# Patient Record
Sex: Male | Born: 1935 | Race: White | Hispanic: No | Marital: Married | Smoking: Current every day smoker
Health system: Southern US, Community
[De-identification: ages and names within clinical notes are randomized; demographics above are authoritative.]

## PROBLEM LIST (undated history)

## (undated) DIAGNOSIS — Z9289 Personal history of other medical treatment: Secondary | ICD-10-CM

## (undated) DIAGNOSIS — K649 Unspecified hemorrhoids: Secondary | ICD-10-CM

## (undated) DIAGNOSIS — L409 Psoriasis, unspecified: Secondary | ICD-10-CM

## (undated) DIAGNOSIS — D638 Anemia in other chronic diseases classified elsewhere: Secondary | ICD-10-CM

## (undated) DIAGNOSIS — Z923 Personal history of irradiation: Secondary | ICD-10-CM

## (undated) DIAGNOSIS — M419 Scoliosis, unspecified: Secondary | ICD-10-CM

## (undated) DIAGNOSIS — C20 Malignant neoplasm of rectum: Secondary | ICD-10-CM

## (undated) HISTORY — DX: Scoliosis, unspecified: M41.9

## (undated) HISTORY — DX: Malignant neoplasm of rectum: C20

---

## 2004-08-01 ENCOUNTER — Ambulatory Visit (HOSPITAL_COMMUNITY): Admission: RE | Admit: 2004-08-01 | Discharge: 2004-08-01 | Payer: Self-pay | Admitting: Ophthalmology

## 2006-09-13 ENCOUNTER — Ambulatory Visit (HOSPITAL_COMMUNITY): Admission: RE | Admit: 2006-09-13 | Discharge: 2006-09-14 | Payer: Self-pay | Admitting: Ophthalmology

## 2007-03-05 ENCOUNTER — Ambulatory Visit (HOSPITAL_COMMUNITY): Admission: RE | Admit: 2007-03-05 | Discharge: 2007-03-06 | Payer: Self-pay | Admitting: Ophthalmology

## 2010-08-30 NOTE — Op Note (Signed)
Richard [name], Richard [name]              ACCOUNT NO.:  [id number]   MEDICAL RECORD NO.:  [id number]          PATIENT TYPE:  AMB   LOCATION:  SDS                          FACILITY:  [facility]   PHYSICIAN:  John D. Ashley Royalty, M.D. DATE OF BIRTH:  [birth year]   DATE OF PROCEDURE:  09/13/2006  DATE OF DISCHARGE:                               OPERATIVE REPORT   ADMISSION DIAGNOSIS:  Preretinal fibrosis, right eye.   PROCEDURES:  1. Pars plana vitrectomy with membrane peel.  2. Intravitreal Kenalog injection.  3. Retinal photocoagulation.   SURGEON:  Beulah Gandy. Ashley Royalty, M.D.   ASSISTANT:  Rosalie Doctor, MA.   ANESTHESIA:  General.   DETAILS OF THE PROCEDURE:  The usual prep and drape.  The indirect  ophthalmoscope laser was moved into place, 864 burns placed around the  retinal periphery in two rows, the power was 1000 milliwatts, 1000  microns each and 0.1 seconds each.  Attention was carried to the pars  plana area where conjunctival peritomies were performed at 8, 10 and 2  o'clock.  The 6-mm infusion port anchored into place at 8 o'clock.  The  lighted pick and the cutter were placed at 10 and 2 o'clock  respectively.  Contact lens ring was anchored into place at 6 and 12  o'clock.  Provisc placed on the corneal surface.  The flat contact lens  was placed.  The pars plana vitrectomy was begun just behind the  pseudophakos, white membranes were encountered and carefully removed  under low suction and rapid cutting.  The vitrectomy is carried down to  the macular surface and the magnifying contact lens was used.  The  diamond dusted membrane scraper was used to engage the membrane and the  internal limiting membrane.  Small forceps were used to grasp the  membrane from its attachment to the macular region and peel it across  the foveal region.  The diamond dusted scraper was used to engage the  membrane as well and drag it into an area where it could be grasped with  forceps.  Once the entire  membrane was removed, the biome viewing system  was moved into place and the cutter was repositioned in the eye.  The  vitrectomy was carried out over the macular region and then out to the  periphery with the biome viewing system.  The vitrectomy is carried down  to the vitreous base for 360 degrees.  Intravitreal Kenalog 0.1 mL was  then injected over the macular region.  The instruments were removed  from the eye and 9-0 nylon was used to close the sclerotomy sites.  The  conjunctiva was closed with wet-field cautery.  Polymyxin and gentamicin  were irrigated into tenon space, Atropine solution was applied.  Marcaine was injected  around the globe for postop pain.  TobraDex ophthalmic ointment, a patch  and shield were placed, Decadron 10 mg was injected to the lower  subconjunctival space.  The closing pressure was 15 with a Baer  keratometer.  Complications none.  Duration 1 hour.  The patient is  awakened and taken to recovery in satisfactory  condition.      Beulah Gandy. Ashley Royalty, M.D.  Electronically Signed     JDM/MEDQ  D:  09/13/2006  T:  09/13/2006  Job:  [id number]

## 2010-08-30 NOTE — Op Note (Signed)
NAMEBRAYLYNN, Richard [name]              ACCOUNT NO.:  [id number]   MEDICAL RECORD NO.:  [id number]          PATIENT TYPE:  OIB   LOCATION:  5727                         FACILITY:  [facility]   PHYSICIAN:  John D. Ashley Royalty, M.D. DATE OF BIRTH:  31-Mar-1935   DATE OF PROCEDURE:  03/05/2007  DATE OF DISCHARGE:  03/06/2007                               OPERATIVE REPORT   ADMISSION DIAGNOSIS:  Preretinal fibrosis, right eye.   PROCEDURE:  Pars plana vitrectomy, with membrane peel, right eye and  intravitreal Kenalog injection right eye. Preretinal fibrosis, right  eye.   SURGEON:  Beulah Gandy. Ashley Royalty, M.D.   ASSISTANT:  Rosalie Doctor,  M.A.   ANESTHESIA:  General.   DETAILS:  Usual prep and drape.  Peritomies at 8, 10, and 2 o'clock.  The 5 mm infusion port anchored into place at 8 o'clock.  The lighted  pick and cutter placed at 10 and 2 o'clock respectively.  The contact  lens ring and Provisc was placed on the corneal surface.  The flat  contact lens was placed.  Pars plana vitrectomy was begun just behind  the pseudophakos.  Some vitreous debris was encountered and carefully  removed under low suction and rapid cutting.  Inspection of the  periphery showed previous laser marks and no breaks.  Attention was  carried to the macular region.  The magnifying contact lens was placed,  and the Eagle pick was used to engage the preretinal membrane.  ILM  forceps were used to grasp the membrane peel it from its attachments to  the macular a edge and the retina.  Kenalog was injected to mark the  ILM, and the ILM was removed with diamond-dusted membrane scraper, ILM  forceps, and the Eagle pick.  Once this was accomplished, all of the  Kenalog was removed, and macular surface was cleaned.  Additional 0.1 mL  of Kenalog was injected over the macular region.  The instruments were  removed from the eye, and 9-0 nylon was used to close the sclerotomy  sites.  The conjunctiva was closed with wet-field  cautery.  Polymyxin  and gentamicin were irrigated into Tenon's space.  Atropine solution was  applied.  Marcaine was injected around the globe for postop pain.  Closing pressure was 10 with a Risk manager.  0.1 mL of sterile air  was used to reinflate the globe.  The patient was awakened and taken to  recovery in satisfactory condition.   COMPLICATIONS:  None.   DURATION:  1 hour.      Beulah Gandy. Ashley Royalty, M.D.  Electronically Signed     JDM/MEDQ  D:  03/05/2007  T:  03/06/2007  Job:  [id number]

## 2011-01-24 LAB — BASIC METABOLIC PANEL
BUN: 8
Calcium: 9.4
Chloride: 106
Creatinine, Ser: 0.87
GFR calc non Af Amer: >60
Potassium: 4.9

## 2011-01-24 LAB — PROTIME-INR
INR: 0.8
Prothrombin Time: 11.6

## 2011-01-24 LAB — CBC
Hemoglobin: 14.4
WBC: 6

## 2011-01-24 LAB — APTT: aPTT: 29

## 2011-06-07 ENCOUNTER — Encounter (HOSPITAL_COMMUNITY): Payer: Self-pay | Admitting: Anesthesiology

## 2011-06-07 ENCOUNTER — Observation Stay (HOSPITAL_COMMUNITY)
Admission: EM | Admit: 2011-06-07 | Discharge: 2011-06-08 | Disposition: A | Discharge status: Home / Self Care | Payer: Medicare Other | Attending: Orthopedic Surgery | Admitting: Orthopedic Surgery

## 2011-06-07 ENCOUNTER — Other Ambulatory Visit: Payer: Self-pay

## 2011-06-07 ENCOUNTER — Emergency Department (HOSPITAL_COMMUNITY): Payer: Medicare Other

## 2011-06-07 ENCOUNTER — Emergency Department (HOSPITAL_COMMUNITY): Payer: Medicare Other | Admitting: Anesthesiology

## 2011-06-07 ENCOUNTER — Encounter (HOSPITAL_COMMUNITY)
Admission: EM | Disposition: A | Discharge status: Home / Self Care | Payer: Self-pay | Source: Home / Self Care | Attending: Emergency Medicine

## 2011-06-07 DIAGNOSIS — F17200 Nicotine dependence, unspecified, uncomplicated: Secondary | ICD-10-CM | POA: Insufficient documentation

## 2011-06-07 DIAGNOSIS — Y92009 Unspecified place in unspecified non-institutional (private) residence as the place of occurrence of the external cause: Secondary | ICD-10-CM | POA: Insufficient documentation

## 2011-06-07 DIAGNOSIS — IMO0002 Reserved for concepts with insufficient information to code with codable children: Principal | ICD-10-CM | POA: Insufficient documentation

## 2011-06-07 DIAGNOSIS — S68119A Complete traumatic metacarpophalangeal amputation of unspecified finger, initial encounter: Secondary | ICD-10-CM

## 2011-06-07 DIAGNOSIS — J4489 Other specified chronic obstructive pulmonary disease: Secondary | ICD-10-CM | POA: Insufficient documentation

## 2011-06-07 DIAGNOSIS — Y998 Other external cause status: Secondary | ICD-10-CM | POA: Insufficient documentation

## 2011-06-07 DIAGNOSIS — J449 Chronic obstructive pulmonary disease, unspecified: Secondary | ICD-10-CM | POA: Insufficient documentation

## 2011-06-07 DIAGNOSIS — W3189XA Contact with other specified machinery, initial encounter: Secondary | ICD-10-CM | POA: Insufficient documentation

## 2011-06-07 DIAGNOSIS — K08109 Complete loss of teeth, unspecified cause, unspecified class: Secondary | ICD-10-CM | POA: Insufficient documentation

## 2011-06-07 HISTORY — PX: AMPUTATION: SHX166

## 2011-06-07 HISTORY — PX: I & D EXTREMITY: SHX5045

## 2011-06-07 LAB — POCT I-STAT, CHEM 8
BUN: 9 mg/dL (ref 6–23)
HCT: 41 % (ref 39.0–52.0)
Hemoglobin: 13.9 g/dL (ref 13.0–17.0)
Potassium: 3.9 mEq/L (ref 3.5–5.1)

## 2011-06-07 LAB — DIFFERENTIAL
Basophils Absolute: 0 10*3/uL (ref 0.0–0.1)
Eosinophils Absolute: 0 10*3/uL (ref 0.0–0.7)
Eosinophils Relative: 0 % (ref 0–5)
Monocytes Absolute: 0.5 10*3/uL (ref 0.1–1.0)

## 2011-06-07 LAB — CBC
Hemoglobin: 13.6 g/dL (ref 13.0–17.0)
MCH: 33 pg (ref 26.0–34.0)
MCV: 95.4 fL (ref 78.0–100.0)
RBC: 4.12 MIL/uL — ABNORMAL LOW (ref 4.22–5.81)
RDW: 13.6 % (ref 11.5–15.5)

## 2011-06-07 SURGERY — IRRIGATION AND DEBRIDEMENT EXTREMITY
Anesthesia: General | Site: Finger | Laterality: Left | Wound class: Contaminated

## 2011-06-07 MED ORDER — PROPOFOL 10 MG/ML IV EMUL
INTRAVENOUS | Status: DC | PRN
  Administered 2011-06-07: 150 mg via INTRAVENOUS

## 2011-06-07 MED ORDER — ONDANSETRON HCL 4 MG/2ML IJ SOLN: 4.0000 mg | Freq: Four times a day (QID) | INTRAMUSCULAR | Status: DC | PRN

## 2011-06-07 MED ORDER — EPHEDRINE SULFATE 50 MG/ML IJ SOLN
INTRAMUSCULAR | Status: DC | PRN
  Administered 2011-06-07 (×3): 10 mg via INTRAVENOUS

## 2011-06-07 MED ORDER — ONDANSETRON HCL 4 MG/2ML IJ SOLN
4.0000 mg | Freq: Once | INTRAMUSCULAR | Status: AC
  Administered 2011-06-07: 4 mg via INTRAVENOUS
  Filled 2011-06-07: qty 2

## 2011-06-07 MED ORDER — DOCUSATE SODIUM 100 MG PO CAPS: 100.0000 mg | ORAL_CAPSULE | Freq: Two times a day (BID) | ORAL | Status: AC

## 2011-06-07 MED ORDER — CEFAZOLIN SODIUM 1-5 GM-% IV SOLN
1.0000 g | Freq: Once | INTRAVENOUS | Status: AC
  Administered 2011-06-07 (×2): 1 g via INTRAVENOUS
  Filled 2011-06-07: qty 50

## 2011-06-07 MED ORDER — OXYCODONE-ACETAMINOPHEN 5-325 MG PO TABS: 1.0000 | ORAL_TABLET | ORAL | Status: AC | PRN

## 2011-06-07 MED ORDER — MIDAZOLAM HCL 5 MG/5ML IJ SOLN
INTRAMUSCULAR | Status: DC | PRN
  Administered 2011-06-07: 2 mg via INTRAVENOUS

## 2011-06-07 MED ORDER — CEPHALEXIN 500 MG PO CAPS: 500.0000 mg | ORAL_CAPSULE | Freq: Four times a day (QID) | ORAL | Status: AC

## 2011-06-07 MED ORDER — LACTATED RINGERS IV SOLN
INTRAVENOUS | Status: DC | PRN
  Administered 2011-06-07 (×2): via INTRAVENOUS

## 2011-06-07 MED ORDER — SUCCINYLCHOLINE CHLORIDE 20 MG/ML IJ SOLN
INTRAMUSCULAR | Status: DC | PRN
  Administered 2011-06-07: 100 mg via INTRAVENOUS

## 2011-06-07 MED ORDER — FENTANYL CITRATE 0.05 MG/ML IJ SOLN
INTRAMUSCULAR | Status: DC | PRN
  Administered 2011-06-07 (×2): 50 ug via INTRAVENOUS
  Administered 2011-06-07: 100 ug via INTRAVENOUS
  Administered 2011-06-07: 50 ug via INTRAVENOUS

## 2011-06-07 MED ORDER — ONDANSETRON HCL 4 MG/2ML IJ SOLN
INTRAMUSCULAR | Status: DC | PRN
  Administered 2011-06-07: 4 mg via INTRAVENOUS

## 2011-06-07 MED ORDER — HYDROMORPHONE HCL PF 1 MG/ML IJ SOLN
1.0000 mg | Freq: Once | INTRAMUSCULAR | Status: AC
  Administered 2011-06-07: 1 mg via INTRAVENOUS
  Filled 2011-06-07: qty 1

## 2011-06-07 MED ORDER — BUPIVACAINE HCL (PF) 0.25 % IJ SOLN
INTRAMUSCULAR | Status: DC | PRN
  Administered 2011-06-07: 10 mL

## 2011-06-07 MED ORDER — HYDROMORPHONE HCL PF 1 MG/ML IJ SOLN: 0.2500 mg | INTRAMUSCULAR | Status: DC | PRN

## 2011-06-07 MED ORDER — SODIUM CHLORIDE 0.9 % IR SOLN
Status: DC | PRN
  Administered 2011-06-07: 1000 mL

## 2011-06-07 SURGICAL SUPPLY — 58 items
BANDAGE CONFORM 2  STR LF (GAUZE/BANDAGES/DRESSINGS) IMPLANT
BANDAGE ELASTIC 3 VELCRO ST LF (GAUZE/BANDAGES/DRESSINGS) ×2 IMPLANT
BANDAGE ELASTIC 4 VELCRO ST LF (GAUZE/BANDAGES/DRESSINGS) ×2 IMPLANT
BANDAGE GAUZE 4  KLING STR (GAUZE/BANDAGES/DRESSINGS) ×1 IMPLANT
BANDAGE GAUZE ELAST BULKY 4 IN (GAUZE/BANDAGES/DRESSINGS) ×2 IMPLANT
BLADE SURG 15 STRL LF DISP TIS (BLADE) IMPLANT
BLADE SURG 15 STRL SS (BLADE) ×4
BNDG CMPR 9X4 STRL LF SNTH (GAUZE/BANDAGES/DRESSINGS) ×1
BNDG COHESIVE 1X5 TAN STRL LF (GAUZE/BANDAGES/DRESSINGS) ×1 IMPLANT
BNDG ESMARK 4X9 LF (GAUZE/BANDAGES/DRESSINGS) ×2 IMPLANT
CLOTH BEACON ORANGE TIMEOUT ST (SAFETY) ×2 IMPLANT
CORDS BIPOLAR (ELECTRODE) ×2 IMPLANT
COVER SURGICAL LIGHT HANDLE (MISCELLANEOUS) ×2 IMPLANT
CUFF TOURNIQUET SINGLE 18IN (TOURNIQUET CUFF) ×2 IMPLANT
CUFF TOURNIQUET SINGLE 24IN (TOURNIQUET CUFF) IMPLANT
DRAIN PENROSE 1/4X12 LTX STRL (WOUND CARE) IMPLANT
DRAPE SURG 17X23 STRL (DRAPES) ×2 IMPLANT
DRSG ADAPTIC 3X8 NADH LF (GAUZE/BANDAGES/DRESSINGS) ×2 IMPLANT
ELECT REM PT RETURN 9FT ADLT (ELECTROSURGICAL)
ELECTRODE REM PT RTRN 9FT ADLT (ELECTROSURGICAL) IMPLANT
GAUZE SPONGE 4X4 12PLY STRL LF (GAUZE/BANDAGES/DRESSINGS) ×1 IMPLANT
GAUZE XEROFORM 1X8 LF (GAUZE/BANDAGES/DRESSINGS) ×2 IMPLANT
GAUZE XEROFORM 5X9 LF (GAUZE/BANDAGES/DRESSINGS) IMPLANT
GLOVE BIO SURGEON STRL SZ 6.5 (GLOVE) ×1 IMPLANT
GLOVE BIOGEL PI IND STRL 8.5 (GLOVE) ×1 IMPLANT
GLOVE BIOGEL PI INDICATOR 8.5 (GLOVE) ×1
GLOVE ECLIPSE 6.5 STRL STRAW (GLOVE) ×1 IMPLANT
GLOVE SURG ORTHO 8.0 STRL STRW (GLOVE) ×2 IMPLANT
GOWN PREVENTION PLUS XLARGE (GOWN DISPOSABLE) ×2 IMPLANT
GOWN STRL NON-REIN LRG LVL3 (GOWN DISPOSABLE) ×6 IMPLANT
HANDPIECE INTERPULSE COAX TIP (DISPOSABLE)
KIT BASIN OR (CUSTOM PROCEDURE TRAY) ×2 IMPLANT
KIT ROOM TURNOVER OR (KITS) ×2 IMPLANT
MANIFOLD NEPTUNE II (INSTRUMENTS) ×2 IMPLANT
NDL HYPO 25GX1X1/2 BEV (NEEDLE) IMPLANT
NEEDLE HYPO 25GX1X1/2 BEV (NEEDLE) ×2 IMPLANT
NS IRRIG 1000ML POUR BTL (IV SOLUTION) ×2 IMPLANT
PACK ORTHO EXTREMITY (CUSTOM PROCEDURE TRAY) ×2 IMPLANT
PAD ARMBOARD 7.5X6 YLW CONV (MISCELLANEOUS) ×4 IMPLANT
PAD CAST 4YDX4 CTTN HI CHSV (CAST SUPPLIES) ×1 IMPLANT
PADDING CAST COTTON 4X4 STRL (CAST SUPPLIES) ×2
SET HNDPC FAN SPRY TIP SCT (DISPOSABLE) IMPLANT
SOAP 2 % CHG 4 OZ (WOUND CARE) ×2 IMPLANT
SPONGE GAUZE 4X4 12PLY (GAUZE/BANDAGES/DRESSINGS) ×2 IMPLANT
SPONGE LAP 18X18 X RAY DECT (DISPOSABLE) ×2 IMPLANT
SPONGE LAP 4X18 X RAY DECT (DISPOSABLE) ×2 IMPLANT
SUCTION FRAZIER TIP 10 FR DISP (SUCTIONS) ×2 IMPLANT
SUT ETHILON 4 0 PS 2 18 (SUTURE) ×3 IMPLANT
SUT ETHILON 5 0 P 3 18 (SUTURE)
SUT NYLON ETHILON 5-0 P-3 1X18 (SUTURE) ×1 IMPLANT
SYR CONTROL 10ML LL (SYRINGE) ×1 IMPLANT
TOWEL OR 17X24 6PK STRL BLUE (TOWEL DISPOSABLE) ×2 IMPLANT
TOWEL OR 17X26 10 PK STRL BLUE (TOWEL DISPOSABLE) ×2 IMPLANT
TUBE ANAEROBIC SPECIMEN COL (MISCELLANEOUS) IMPLANT
TUBE CONNECTING 12X1/4 (SUCTIONS) ×2 IMPLANT
UNDERPAD 30X30 INCONTINENT (UNDERPADS AND DIAPERS) ×2 IMPLANT
WATER STERILE IRR 1000ML POUR (IV SOLUTION) ×2 IMPLANT
YANKAUER SUCT BULB TIP NO VENT (SUCTIONS) ×2 IMPLANT

## 2011-06-07 NOTE — Brief Op Note (Signed)
06/07/2011  10:23 PM  PATIENT:  Richard [name]  77 y.o. 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  POST-OPERATIVE DIAGNOSIS:  Revision of multiple amputation of index and long finger  PROCEDURE:  Procedure(s) (LRB): IRRIGATION AND DEBRIDEMENT EXTREMITY (Left) AMPUTATION DIGIT (Left)  SURGEON:  Surgeon(s) and Role:    * Sharma Covert, MD - Primary  PHYSICIAN ASSISTANT:   ASSISTANTS: none   ANESTHESIA:   general  EBL:  Total I/O In: 1000 [I.V.:1000] Out: 20 [Blood:20]  BLOOD ADMINISTERED:none  DRAINS: none   LOCAL MEDICATIONS USED:  MARCAINE     SPECIMEN:  No Specimen  DISPOSITION OF SPECIMEN:  N/A  COUNTS:  YES  TOURNIQUET:   Total Tourniquet Time Documented: Upper Arm (Left) - 46 minutes  DICTATION: .Other Dictation: Dictation Number [id number]  PLAN OF CARE: Admit for overnight observation  PATIENT DISPOSITION:  PACU - hemodynamically stable.   Delay start of Pharmacological VTE agent (>24hrs) due to surgical blood loss or risk of bleeding: not applicable

## 2011-06-07 NOTE — Anesthesia Postprocedure Evaluation (Signed)
Anesthesia Post Note  Patient: Richard [name]  Procedure(s) Performed: Procedure(s) (LRB): IRRIGATION AND DEBRIDEMENT EXTREMITY (Left) AMPUTATION DIGIT (Left)  Anesthesia type: General  Patient location: PACU  Post pain: Pain level controlled and Adequate analgesia  Post assessment: Post-op Vital signs reviewed, Patient's Cardiovascular Status Stable, Respiratory Function Stable, Patent Airway and Pain level controlled  Last Vitals:  Filed Vitals:   06/07/11 2230  BP: 115/62  Pulse: 77  Temp:   Resp: 22    Post vital signs: Reviewed and stable  Level of consciousness: awake, alert  and oriented  Complications: No apparent anesthesia complications

## 2011-06-07 NOTE — ED Provider Notes (Signed)
Patient accidentally injured his fingers of the right hand when a wood splitter, impacted them. This is an isolated injury. He did not hurt his wrist, shoulder or elbow. He has no further complaints at this time. Medical screening examination/treatment/procedure(s) were conducted as a shared visit with non-physician practitioner(s) and myself.  I personally evaluated the patient during the encounter  Flint Melter, MD 06/07/11 2040

## 2011-06-07 NOTE — Anesthesia Preprocedure Evaluation (Addendum)
Anesthesia Evaluation  Patient identified by MRN, date of birth, ID band Patient awake    Reviewed: Allergy & Precautions, H&P , NPO status   Airway Mallampati: I TM Distance: >3 FB Neck ROM: full    Dental  (+) Edentulous Upper and Edentulous Lower   Pulmonary COPDCurrent Smoker,          Cardiovascular     Neuro/Psych    GI/Hepatic   Endo/Other    Renal/GU      Musculoskeletal   Abdominal   Peds  Hematology   Anesthesia Other Findings   Reproductive/Obstetrics                        Anesthesia Physical Anesthesia Plan  ASA: II and Emergent  Anesthesia Plan: General   Post-op Pain Management:    Induction:   Airway Management Planned:   Additional Equipment:   Intra-op Plan:   Post-operative Plan:   Informed Consent: I have reviewed the patients History and Physical, chart, labs and discussed the procedure including the risks, benefits and alternatives for the proposed anesthesia with the patient or authorized representative who has indicated his/her understanding and acceptance.     Plan Discussed with: Anesthesiologist and CRNA  Anesthesia Plan Comments:         Anesthesia Quick Evaluation

## 2011-06-07 NOTE — Discharge Instructions (Signed)
KEEP BANDAGE CLEAN AND DRY °CALL OFFICE FOR F/U APPT [phone] in 7 days °KEEP HAND ELEVATED ABOVE HEART °OK TO APPLY ICE TO OPERATIVE AREA °CONTACT OFFICE IF ANY WORSENING PAIN OR CONCERNS. °

## 2011-06-07 NOTE — ED Notes (Signed)
Pt transported to the OR via stretcher.

## 2011-06-07 NOTE — H&P (Signed)
Richard [name] is an 77 y.o. male.   Chief Complaint: LEFT INDEX AND LONG FINGER INJURIES FROM WOOD SPLITTER HPI: PT PRESENTED TO ED WITH AMPUTATED PARTS ACCOMPANIED BY DAUGHTER TETANUS 2 YEARS AGO +SMOKER  No past medical history on file.  No past surgical history on file.  No family history on file. Social History:  does not have a smoking history on file. He does not have any smokeless tobacco history on file. His alcohol and drug histories not on file.  Allergies: No Known Allergies  Medications Prior to Admission  Medication Dose Route Frequency Provider Last Rate Last Dose  . ceFAZolin (ANCEF) IVPB 1 g/50 mL premix  1 g Intravenous Once Arman Filter, NP      . HYDROmorphone (DILAUDID) injection 1 mg  1 mg Intravenous Once Arman Filter, NP   1 mg at 06/07/11 2002  . ondansetron (ZOFRAN) injection 4 mg  4 mg Intravenous Once Arman Filter, NP   4 mg at 06/07/11 2002   No current outpatient prescriptions on file as of 06/07/2011.    No results found for this or any previous visit (from the past 48 hour(s)). No results found.  NO RECENT ILLNESSES OR HOSPITALIZATIONS  Blood pressure 113/67, pulse 69, temperature 98.4 F (36.9 C), temperature source Oral, resp. rate 18, SpO2 98.00%. General Appearance:  Alert, cooperative, no distress, appears stated age  Head:  Normocephalic, without obvious abnormality, atraumatic  Eyes:  Pupils equal, conjunctiva/corneas clear,         Throat: Lips, mucosa, and tongue normal; teeth and gums normal  Neck: No visible masses     Lungs:   respirations unlabored  Chest Wall:  No tenderness or deformity  Heart:  Regular rate and rhythm,  Abdomen:   Soft, non-tender,         Extremities: LEFT INDEX FINGER AMPUTATED PROXIMAL TO FDS INSERTION. OBLIQUE LACERATION WITH EXPOSED P1. LEFT LONG FINGER DISTAL AMPUTATION THROUGH DISTAL PHALANX ABLE TO FLEX DIP AND PIP JOINT NO INJURY TO RING  OR SMALL, THUMB  Pulses: 2+ and symmetric  Skin: Skin  color, texture, turgor normal, no rashes or lesions     Neurologic: Normal    Assessment/Plan LEFT INDEX AND LONG FINGER TRAUMATIC AMPUTATIONS  TO OR FOR REVISION AMPUTATIONS, LOCAL NEURECTOMIES AND ADVANCEMENT FLAP CLOSURE  R/B/A DISCUSSED WITH PT IN ED.  PT VOICED UNDERSTANDING OF PLAN CONSENT SIGNED DAY OF SURGERY PT SEEN AND EXAMINED PRIOR TO OPERATIVE PROCEDURE/DAY OF SURGERY SITE MARKED. QUESTIONS ANSWERED WILL BE ADMITTED  FOLLOWING SURGERY  Sharma Covert 06/07/2011, 8:15 PM

## 2011-06-07 NOTE — ED Notes (Signed)
Per EMS; pt was at home splitting wood, and cut off end of L index and L middle finger; bleeding controlled; fingers brought on ice; 100/60 BP; HR 75, SR; pt reports drinking four beers tonight; 18g RAC;

## 2011-06-07 NOTE — Transfer of Care (Signed)
Immediate Anesthesia Transfer of Care Note  Patient: Richard [name]  Procedure(s) Performed: Procedure(s) (LRB): IRRIGATION AND DEBRIDEMENT EXTREMITY (Left) AMPUTATION DIGIT (Left)  Patient Location: PACU  Anesthesia Type: General  Level of Consciousness: alert  and oriented  Airway & Oxygen Therapy: Patient Spontanous Breathing and Patient connected to nasal cannula oxygen  Post-op Assessment: Report given to PACU RN and Post -op Vital signs reviewed and stable  Post vital signs: stable  Complications: No apparent anesthesia complications

## 2011-06-07 NOTE — ED Provider Notes (Signed)
History     CSN: [id number]  Arrival date & time 06/07/11  4132   First MD Initiated Contact with Patient 06/07/11 1947      No chief complaint on file.   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  He now has an amputation of his left index finger at the PIP joint on an angle, as well as the tip of the middle finger at the PIP joint on an angle.  He does have a long piece from the index finger in paper towels on ice.  His last tetanus shot was approximately 2 years ago  The history is provided by the patient.    No past medical history on file.  No past surgical history on file.  No family history on file.  History  Substance Use Topics  . Smoking status: Current Everyday Smoker -- 0.5 packs/day  . Smokeless tobacco: Not on file  . Alcohol Use: 3.0 oz/week    5 Cans of beer per week      Review of Systems  Constitutional: Negative for fever.  Skin: Positive for wound.  Neurological: Negative for dizziness.    Allergies  Review of patient's allergies indicates no known allergies.  Home Medications   Current Outpatient Rx  Name Route Sig Dispense Refill  . CEPHALEXIN 500 MG PO CAPS Oral Take 1 capsule (500 mg total) by mouth 4 (four) times daily. 40 capsule 0  . DOCUSATE SODIUM 100 MG PO CAPS Oral Take 1 capsule (100 mg total) by mouth 2 (two) times daily. 25 capsule 0  . OXYCODONE-ACETAMINOPHEN 5-325 MG PO TABS Oral Take 1 tablet by mouth every 4 (four) hours as needed for pain. 45 tablet 0    BP 109/66  Pulse 76  Temp(Src) 97.2 F (36.2 C) (Oral)  Resp 16  SpO2 98%  Physical Exam  Constitutional: He is oriented to person, place, and time. He appears well-developed and well-nourished.  HENT:  Head: Normocephalic.  Eyes: Pupils are equal, round, and reactive to light.    Neck: Normal range of motion.  Cardiovascular: Normal rate.   Pulmonary/Chest: Effort normal.  Musculoskeletal:       Hands:      amputations  Neurological: He is alert and oriented to person, place, and time.  Skin: Skin is warm and dry.    ED Course  Procedures (including critical care time)  Labs Reviewed  CBC - Abnormal; Notable for the following:    RBC 4.12 (*)    All other components within normal limits  POCT I-STAT, CHEM 8 - Abnormal; Notable for the following:    Calcium, Ion 1.09 (*)    All other components within normal limits  DIFFERENTIAL   Dg Chest 1 View  06/07/2011  *RADIOLOGY REPORT*  Clinical Data: Trauma patient.  Two fingers were severed. Preoperative.  CHEST - 1 VIEW  Comparison: 03/05/2007  Findings: Chronic ossifications noted projecting over the expected location of the right coracoclavicular ligament.  Thoracic spondylosis is present.  There is mild atelectasis along the left hemidiaphragm.  Gas density below the left hemidiaphragm is probably within bowel and stomach - if the patient had significant abdominal trauma recently then I would recommend a left side down lateral decubitus view the abdomen to exclude free air.  The lungs appear otherwise clear.  Heart size is within normal limits.  IMPRESSION:  1.  Mild atelectasis along the left hemidiaphragm. 2.  Double contour of gas below the left hemidiaphragm is probably from bowel and adjacent stomach.  If the patient had recent significant abdominal trauma, consider a left side down lateral decubitus view of the abdomen to exclude the unlikely possibility of free intraperitoneal gas. 3.  Thoracic spondylosis.  Original Report Authenticated By: Dellia Cloud, M.D.   Dg Hand Complete Left  06/07/2011  *RADIOLOGY REPORT*  Clinical Data: Trauma patient with a second and third digit amputation.  LEFT HAND - COMPLETE 3+ VIEW  Comparison: None.  Findings: Post-traumatic amputation of the second finger at the  level of the proximal metaphysis of the middle phalanx is observed.  A traumatic amputation of the third finger at the level of the proximal metaphysis of the distal phalanx is present.  Overlying bandaging noted.  Deformity of the ulnar styloid may be from prior fracture, correlate with patient history.  Ossific structure adjacent to the distal metaphysis of the first metacarpal may be from spurring or osteochondroma.  IMPRESSION:  1.  Amputation of the second and third fingers. 2.  Spur or exostosis in the vicinity of the distal metaphysis of the first metacarpal. 3.  Remote post-traumatic deformity of the ulnar styloid.  Original Report Authenticated By: Dellia Cloud, M.D.     1. Finger amputation, traumatic    ED ECG REPORT   Date: 06/08/2011  EKG Time: 12:21 AM  Rate:65  Rhythm: normal sinus rhythm,  normal EKG, normal sinus rhythm, unchanged from previous tracings  Axisnormal  Intervals:none  ST&T Change: RSR prime   Narrative Interpretation: normal           Spoke with Dr. Orlan Leavens, who will consult  MDM  amputations        Arman Filter, NP 06/07/11 2006  Arman Filter, NP 06/07/11 2009  Arman Filter, NP 06/08/11 4540

## 2011-06-08 ENCOUNTER — Encounter (HOSPITAL_COMMUNITY): Payer: Self-pay | Admitting: *Deleted

## 2011-06-08 MED ORDER — VITAMIN C 500 MG PO TABS
1000.0000 mg | ORAL_TABLET | Freq: Every day | ORAL | Status: DC
  Filled 2011-06-08: qty 2

## 2011-06-08 MED ORDER — MORPHINE SULFATE 2 MG/ML IJ SOLN: 1.0000 mg | INTRAMUSCULAR | Status: DC | PRN

## 2011-06-08 MED ORDER — HYDROCODONE-ACETAMINOPHEN 5-325 MG PO TABS
1.0000 | ORAL_TABLET | ORAL | Status: DC | PRN
  Administered 2011-06-08 (×2): 2 via ORAL
  Filled 2011-06-08 (×2): qty 2

## 2011-06-08 MED ORDER — DIPHENHYDRAMINE HCL 25 MG PO CAPS: 25.0000 mg | ORAL_CAPSULE | Freq: Four times a day (QID) | ORAL | Status: DC | PRN

## 2011-06-08 MED ORDER — CEFAZOLIN SODIUM 1-5 GM-% IV SOLN: 1.0000 g | INTRAVENOUS | Status: DC

## 2011-06-08 MED ORDER — CEFAZOLIN SODIUM 1-5 GM-% IV SOLN: 1.0000 g | Freq: Three times a day (TID) | INTRAVENOUS | Status: DC

## 2011-06-08 MED ORDER — METHOCARBAMOL 100 MG/ML IJ SOLN
500.0000 mg | Freq: Four times a day (QID) | INTRAVENOUS | Status: DC | PRN
  Filled 2011-06-08: qty 5

## 2011-06-08 MED ORDER — METHOCARBAMOL 500 MG PO TABS
500.0000 mg | ORAL_TABLET | Freq: Four times a day (QID) | ORAL | Status: DC | PRN
  Administered 2011-06-08: 500 mg via ORAL
  Filled 2011-06-08: qty 1

## 2011-06-08 MED ORDER — ONDANSETRON HCL 4 MG/2ML IJ SOLN: 4.0000 mg | Freq: Four times a day (QID) | INTRAMUSCULAR | Status: DC | PRN

## 2011-06-08 MED ORDER — DOCUSATE SODIUM 100 MG PO CAPS
100.0000 mg | ORAL_CAPSULE | Freq: Two times a day (BID) | ORAL | Status: DC
  Administered 2011-06-08: 100 mg via ORAL
  Filled 2011-06-08 (×2): qty 1

## 2011-06-08 MED ORDER — OXYCODONE-ACETAMINOPHEN 5-325 MG PO TABS: 1.0000 | ORAL_TABLET | ORAL | Status: DC | PRN

## 2011-06-08 MED ORDER — ONDANSETRON HCL 4 MG PO TABS: 4.0000 mg | ORAL_TABLET | Freq: Four times a day (QID) | ORAL | Status: DC | PRN

## 2011-06-08 MED ORDER — ADULT MULTIVITAMIN W/MINERALS CH
1.0000 | ORAL_TABLET | Freq: Every day | ORAL | Status: DC
  Filled 2011-06-08: qty 1

## 2011-06-08 NOTE — ED Provider Notes (Signed)
Medical screening examination/treatment/procedure(s) were conducted as a shared visit with non-physician practitioner(s) and myself.  I personally evaluated the patient during the encounter  Flint Melter, MD 06/08/11 [phone]

## 2011-06-08 NOTE — Op Note (Signed)
NAMEWALDEN, STATZ NO.:  [id number]  MEDICAL RECORD NO.:  [id number]  LOCATION:  5037                         FACILITY:  [facility]  PHYSICIAN:  Madelynn Done, MD  DATE OF BIRTH:  07/22/1934  DATE OF PROCEDURE:  06/07/2011 DATE OF DISCHARGE:                              OPERATIVE REPORT   PREOPERATIVE DIAGNOSES: 1. Left index finger amputation through the level of the proximal     interphalangeal joint. 2. Left long finger amputation through the level of the distal     interphalangeal joint.  POSTOPERATIVE DIAGNOSES: 1. Left index finger amputation through the level of the proximal     interphalangeal joint. 2. Left long finger amputation through the level of the distal     interphalangeal joint.  ANESTHESIA:  General via LMA.  TOURNIQUET TIME:  46 minutes at 250 mmHg.  SURGICAL PROCEDURE: 1. Left index finger revision amputation with local neurectomies and     advancement flap closure. 2. Left long finger revision amputation with local neurectomies and     advancement flap closure.  SURGICAL INDICATION:  Mr. Knoblock is a 77 year old gentleman who cut his 2 fingers, amputated in a wood splinter.  These were not re- plantable digits.  The patient presented to the ER, was recommended that he undergo the above procedure.  Risks, benefits, and alternatives were discussed in detail with the patient and signed informed consent was obtained.  Risks include, but not limited to bleeding, infection, damage to nearby nerves, arteries, or tendons, loss of motion of the wrist and digits, and need for further surgical intervention.  DESCRIPTION OF PROCEDURE:  The patient was properly identified in the preop holding area and mark with a permanent marker made on left index and long finger to indicate the correct operative site.  The patient was then brought back to the operating room, placed supine on the anesthesia room table where general anesthesia was  administered.  The patient received preop antibiotics.  Well-padded tourniquet was then placed on the left brachium and sealed with 1000 drape.  Left upper extremity was prepped and draped in normal sterile fashion.  Time-out was called, correct side was identified, and procedure then begun.  Attention was then turned left long finger, index finger where the amputation was then revised, amputation was completed through the level of the middle phalanx just proximal to the FDS insertion.  Completion amputation was then carried out through the level of the proximal phalanx in order to obtain wound closure.  Local neurectomy was then carried out both radially and ulnarly.  The tendons were allowed to retract proximally. Following this, a rotational flap was then created from a volar to dorsal direction in V-Y fashion in order to obtain closure.  The wound had been thoroughly irrigated.  The skin was then closed using simple nylon sutures.  Attention was then turned to the long finger using the similar technique.  Amputation was then completed and local neurectomies were then carried out.  Tendons were allowed to retract and rotational flap V-Y flap was then carried out in order to obtain closure with simple sutures.  Adaptic dressing was then applied.  Sterile compressive bandage  was then applied.  The patient tolerated the procedure well and taken to the recovery room in good condition.  POSTPROCEDURE PLAN:  The patient will be admitted for IV antibiotics and pain control.  Discharged in the morning, seen back in the office for wound check, and then a small tip protector splints.  Gradual use and activity.     Madelynn Done, MD     FWO/MEDQ  D:  06/07/2011  T:  06/08/2011  Job:  [id number]

## 2011-06-09 NOTE — Discharge Summary (Signed)
Physician Discharge Summary  Patient ID: Richard [name] MRN: [id number] DOB/AGE: [birth year] 77 y.o.  Admit date: 06/07/2011 Discharge date: 06/09/2011  Admission Diagnoses:Left index and long finger amputations   Discharge Diagnoses: Left index and long finger amputations Active Problems:  * No active hospital problems. *    Discharged Condition: good  Hospital Course: none  Consults: None  Significant Diagnostic Studies: none  Treatments: OR on day of admission  Discharge Exam: Blood pressure 121/69, pulse 74, temperature 99 F (37.2 C), temperature source Tympanic, resp. rate 18, SpO2 98.00%.  Disposition: 01-Home or Self Care  Discharge Orders    Future Orders Please Complete By Expires   Discharge patient      Comments:   In am if tolerating po     Medication List  As of 06/09/2011  7:34 PM   TAKE these medications         cephALEXin 500 MG capsule   Commonly known as: KEFLEX   Take 1 capsule (500 mg total) by mouth 4 (four) times daily.      docusate sodium 100 MG capsule   Commonly known as: COLACE   Take 1 capsule (100 mg total) by mouth 2 (two) times daily.      oxyCODONE-acetaminophen 5-325 MG per tablet   Commonly known as: PERCOCET   Take 1 tablet by mouth every 4 (four) hours as needed for pain.           Follow-up Information    Follow up with Sharma Covert, MD in 7 days.   Contact information:   [facility] [address] [place] [address] [phone]          Signed: Sharma Covert 06/09/2011, 7:34 PM

## 2011-06-11 ENCOUNTER — Encounter (HOSPITAL_COMMUNITY): Payer: Self-pay | Admitting: Orthopedic Surgery

## 2013-05-29 HISTORY — PX: COLONOSCOPY W/ BIOPSIES: SHX1374

## 2013-06-12 ENCOUNTER — Ambulatory Visit (INDEPENDENT_AMBULATORY_CARE_PROVIDER_SITE_OTHER): Payer: Medicare Other | Admitting: Surgery

## 2013-06-16 ENCOUNTER — Encounter (INDEPENDENT_AMBULATORY_CARE_PROVIDER_SITE_OTHER): Payer: Self-pay | Admitting: Surgery

## 2013-06-16 ENCOUNTER — Ambulatory Visit (INDEPENDENT_AMBULATORY_CARE_PROVIDER_SITE_OTHER): Payer: Medicare Other | Admitting: Surgery

## 2013-06-16 ENCOUNTER — Encounter (INDEPENDENT_AMBULATORY_CARE_PROVIDER_SITE_OTHER): Payer: Self-pay

## 2013-06-16 ENCOUNTER — Telehealth (INDEPENDENT_AMBULATORY_CARE_PROVIDER_SITE_OTHER): Payer: Self-pay

## 2013-06-16 VITALS — BP 126/82 | HR 70 | Resp 16 | Ht 71.5 in | Wt 156.0 lb

## 2013-06-16 DIAGNOSIS — M6200 Separation of muscle (nontraumatic), unspecified site: Secondary | ICD-10-CM

## 2013-06-16 DIAGNOSIS — M6208 Separation of muscle (nontraumatic), other site: Secondary | ICD-10-CM

## 2013-06-16 DIAGNOSIS — M4120 Other idiopathic scoliosis, site unspecified: Secondary | ICD-10-CM

## 2013-06-16 DIAGNOSIS — C20 Malignant neoplasm of rectum: Secondary | ICD-10-CM

## 2013-06-16 DIAGNOSIS — M419 Scoliosis, unspecified: Secondary | ICD-10-CM

## 2013-06-16 DIAGNOSIS — Z720 Tobacco use: Secondary | ICD-10-CM

## 2013-06-16 DIAGNOSIS — F17200 Nicotine dependence, unspecified, uncomplicated: Secondary | ICD-10-CM

## 2013-06-16 HISTORY — DX: Malignant neoplasm of rectum: C20

## 2013-06-16 NOTE — Telephone Encounter (Signed)
I have tried calling this pm but n/a. I have pt scheduled for several appt's I need to speak to them when they return my call. I will try again tomorrow.

## 2013-06-16 NOTE — Progress Notes (Signed)
Subjective:     Patient ID: Richard [name], male   DOB: [birth year], 79 y.o.   MRN: [id number]  HPI  Note: This dictation was prepared with Dragon/digital dictation along with [facility] technology. Any transcriptional errors that result from this process are unintentional.       Richard [name]  [birth year] [id number]  Patient Care Team: Leonard Downing, MD as PCP - General (Family Medicine) Adin Hector, MD as Consulting Physician (General Surgery) Wonda Horner, MD as Consulting Physician (Gastroenterology) Linna Hoff, MD as Consulting Physician (Orthopedic Surgery) Hayden Pedro, MD as Consulting Physician (Ophthalmology)  This patient is a 79 y.o.male who presents today for surgical evaluation at the request of Dr. Penelope Coop.   Reason for visit: Rectal cancer  Pleasant gentleman.  Comes today with his wife.  Both heavy smokers.  Normally has a bowel movement every day for his life.  However, it began to have some fecal urgency and bleeding. Started around Thanksgiving.  History of chronic hemorrhoids.  Thought it was that.  Worsened.  Urgency with many loose bowel movements.  Based on concerns, underwent colonoscopy.  Have small polyps proximally.  Large mid rectal mass 10 cm long.  Biopsy consistent with adenocarcinoma.  Surgical consultation requested.  No personal nor family history of GI/colon cancer, inflammatory bowel disease, irritable bowel syndrome, allergy such as Celiac Sprue, dietary/dairy problems, colitis, ulcers nor gastritis.  No recent sick contacts/gastroenteritis.  No travel outside the country.  No changes in diet.  No dysphagia to solids or liquids.  No significant heartburn or reflux.  No hematochezia, hematemesis, coffee ground emesis.  No evidence of prior gastric/peptic ulceration.   No exertional chest/neck/shoulder/arm pain.  Patient can walk 60 minutes for about 2 miles without difficulty.   No history of heart attack or stroke.  He is hesitant to  consider smoking cessation     Patient Active Problem List   Diagnosis Date Noted  . Rectal adenocarcinoma (7cm posterior) 06/16/2013  . Tobacco abuse 06/16/2013  . Kyphoscoliosis deformity of spine     Past Medical History  Diagnosis Date  . Rectal adenocarcinoma (7cm posterior) 06/16/2013  . Kyphoscoliosis deformity of spine     Past Surgical History  Procedure Laterality Date  . I&d extremity  06/07/2011    Procedure: IRRIGATION AND DEBRIDEMENT EXTREMITY;  Surgeon: Linna Hoff, MD;  Location: [place];  Service: Orthopedics;  Laterality: Left;  . Amputation  06/07/2011    Procedure: AMPUTATION DIGIT;  Surgeon: Linna Hoff, MD;  Location: [place];  Service: Orthopedics;  Laterality: Left;  Revision multiple fingers index and middle     History   Social History  . Marital Status: Married    Spouse Name: N/A    Number of Children: N/A  . Years of Education: N/A   Occupational History  . Not on file.   Social History Main Topics  . Smoking status: Current Every Day Smoker -- 0.50 packs/day  . Smokeless tobacco: Not on file  . Alcohol Use: 3.0 oz/week    5 Cans of beer per week  . Drug Use: No  . Sexual Activity:    Other Topics Concern  . Not on file   Social History Narrative  . No narrative on file    No family history on file.  No current outpatient prescriptions on file.   No current facility-administered medications for this visit.     No Known Allergies  BP 126/82  Pulse 70  Resp  16  Ht 5' 11.5" (1.816 m)  Wt 156 lb (70.761 kg)  BMI 21.46 kg/m2  No results found.   Review of Systems  Constitutional: Positive for unexpected weight change. Negative for fever, chills and diaphoresis.  HENT: Negative for ear discharge, facial swelling, mouth sores, nosebleeds, sore throat and trouble swallowing.   Eyes: Negative for photophobia, discharge and visual disturbance.  Respiratory: Negative for choking, chest tightness, shortness of breath and  stridor.   Cardiovascular: Negative for chest pain and palpitations.  Gastrointestinal: Positive for diarrhea, blood in stool and anal bleeding. Negative for nausea, vomiting, abdominal pain, constipation and abdominal distention.  Endocrine: Negative for cold intolerance and heat intolerance.  Genitourinary: Negative for dysuria, urgency, difficulty urinating and testicular pain.  Musculoskeletal: Negative for arthralgias, back pain, gait problem and myalgias.  Skin: Negative for color change, pallor, rash and wound.  Allergic/Immunologic: Negative for environmental allergies and food allergies.  Neurological: Negative for dizziness, speech difficulty, weakness, numbness and headaches.  Hematological: Negative for adenopathy. Does not bruise/bleed easily.  Psychiatric/Behavioral: Negative for hallucinations, confusion and agitation.       Objective:   Physical Exam  Constitutional: He is oriented to person, place, and time. He appears well-developed. He appears cachectic. He is cooperative.  Non-toxic appearance. He does not have a sickly appearance. No distress.  HENT:  Head: Normocephalic.  Mouth/Throat: Oropharynx is clear and moist. No oropharyngeal exudate.  Eyes: Conjunctivae and EOM are normal. Pupils are equal, round, and reactive to light. No scleral icterus.  Neck: Trachea normal. Rigidity present. No tracheal deviation present.    Cardiovascular: Normal rate, regular rhythm and intact distal pulses.   Pulmonary/Chest: Effort normal and breath sounds normal. No stridor. No respiratory distress.  Abdominal: Soft. He exhibits no distension. There is no tenderness. Hernia confirmed negative in the right inguinal area and confirmed negative in the left inguinal area.    Genitourinary: Rectal exam shows mass and tenderness. Guaiac positive stool.  Exam done with assistance of male Medical Assistant in the room.  Perianal skin clean with good hygiene.  No pruritis.  No pilonidal  disease.  No fissure.  No abscess/fistula.  External hemorrhoids moderate.  Normal sphincter tone.  Tolerates digital rectal exam. Hemorrhoidal piles mild/mod enlarged  Obvious fixed fungating rectal mass, most distal margin ~7cm proximal to anal sphincter, posterior midline 1/3 circumference.  Sensitive.   Musculoskeletal: Normal range of motion. He exhibits no tenderness.       Hands: Lymphadenopathy:       Head (right side): No submental, no submandibular, no posterior auricular and no occipital adenopathy present.       Head (left side): No submental, no submandibular, no posterior auricular and no occipital adenopathy present.    He has no cervical adenopathy.    He has no axillary adenopathy.       Right: No inguinal and no supraclavicular adenopathy present.       Left: No inguinal and no supraclavicular adenopathy present.  Neurological: He is alert and oriented to person, place, and time. No cranial nerve deficit. He exhibits normal muscle tone. Coordination normal.  Skin: Skin is warm and dry. No rash noted. No erythema. No pallor.  Psychiatric: He has a normal mood and affect. His behavior is normal. Judgment and thought content normal.       Assessment:     Bulky mid to proximal rectal cancer and elderly smoking male     Plan:     I spent over  an hour reviewing his chart, studies, history, and physical.  Staging information.  Obtain CEA, CT chest/abdomen/pelvis.  Endoscopic ultrasound to stage cancer.  Strongly suspicious for at least T3.  Probable neoadjuvant chemoradiation therapy.  Setup medical/radiation oncology appointments.  Discuss at GI tumor board as well  If no evidence of frank metastatic disease, he would benefit from surgical segmental resection.  Regional perform minimally invasive (robotic/laparoscopic).  Suspect he will need diverting loop ileostomy to protect the anastomosis as it will be within 5 cm of the anal verge to get good margins:  The anatomy  & physiology of the digestive tract was discussed.  The pathophysiology was discussed.  Natural history risks without surgery was discussed.   I worked to give an overview of the disease and the frequent need to have multispecialty involvement.  I feel the risks of no intervention will lead to serious problems that outweigh the operative risks; therefore, I recommended a partial proctocolectomy to remove the pathology.  Minimally Invasive (Robotic/Laparoscopic) & open techniques were discussed.  We will work to preserve anal & pelvic floor function without sacrificing cure.  Risks such as bleeding, infection, abscess, leak, reoperation, possible ostomy, hernia, heart attack, death, and other risks were discussed.  I noted a good likelihood this will help address the problem.   Goals of post-operative recovery were discussed as well.  We will work to minimize complications.  An educational handout on the pathology was given as well.  Questions were answered.    The patient & his wife express understanding & wishes to proceed with workup & surgery.   I strongly encouraged him to quit smoking.  He told me he admits "all my life".  He is hesitant to think he will be successful in doing that.  We talked to the patient about the dangers of smoking.  We stressed that tobacco use dramatically increases the risk of peri-operative complications such as infection, tissue necrosis leaving to problems with incision/wound and organ healing, hernia, chronic pain, heart attack, stroke, DVT, pulmonary embolism, and death.  We noted there are programs in our community to help stop smoking.  Information was available.

## 2013-06-16 NOTE — Addendum Note (Signed)
Addended by: Illene Regulus on: 06/16/2013 04:24 PM   Modules accepted: Orders

## 2013-06-16 NOTE — Patient Instructions (Signed)
Please consider the recommendations that we have given you today:  Obtain further tests a 2 stage or cancer (CEA blood level, a CT scan of chest/abdomen/pelvis, endoscopic ultrasound)  Most likely will benefit from medical, radiation, and other oncological consultations.  At some point, you most likely will benefit from surgical resection of a rectal cancer.  We worked at times depending on whether or not he would benefit from chemotherapy and radiation therapy before surgery.    See the Handout(s) we have given you.  Please call our office at [phone] if you wish to schedule surgery or if you have further questions / concerns.   Colorectal Cancer Colorectal cancer is an abnormal growth of tissue (tumor) in the colon or rectum that is cancerous (malignant). Unlike noncancerous (benign) tumors, malignant tumors can spread to other parts of your body. The colon is the large bowel or large intestine. The rectum is the last several inches of the colon.  RISK FACTORS The exact cause of colorectal cancer is unknown. However, the following factors may increase your chances of getting colorectal cancer:   Age older than 66 years.   Abnormal growths (polyps) on the inner wall of the colon or rectum.   Diabetes.   African American race.   Family history of hereditary nonpolyposis colorectal cancer. This condition is caused by changes in the genes that are responsible for repairing mismatched DNA.   Personal history of cancer. A person who has already had colorectal cancer may develop it a second time. Also, women with a history of ovarian, uterine, or breast cancer are at a somewhat higher risk of developing colorectal cancer.  Certain hereditary conditions.  Eating a diet that is high in fat (especially animal fat) and low in fiber, fruits, and vegetables.  Sedentary lifestyle.  Inflammatory bowel disease, including ulcerative colitis and Crohn disease.   Smoking.    Excessive alcohol use.  SYMPTOMS Early colorectal cancer often does not cause symptoms. As the cancer grows, symptoms may include:   Changes in bowel habits.  Diarrhea.   Constipation.   Feeling like the bowel does not empty completely after a bowel movement.   Blood in the stool.   Stools that are narrower than usual.   Abdominal discomfort, pain, bloating, fullness, or cramps.  Frequent gas pain.   Unexplained weight loss.   Constant tiredness.   Nausea and vomiting.  DIAGNOSIS  Your health care provider will ask about your medical history. He or she may also perform a number of procedures, such as:   A physical exam.  A digital rectal exam.  A fecal occult blood test.  A barium enema.  Blood tests.   X-rays.   Imaging tests, such as CT scans or MRIs.   Taking a tissue sample (biopsy) from your colon or rectum to look for cancer cells.   A sigmoidoscopy to view the inside of the last part of your colon.   A colonoscopy to view the inside of your entire colon.   An endorectal ultrasound to see how deep a rectal tumor has grown and whether the cancer has spread to lymph nodes or other nearby tissues.  Your cancer will be staged to determine its severity and extent. Staging is a careful attempt to find out the size of the tumor, whether the cancer has spread, and if so, to what parts of the body. You may need to have more tests to determine the stage of your cancer. The test results will help  determine what treatment plan is best for you.   Stage 0 The cancer is found only in the innermost lining of the colon or rectum.   Stage I The cancer has grown into the inner wall of the colon or rectum. The cancer has not yet reached the outer wall of the colon.   Stage II The cancer extends more deeply into or through the wall of the colon or rectum. It may have invaded nearby tissue, but cancer cells have not spread to the lymph nodes.    Stage III The cancer has spread to nearby lymph nodes but not to other parts of the body.   Stage IV The cancer has spread to other parts of the body, such as the liver or lungs.  Your health care provider may tell you the detailed stage of your cancer, which includes both a number and a letter.  TREATMENT  Depending on the type and stage, colorectal cancer may be treated with surgery, radiation therapy, chemotherapy, targeted therapy, or radiofrequency ablation. Some people have a combination of these therapies. Surgery may be done to remove the polyps from your colon. In early stages, your health care provider may be able to do this during a colonoscopy. In later stages, surgery may be done to remove part of your colon.  HOME CARE INSTRUCTIONS   Only take over-the-counter or prescription medicines for pain, discomfort, or fever as directed by your health care provider.   Maintain a healthy diet.   Consider joining a support group. This may help you learn to cope with the stress of having colorectal cancer.   Seek advice to help you manage treatment of side effects.   Keep all follow-up appointments as directed by your health care provider.   Inform your cancer specialist if you are admitted to the hospital.  SEEK MEDICAL CARE IF:  Your diarrhea or constipation does not go away.   Your bowel habits change.  You have increased abdominal pain.   You notice new fatigue or weakness.  You lose weight. Document Released: 04/03/2005 Document Revised: 12/04/2012 Document Reviewed: 09/26/2012 [facility] Patient Information 2014 [place], [place]!  We strongly recommend that you stop smoking.  Smoking increases the risk of surgery including infection in the form of an open wound, pus formation, abscess, hernia at an incision on the abdomen, etc.  You have an increased risk of other MAJOR complications such as stroke, heart attack, forming clots in the leg and/or  lungs, and death.    Smoking Cessation Quitting smoking is important to your health and has many advantages. However, it is not always easy to quit since nicotine is a very addictive drug. Often times, people try 3 times or more before being able to quit. This document explains the best ways for you to prepare to quit smoking. Quitting takes hard work and a lot of effort, but you can do it. ADVANTAGES OF QUITTING SMOKING  You will live longer, feel better, and live better.  Your body will feel the impact of quitting smoking almost immediately.  Within 20 minutes, blood pressure decreases. Your pulse returns to its normal level.  After 8 hours, carbon monoxide levels in the blood return to normal. Your oxygen level increases.  After 24 hours, the chance of having a heart attack starts to decrease. Your breath, hair, and body stop smelling like smoke.  After 48 hours, damaged nerve endings begin to recover. Your sense of taste and smell improve.  After  72 hours, the body is virtually free of nicotine. Your bronchial tubes relax and breathing becomes easier.  After 2 to 12 weeks, lungs can hold more air. Exercise becomes easier and circulation improves.  The risk of having a heart attack, stroke, cancer, or lung disease is greatly reduced.  After 1 year, the risk of coronary heart disease is cut in half.  After 5 years, the risk of stroke falls to the same as a nonsmoker.  After 10 years, the risk of lung cancer is cut in half and the risk of other cancers decreases significantly.  After 15 years, the risk of coronary heart disease drops, usually to the level of a nonsmoker.  If you are pregnant, quitting smoking will improve your chances of having a healthy baby.  The people you live with, especially any children, will be healthier.  You will have extra money to spend on things other than cigarettes. QUESTIONS TO THINK ABOUT BEFORE ATTEMPTING TO QUIT You may want to talk about  your answers with your caregiver.  Why do you want to quit?  If you tried to quit in the past, what helped and what did not?  What will be the most difficult situations for you after you quit? How will you plan to handle them?  Who can help you through the tough times? Your family? Friends? A caregiver?  What pleasures do you get from smoking? What ways can you still get pleasure if you quit? Here are some questions to ask your caregiver:  How can you help me to be successful at quitting?  What medicine do you think would be best for me and how should I take it?  What should I do if I need more help?  What is smoking withdrawal like? How can I get information on withdrawal? GET READY  Set a quit date.  Change your environment by getting rid of all cigarettes, ashtrays, matches, and lighters in your home, car, or work. Do not let people smoke in your home.  Review your past attempts to quit. Think about what worked and what did not. GET SUPPORT AND ENCOURAGEMENT You have a better chance of being successful if you have help. You can get support in many ways.  Tell your family, friends, and co-workers that you are going to quit and need their support. Ask them not to smoke around you.  Get individual, group, or telephone counseling and support. Programs are available at [employer] and health centers. Call your local health department for information about programs in your area.  Spiritual beliefs and practices may help some smokers quit.  Download a "quit meter" on your computer to keep track of quit statistics, such as how long you have gone without smoking, cigarettes not smoked, and money saved.  Get a self-help book about quitting smoking and staying off of tobacco. Breese yourself from urges to smoke. Talk to someone, go for a walk, or occupy your time with a task.  Change your normal routine. Take a different route to work. Drink  tea instead of coffee. Eat breakfast in a different place.  Reduce your stress. Take a hot bath, exercise, or read a book.  Plan something enjoyable to do every day. Reward yourself for not smoking.  Explore interactive web-based programs that specialize in helping you quit. GET MEDICINE AND USE IT CORRECTLY Medicines can help you stop smoking and decrease the urge to smoke. Combining medicine with the above  behavioral methods and support can greatly increase your chances of successfully quitting smoking.  Nicotine replacement therapy helps deliver nicotine to your body without the negative effects and risks of smoking. Nicotine replacement therapy includes nicotine gum, lozenges, inhalers, nasal sprays, and skin patches. Some may be available over-the-counter and others require a prescription.  Antidepressant medicine helps people abstain from smoking, but how this works is unknown. This medicine is available by prescription.  Nicotinic receptor partial agonist medicine simulates the effect of nicotine in your brain. This medicine is available by prescription. Ask your caregiver for advice about which medicines to use and how to use them based on your health history. Your caregiver will tell you what side effects to look out for if you choose to be on a medicine or therapy. Carefully read the information on the package. Do not use any other product containing nicotine while using a nicotine replacement product.  RELAPSE OR DIFFICULT SITUATIONS Most relapses occur within the first 3 months after quitting. Do not be discouraged if you start smoking again. Remember, most people try several times before finally quitting. You may have symptoms of withdrawal because your body is used to nicotine. You may crave cigarettes, be irritable, feel very hungry, cough often, get headaches, or have difficulty concentrating. The withdrawal symptoms are only temporary. They are strongest when you first quit, but  they will go away within 10 14 days. To reduce the chances of relapse, try to:  Avoid drinking alcohol. Drinking lowers your chances of successfully quitting.  Reduce the amount of caffeine you consume. Once you quit smoking, the amount of caffeine in your body increases and can give you symptoms, such as a rapid heartbeat, sweating, and anxiety.  Avoid smokers because they can make you want to smoke.  Do not let weight gain distract you. Many smokers will gain weight when they quit, usually less than 10 pounds. Eat a healthy diet and stay active. You can always lose the weight gained after you quit.  Find ways to improve your mood other than smoking. FOR MORE INFORMATION  www.smokefree.gov    While it can be one of the most difficult things to do, the [place] community has programs to help you stop.  Consider talking with your primary care physician about options.  Also, Smoking Cessation classes are available through the [facility] Health:  The smoking cessation program is a proven-effective program from the American Lung Association. The program is available for anyone 66 and older who currently smokes. The program lasts for 7 weeks and is 8 sessions. Each class will be approximately 1 1/2 hours. The program is every Tuesday.  All classes are 12-1:30pm and same location.  Event Location Information:  Location: [place] 2nd Floor Conference Room 2-037; located next to [facility] cross streets: [place] Entrance into the [facility] is adjacent to the [employer] main entrance. The conference room is located on the 2nd floor.  Parking Instructions: Visitor parking is adjacent to [employer] main entrance and the [place]    A smoking cessation program is also offered through the [facility]. Register online at ClickDebate.gl or call [phone] for more information.    Tobacco cessation counseling is available at [facility]. Call [phone] for a free appointment.   Tobacco cessation classes also are available through the [place] in [place]. For information, call [phone].  The Patient Education Network features videos on tobacco cessation. Please consult your listings in the center of this book to find instructions on how to access this resource.   If you want more information, ask your nurse.

## 2013-06-16 NOTE — Telephone Encounter (Signed)
Added pt to GI cancer conference full discussion for 06/25/13 b/c the pt has to get scheduled for the EUS and CT chest,abd,pelvis is scheduled for 06/20/13.

## 2013-06-17 NOTE — Telephone Encounter (Signed)
Called to give pt's wife all the appt's that I have the pt scheduled for in the next couple of weeks. I made an appt with Dr Paulita Fujita for 06/18/13 @ 1:00 to discuss getting scheduled for the EUS. I have the pt scheduled for CT chest,abd,&pelvis w/contrast @ [facility] for 06/20/13 @8 :00. I advised the pt to drink his 1st bottle of contrast on 3/6 at 6:00am and the 2nd bottle at 7:00am. I advised the pt he can not have anything to eat or drink 4hrs before the xray. I have the pt scheduled for an appt with DR Lisbeth Renshaw on 07/02/13 arrive at 10:00 along with the appt with Dr Benay Spice on 07/03/13 arrive at 1:00. I did ask for the pt to go get his labs drawn from [facility] on 3/4 after he leaves his appt with Dr Paulita Fujita since he will be on the side of town. The pt understands.

## 2013-06-18 ENCOUNTER — Telehealth: Payer: Self-pay | Admitting: *Deleted

## 2013-06-18 NOTE — Telephone Encounter (Signed)
Attempted to reach patient by phone without success.  Will try again tomorrow.

## 2013-06-19 ENCOUNTER — Other Ambulatory Visit: Payer: Self-pay | Admitting: Gastroenterology

## 2013-06-19 ENCOUNTER — Encounter (INDEPENDENT_AMBULATORY_CARE_PROVIDER_SITE_OTHER): Payer: Self-pay

## 2013-06-19 LAB — CEA: CEA: 1.2 ng/mL (ref 0.0–5.0)

## 2013-06-19 NOTE — Telephone Encounter (Signed)
Called pt's wife to get them to go get labs drawn today for the CT that is scheduled in the a.m.. I have ordered the labs STAT so the results will be her in time for the test. The wife will take the pt today.

## 2013-06-19 NOTE — Addendum Note (Signed)
Addended by: Bertie Mcconathy on: 06/19/2013 05:07 PM   Modules accepted: Orders  

## 2013-06-20 ENCOUNTER — Ambulatory Visit
Admission: RE | Admit: 2013-06-20 | Discharge: 2013-06-20 | Disposition: A | Discharge status: Home / Self Care | Payer: Medicare Other | Source: Ambulatory Visit | Attending: Surgery | Admitting: Surgery

## 2013-06-20 DIAGNOSIS — C20 Malignant neoplasm of rectum: Secondary | ICD-10-CM

## 2013-06-20 LAB — COMPREHENSIVE METABOLIC PANEL
ALBUMIN: 3.6 g/dL (ref 3.5–5.2)
ALK PHOS: 73 U/L (ref 39–117)
ALT: 8 U/L (ref 0–53)
AST: 13 U/L (ref 0–37)
BUN: 10 mg/dL (ref 6–23)
CALCIUM: 8.6 mg/dL (ref 8.4–10.5)
CHLORIDE: 104 meq/L (ref 96–112)
CO2: 30 mEq/L (ref 19–32)
Creat: 0.7 mg/dL (ref 0.50–1.35)
Glucose, Bld: 102 mg/dL — ABNORMAL HIGH (ref 70–99)
POTASSIUM: 4.3 meq/L (ref 3.5–5.3)
SODIUM: 141 meq/L (ref 135–145)
Total Bilirubin: 0.4 mg/dL (ref 0.2–1.2)
Total Protein: 6 g/dL (ref 6.0–8.3)

## 2013-06-20 MED ORDER — IOHEXOL 300 MG/ML  SOLN
100.0000 mL | Freq: Once | INTRAMUSCULAR | Status: AC | PRN
  Administered 2013-06-20: 100 mL via INTRAVENOUS

## 2013-06-23 ENCOUNTER — Telehealth: Payer: Self-pay | Admitting: *Deleted

## 2013-06-23 NOTE — Telephone Encounter (Signed)
Called to move the discussion with GI cancer conference back to 07/02/13 due to Dr Johney Maine not being in town on 06/25/13.

## 2013-06-23 NOTE — Telephone Encounter (Signed)
Spoke with patient's wife and confirmed appointments with Dr. Lisbeth Renshaw and Dr. Benay Spice.  Contact names, numbers, and directions were provided.  Questions were answered.

## 2013-06-23 NOTE — Telephone Encounter (Signed)
Called pt's wife to notify them that the CT scans are showing no evidence of metastatic disease in the lungs or liver just the known rectal cancer. The pt's wife was asking if the lymph nodes were involved and I explained to her that we would not know about the lymph nodes until surgery. The pt is scheduled for the EUS on 3/11 along with appt's to Dr Lisbeth Renshaw and Dr Benay Spice next week. The pt's wife understands.

## 2013-06-25 ENCOUNTER — Encounter (HOSPITAL_COMMUNITY): Payer: Self-pay

## 2013-06-25 ENCOUNTER — Encounter (HOSPITAL_COMMUNITY)
Admission: RE | Disposition: A | Discharge status: Home / Self Care | Payer: Self-pay | Source: Ambulatory Visit | Attending: Gastroenterology

## 2013-06-25 ENCOUNTER — Ambulatory Visit (HOSPITAL_COMMUNITY)
Admission: RE | Admit: 2013-06-25 | Discharge: 2013-06-25 | Disposition: A | Discharge status: Home / Self Care | Payer: Medicare Other | Source: Ambulatory Visit | Attending: Gastroenterology | Admitting: Gastroenterology

## 2013-06-25 DIAGNOSIS — Z800 Family history of malignant neoplasm of digestive organs: Secondary | ICD-10-CM | POA: Insufficient documentation

## 2013-06-25 DIAGNOSIS — S68118A Complete traumatic metacarpophalangeal amputation of other finger, initial encounter: Secondary | ICD-10-CM | POA: Insufficient documentation

## 2013-06-25 DIAGNOSIS — Z801 Family history of malignant neoplasm of trachea, bronchus and lung: Secondary | ICD-10-CM | POA: Insufficient documentation

## 2013-06-25 DIAGNOSIS — C20 Malignant neoplasm of rectum: Secondary | ICD-10-CM | POA: Insufficient documentation

## 2013-06-25 DIAGNOSIS — K644 Residual hemorrhoidal skin tags: Secondary | ICD-10-CM | POA: Insufficient documentation

## 2013-06-25 DIAGNOSIS — M4120 Other idiopathic scoliosis, site unspecified: Secondary | ICD-10-CM | POA: Insufficient documentation

## 2013-06-25 DIAGNOSIS — K623 Rectal prolapse: Secondary | ICD-10-CM | POA: Insufficient documentation

## 2013-06-25 DIAGNOSIS — F17200 Nicotine dependence, unspecified, uncomplicated: Secondary | ICD-10-CM | POA: Insufficient documentation

## 2013-06-25 HISTORY — PX: EUS: SHX5427

## 2013-06-25 SURGERY — ULTRASOUND, LOWER GI TRACT, ENDOSCOPIC
Anesthesia: Moderate Sedation

## 2013-06-25 MED ORDER — MIDAZOLAM HCL 10 MG/2ML IJ SOLN
INTRAMUSCULAR | Status: DC | PRN
  Administered 2013-06-25 (×2): 2 mg via INTRAVENOUS

## 2013-06-25 MED ORDER — SODIUM CHLORIDE 0.9 % IV SOLN: INTRAVENOUS | Status: DC

## 2013-06-25 MED ORDER — FENTANYL CITRATE 0.05 MG/ML IJ SOLN
INTRAMUSCULAR | Status: DC | PRN
  Administered 2013-06-25 (×2): 25 ug via INTRAVENOUS

## 2013-06-25 MED ORDER — FENTANYL CITRATE 0.05 MG/ML IJ SOLN
INTRAMUSCULAR | Status: AC
  Filled 2013-06-25: qty 2

## 2013-06-25 MED ORDER — MIDAZOLAM HCL 10 MG/2ML IJ SOLN
INTRAMUSCULAR | Status: AC
  Filled 2013-06-25: qty 2

## 2013-06-25 NOTE — Discharge Instructions (Signed)
Endorectal ultrasound (rectal ultrasound, RUS)  Post procedure instructions:  Read the instructions outlined below and refer to this sheet in the next few weeks. These discharge instructions provide you with general information on caring for yourself after you leave the hospital. Your doctor may also give you specific instructions. While your treatment has been planned according to the most current medical practices available, unavoidable complications occasionally occur. If you have any problems or questions after discharge, call Dr. Paulita Fujita at [facility] [phone]).  HOME CARE INSTRUCTIONS  ACTIVITY:  You may resume your regular activity, but move at a slower pace for the next 24 hours.   Take frequent rest periods for the next 24 hours.   Walking will help get rid of the air and reduce the bloated feeling in your belly (abdomen).   No driving for 24 hours (because of the medicine (anesthesia) used during the test).   You may shower.   Do not sign any important legal documents or operate any machinery for 24 hours (because of the anesthesia used during the test).  NUTRITION:  Drink plenty of fluids.   You may resume your normal diet as instructed by your doctor.   Begin with a light meal and progress to your normal diet. Heavy or fried foods are harder to digest and may make you feel sick to your stomach (nauseated).   Avoid alcoholic beverages for 24 hours or as instructed.  MEDICATIONS:  You may resume your normal medications unless your doctor tells you otherwise.  WHAT TO EXPECT TODAY:  Some feelings of bloating in the abdomen.   Passage of more gas than usual.   Spotting of blood in your stool or on the toilet paper.  IF YOU HAD POLYPS REMOVED DURING THE COLONOSCOPY:  No aspirin products for 7 days or as instructed.   No alcohol for 7 days or as instructed.   Eat a soft diet for the next 24 hours.   FINDING OUT THE RESULTS OF YOUR TEST  Not all test  results are available during your visit. If your test results are not back during the visit, make an appointment with your caregiver to find out the results. Do not assume everything is normal if you have not heard from your caregiver or the medical facility. It is important for you to follow up on all of your test results.     SEEK IMMEDIATE MEDICAL CARE IF:   You have more than a spotting of blood in your stool.   Your belly is swollen (abdominal distention).   You are nauseated or vomiting.   You have a fever.   You have abdominal pain or discomfort that is severe or gets worse throughout the day.    Document Released: 11/16/2003 Document Revised: 12/14/2010 Document Reviewed: 11/14/2007 [facility] Patient Information 2012 [place].

## 2013-06-25 NOTE — Interval H&P Note (Signed)
History and Physical Interval Note:  06/25/2013 9:25 AM  Richard [name]  has presented today for surgery, with the diagnosis of rectal cancer  The various methods of treatment have been discussed with the patient and family. After consideration of risks, benefits and other options for treatment, the patient has consented to  Procedure(s): LOWER ENDOSCOPIC ULTRASOUND (EUS) (N/A) as a surgical intervention .  The patient's history has been reviewed, patient examined, no change in status, stable for surgery.  I have reviewed the patient's chart and labs.  Questions were answered to the patient's satisfaction.     Davinder Haff M  Assessment:  1.  Rectal cancer.  Plan:  1.  Endorectal ultrasound for locoregional staging. 2.  Risks (bleeding, infection, bowel perforation that could require surgery, sedation-related changes in cardiopulmonary systems), benefits (identification and possible treatment of source of symptoms, exclusion of certain causes of symptoms), and alternatives (watchful waiting, radiographic imaging studies, empiric medical treatment) of endorectal ultrasound (rectal ultrasound, RUS) were explained to patient/family in detail and patient wishes to proceed.

## 2013-06-25 NOTE — Op Note (Signed)
[facility] [place], [address]   ENDOSCOPIC ULTRASOUND PROCEDURE REPORT  PATIENT: Richard [name], Richard [name]  MR#: [id number] BIRTHDATE: [birth year]  GENDER: Male ENDOSCOPIST: Arta Silence, MD REFERRED BY:  Michael Boston, M.D. , Anson Fret, MD PROCEDURE DATE:  06/25/2013 PROCEDURE:   Lower EUS ASA CLASS:      Class II INDICATIONS:   1.  rectal adenocarcinoma. MEDICATIONS: Fentanyl 50 mcg IV and Versed 4 mg IV  DESCRIPTION OF PROCEDURE:   After the risks benefits and alternatives of the procedure were  explained, informed consent was obtained. The patient was then placed in the left, lateral, decubitus postion and IV sedation was administered. Throughout the procedure, the patients blood pressure, pulse and oxygen saturations were monitored continuously.  Under direct visualization, the radial forward-viewing echoendoscope was introduced through the mouth  and advanced to the sigmoid colon . Water was used as necessary to provide an acoustic interface.  Upon completion of the imaging, water was removed and the patient was sent to the recovery room in satisfactory condition.    FINDINGS:      Large external hemorrhoids and some rectal prolapse seen on external anal exam.  Upon digital rectal exam, a firm fixed mass readily palpable at end of examination digit.   Subsequently, the radial echoendoscope was inserted.  A multilobular, friable, completely circumferential, ulcerated mass was seen from 8-15 cm from the anal verge.  Ultrasonographically, the tumor penetrates through the muscularis propria and into neighboring connective tissues in most views.  Several small round, hypoechoic, well-defined, malignant-appearing peritumoral lymph nodes were identified.  IMPRESSION:     As above.  Locoregional staging of patient's proximal rectal cancer is T4 N2 Mx by endorectal ultrasound.  RECOMMENDATIONS:     1.  Watch for potential complications of  the procedure. 2.  Patient will need upfront neoadjuvant chemoradiative therapy prior to consideration of surgical resection. 3.  Follow-up with [facility] on as-needed. 4.  Patient already has appointments with radiation and medical oncology specialists.   _______________________________ Lorrin MaisArta Silence, MD 06/25/2013 9:51 AM   CC:

## 2013-06-25 NOTE — H&P (View-Only) (Signed)
Subjective:     Patient ID: Richard [name], male   DOB: [birth year], 79 y.o.   MRN: [id number]  HPI  Note: This dictation was prepared with Dragon/digital dictation along with [facility] technology. Any transcriptional errors that result from this process are unintentional.       Richard [name]  [birth year] [id number]  Patient Care Team: Leonard Downing, MD as PCP - General (Family Medicine) Adin Hector, MD as Consulting Physician (General Surgery) Wonda Horner, MD as Consulting Physician (Gastroenterology) Linna Hoff, MD as Consulting Physician (Orthopedic Surgery) Hayden Pedro, MD as Consulting Physician (Ophthalmology)  This patient is a 79 y.o.male who presents today for surgical evaluation at the request of Dr. Penelope Coop.   Reason for visit: Rectal cancer  Pleasant gentleman.  Comes today with his wife.  Both heavy smokers.  Normally has a bowel movement every day for his life.  However, it began to have some fecal urgency and bleeding. Started around Thanksgiving.  History of chronic hemorrhoids.  Thought it was that.  Worsened.  Urgency with many loose bowel movements.  Based on concerns, underwent colonoscopy.  Have small polyps proximally.  Large mid rectal mass 10 cm long.  Biopsy consistent with adenocarcinoma.  Surgical consultation requested.  No personal nor family history of GI/colon cancer, inflammatory bowel disease, irritable bowel syndrome, allergy such as Celiac Sprue, dietary/dairy problems, colitis, ulcers nor gastritis.  No recent sick contacts/gastroenteritis.  No travel outside the country.  No changes in diet.  No dysphagia to solids or liquids.  No significant heartburn or reflux.  No hematochezia, hematemesis, coffee ground emesis.  No evidence of prior gastric/peptic ulceration.   No exertional chest/neck/shoulder/arm pain.  Patient can walk 60 minutes for about 2 miles without difficulty.   No history of heart attack or stroke.  He is hesitant to  consider smoking cessation     Patient Active Problem List   Diagnosis Date Noted  . Rectal adenocarcinoma (7cm posterior) 06/16/2013  . Tobacco abuse 06/16/2013  . Kyphoscoliosis deformity of spine     Past Medical History  Diagnosis Date  . Rectal adenocarcinoma (7cm posterior) 06/16/2013  . Kyphoscoliosis deformity of spine     Past Surgical History  Procedure Laterality Date  . I&d extremity  06/07/2011    Procedure: IRRIGATION AND DEBRIDEMENT EXTREMITY;  Surgeon: Linna Hoff, MD;  Location: [facility];  Service: Orthopedics;  Laterality: Left;  . Amputation  06/07/2011    Procedure: AMPUTATION DIGIT;  Surgeon: Linna Hoff, MD;  Location: [place];  Service: Orthopedics;  Laterality: Left;  Revision multiple fingers index and middle     History   Social History  . Marital Status: Married    Spouse Name: N/A    Number of Children: N/A  . Years of Education: N/A   Occupational History  . Not on file.   Social History Main Topics  . Smoking status: Current Every Day Smoker -- 0.50 packs/day  . Smokeless tobacco: Not on file  . Alcohol Use: 3.0 oz/week    5 Cans of beer per week  . Drug Use: No  . Sexual Activity:    Other Topics Concern  . Not on file   Social History Narrative  . No narrative on file    No family history on file.  No current outpatient prescriptions on file.   No current facility-administered medications for this visit.     No Known Allergies  BP 126/82  Pulse 70  Resp  16  Ht 5' 11.5" (1.816 m)  Wt 156 lb (70.761 kg)  BMI 21.46 kg/m2  No results found.   Review of Systems  Constitutional: Positive for unexpected weight change. Negative for fever, chills and diaphoresis.  HENT: Negative for ear discharge, facial swelling, mouth sores, nosebleeds, sore throat and trouble swallowing.   Eyes: Negative for photophobia, discharge and visual disturbance.  Respiratory: Negative for choking, chest tightness, shortness of breath and  stridor.   Cardiovascular: Negative for chest pain and palpitations.  Gastrointestinal: Positive for diarrhea, blood in stool and anal bleeding. Negative for nausea, vomiting, abdominal pain, constipation and abdominal distention.  Endocrine: Negative for cold intolerance and heat intolerance.  Genitourinary: Negative for dysuria, urgency, difficulty urinating and testicular pain.  Musculoskeletal: Negative for arthralgias, back pain, gait problem and myalgias.  Skin: Negative for color change, pallor, rash and wound.  Allergic/Immunologic: Negative for environmental allergies and food allergies.  Neurological: Negative for dizziness, speech difficulty, weakness, numbness and headaches.  Hematological: Negative for adenopathy. Does not bruise/bleed easily.  Psychiatric/Behavioral: Negative for hallucinations, confusion and agitation.       Objective:   Physical Exam  Constitutional: He is oriented to person, place, and time. He appears well-developed. He appears cachectic. He is cooperative.  Non-toxic appearance. He does not have a sickly appearance. No distress.  HENT:  Head: Normocephalic.  Mouth/Throat: Oropharynx is clear and moist. No oropharyngeal exudate.  Eyes: Conjunctivae and EOM are normal. Pupils are equal, round, and reactive to light. No scleral icterus.  Neck: Trachea normal. Rigidity present. No tracheal deviation present.    Cardiovascular: Normal rate, regular rhythm and intact distal pulses.   Pulmonary/Chest: Effort normal and breath sounds normal. No stridor. No respiratory distress.  Abdominal: Soft. He exhibits no distension. There is no tenderness. Hernia confirmed negative in the right inguinal area and confirmed negative in the left inguinal area.    Genitourinary: Rectal exam shows mass and tenderness. Guaiac positive stool.  Exam done with assistance of male Medical Assistant in the room.  Perianal skin clean with good hygiene.  No pruritis.  No pilonidal  disease.  No fissure.  No abscess/fistula.  External hemorrhoids moderate.  Normal sphincter tone.  Tolerates digital rectal exam. Hemorrhoidal piles mild/mod enlarged  Obvious fixed fungating rectal mass, most distal margin ~7cm proximal to anal sphincter, posterior midline 1/3 circumference.  Sensitive.   Musculoskeletal: Normal range of motion. He exhibits no tenderness.       Hands: Lymphadenopathy:       Head (right side): No submental, no submandibular, no posterior auricular and no occipital adenopathy present.       Head (left side): No submental, no submandibular, no posterior auricular and no occipital adenopathy present.    He has no cervical adenopathy.    He has no axillary adenopathy.       Right: No inguinal and no supraclavicular adenopathy present.       Left: No inguinal and no supraclavicular adenopathy present.  Neurological: He is alert and oriented to person, place, and time. No cranial nerve deficit. He exhibits normal muscle tone. Coordination normal.  Skin: Skin is warm and dry. No rash noted. No erythema. No pallor.  Psychiatric: He has a normal mood and affect. His behavior is normal. Judgment and thought content normal.       Assessment:     Bulky mid to proximal rectal cancer and elderly smoking male     Plan:     I spent over  an hour reviewing his chart, studies, history, and physical.  Staging information.  Obtain CEA, CT chest/abdomen/pelvis.  Endoscopic ultrasound to stage cancer.  Strongly suspicious for at least T3.  Probable neoadjuvant chemoradiation therapy.  Setup medical/radiation oncology appointments.  Discuss at GI tumor board as well  If no evidence of frank metastatic disease, he would benefit from surgical segmental resection.  Regional perform minimally invasive (robotic/laparoscopic).  Suspect he will need diverting loop ileostomy to protect the anastomosis as it will be within 5 cm of the anal verge to get good margins:  The anatomy  & physiology of the digestive tract was discussed.  The pathophysiology was discussed.  Natural history risks without surgery was discussed.   I worked to give an overview of the disease and the frequent need to have multispecialty involvement.  I feel the risks of no intervention will lead to serious problems that outweigh the operative risks; therefore, I recommended a partial proctocolectomy to remove the pathology.  Minimally Invasive (Robotic/Laparoscopic) & open techniques were discussed.  We will work to preserve anal & pelvic floor function without sacrificing cure.  Risks such as bleeding, infection, abscess, leak, reoperation, possible ostomy, hernia, heart attack, death, and other risks were discussed.  I noted a good likelihood this will help address the problem.   Goals of post-operative recovery were discussed as well.  We will work to minimize complications.  An educational handout on the pathology was given as well.  Questions were answered.    The patient & his wife express understanding & wishes to proceed with workup & surgery.   I strongly encouraged him to quit smoking.  He told me he admits "all my life".  He is hesitant to think he will be successful in doing that.  We talked to the patient about the dangers of smoking.  We stressed that tobacco use dramatically increases the risk of peri-operative complications such as infection, tissue necrosis leaving to problems with incision/wound and organ healing, hernia, chronic pain, heart attack, stroke, DVT, pulmonary embolism, and death.  We noted there are programs in our community to help stop smoking.  Information was available.

## 2013-06-26 ENCOUNTER — Encounter (HOSPITAL_COMMUNITY): Payer: Self-pay | Admitting: Gastroenterology

## 2013-06-27 ENCOUNTER — Encounter: Payer: Self-pay | Admitting: Radiation Oncology

## 2013-06-27 NOTE — Progress Notes (Addendum)
GU Location of Tumor / Histology: Rectal  Patient presented months ago with signs/symptoms of: bleeding and fecal urgency since Thanksgiving, hx chronic hemorrhoids   B  Biopsies of 05/29/13 Revealed:Colonoscopy with polyps proximally ,large rectal mass 10cm long   Dr. 06/25/13 EUS Dr.William Outlaw  Past/Anticipated interventions by urology, if any: no  Past/Anticipated interventions by medical oncology, if any: appt with Dr.Sherrill,07/03/13  Weight changes, if any: yes Bowel/Bladder complaints, if any: chronic hemorrhoids, blood on tissue when wiping after bowel movements still occasionally  Nausea/Vomiting, if any: no Pain issues, if any:  no  SAFETY ISSUES:  Prior radiation? No  Pacemaker/ICD? No  Is the patient on methotrexate? No  Current Complaints / other details:  No hx of Colon cancer, heavy smoker 1 ppd fopr 8 years ,Married, alcohol use 3 oz./week, 5 cans beer/weekly, no illicit drug use, mother stomach cancer, father lung cancer

## 2013-07-02 ENCOUNTER — Encounter: Payer: Self-pay | Admitting: Radiation Oncology

## 2013-07-02 ENCOUNTER — Ambulatory Visit
Admission: RE | Admit: 2013-07-02 | Discharge: 2013-07-02 | Disposition: A | Discharge status: Home / Self Care | Payer: Medicare Other | Source: Ambulatory Visit | Attending: Radiation Oncology | Admitting: Radiation Oncology

## 2013-07-02 DIAGNOSIS — Z860100 Personal history of colon polyps, unspecified: Secondary | ICD-10-CM | POA: Insufficient documentation

## 2013-07-02 DIAGNOSIS — M4120 Other idiopathic scoliosis, site unspecified: Secondary | ICD-10-CM | POA: Insufficient documentation

## 2013-07-02 DIAGNOSIS — C20 Malignant neoplasm of rectum: Secondary | ICD-10-CM | POA: Insufficient documentation

## 2013-07-02 DIAGNOSIS — S68118A Complete traumatic metacarpophalangeal amputation of other finger, initial encounter: Secondary | ICD-10-CM | POA: Insufficient documentation

## 2013-07-02 DIAGNOSIS — Z79899 Other long term (current) drug therapy: Secondary | ICD-10-CM | POA: Insufficient documentation

## 2013-07-03 ENCOUNTER — Ambulatory Visit: Payer: Medicare Other

## 2013-07-03 ENCOUNTER — Other Ambulatory Visit: Payer: Self-pay | Admitting: *Deleted

## 2013-07-03 ENCOUNTER — Encounter: Payer: Self-pay | Admitting: Oncology

## 2013-07-03 ENCOUNTER — Ambulatory Visit (HOSPITAL_BASED_OUTPATIENT_CLINIC_OR_DEPARTMENT_OTHER): Payer: Medicare Other | Admitting: Oncology

## 2013-07-03 ENCOUNTER — Telehealth: Payer: Self-pay | Admitting: Oncology

## 2013-07-03 VITALS — BP 127/59 | HR 70 | Temp 98.2°F | Resp 18 | Ht 71.5 in | Wt 155.5 lb

## 2013-07-03 DIAGNOSIS — F17200 Nicotine dependence, unspecified, uncomplicated: Secondary | ICD-10-CM

## 2013-07-03 DIAGNOSIS — C20 Malignant neoplasm of rectum: Secondary | ICD-10-CM

## 2013-07-03 DIAGNOSIS — R197 Diarrhea, unspecified: Secondary | ICD-10-CM

## 2013-07-03 MED ORDER — CAPECITABINE 500 MG PO TABS: ORAL_TABLET | ORAL | Status: DC

## 2013-07-03 NOTE — Progress Notes (Signed)
Faxed xeloda prescription to [facility] °

## 2013-07-03 NOTE — Telephone Encounter (Signed)
gv pt appt schedule for march/april °

## 2013-07-03 NOTE — Progress Notes (Signed)
Met with Richard [name] and family. Explained role of nurse navigator. Educational information provided on colorectal cancer  Referral made to dietician for diet education. [place] resources provided to patient, including SW service and support group information.  Smoking cessation information was provided, but patient declined referral and stated he would not stop smoking.  Contact names and phone numbers were provided for entire [facility] team.  Patient to contact this RN if he has not heard back from insurance/drug company for supply of Xeloda.  Teach back method was used.  No barriers to care identified.  Will continue to follow as needed.

## 2013-07-03 NOTE — Progress Notes (Signed)
[facility] Patient Consult   Referring MD: Dionisio Aragones 79 y.o.  1934-08-01    Reason for Referral: Rectal cancer     HPI: He reports "diarrhea "beginning before Thanksgiving of 2014. He recently developed rectal bleeding and increased rectal pain. He has a chronic history of "hemorrhoids "with associated pain.  He was referred to Dr. Penelope Coop and was taken to a colonoscopy on 05/29/2013. Nonthrombosed hemorrhoids were noted on anal exam. A firm rectal mass was palpated. 4 sessile polyps were found in the a sending colon. The polyps were removed. The polyp tissue was partially retrieved. A polyp was found in the proximal descending colon. The polyp was removed. A partially obstructing mass was found in the rectosigmoid colon, the mass measured 10 cm in length and started at 5 cm and extended proximally to 15 cm. Biopsies were taken and the area was tattooed. The pathology [id number]) confirmed multiple fragments of tubular adenomas at the a sending colon. The descending colon polyp returned as a 2 blue villous adenoma. The rectal mass appeared as a well differentiated invasive adenocarcinoma.  He was referred to Dr. Alcohol for an endoscopic ultrasound on 06/25/2013. Large external hemorrhoids were seen on external anal exam. A firm mass was palpated on rectal exam. The mass was seen from 8-15 cm from the anal verge on endoscopy. Ultrasound confirmed a tumor to penetrate through the muscular propria into may bring connective tissues. Several small rounded malignant-appearing peritumoral lymph nodes were then applied. The tumor was staged as a T4 N2 lesion by ultrasound.  He was for her to Dr. Johney Maine and was confirmed to have a rectal mass beginning approximately 7 cm proximal to the anal sphincter.  Staging CTs of the chest, abdomen, and pelvis on 06/20/2013 revealed no suspicious pulmonary nodules or masses. The liver and adrenal glands appeared normal. No  lymphadenopathy in the abdomen. A mass was noted in the upper rectum extending into the adjacent fat. No evidence of adjacent organ involvement. One lymph node was seen in the sigmoid mesocolon measuring 8 mm. A few lymph nodes were seen in the left obturator and internal iliac chains with the largest measuring 9 mm. No other pelvic lymphadenopathy. No evidence of metastatic disease in the abdomen or chest.  Mr. Gabay has seen Dr. Lisbeth Renshaw and is scheduled to undergo radiation simulation 07/04/2013.  Past Medical History  Diagnosis Date  . Rectal adenocarcinoma (7cm posterior) 06/16/2013  . Kyphoscoliosis deformity of spine   .  hemorrhoids        . Cataract     surgery 2 years ago    Past Surgical History  Procedure Laterality Date  . I&d extremity  06/07/2011    Procedure: IRRIGATION AND DEBRIDEMENT EXTREMITY;  Surgeon: Linna Hoff, MD;  Location: [facility];  Service: Orthopedics;  Laterality: Left;  . Amputation  06/07/2011    Procedure: AMPUTATION DIGIT;  Surgeon: Linna Hoff, MD;  Location: [facility];  Service: Orthopedics;  Laterality: Left;  Revision multiple fingers index and middle   . Eus N/A 06/25/2013    Procedure: LOWER ENDOSCOPIC ULTRASOUND (EUS);  Surgeon: Arta Silence, MD;  Location: [facility];  Service: Endoscopy;  Laterality: N/A;  . Colonoscopy w/ biopsies  05/29/13  . Eus  06/25/13    Family History  Problem Relation Age of Onset  . Lung cancer Father   . Kidney cancer Brother   . Cancer - Other Mother     "upper stomach"  .  Lung cancer Sister    .    Colon cancer                                   Sister  .    Colon cancer                                   Nephew  5 sisters, one brother, 4 children. No other family history of cancer   Current outpatient prescriptions:loperamide (IMODIUM A-D) 2 MG tablet, Take 2 mg by mouth 4 (four) times daily as needed for diarrhea or loose stools., Disp: , Rfl: ;  Multiple Vitamin (MULTIVITAMIN) tablet, Take 1 tablet by mouth  daily., Disp: , Rfl:   Allergies: No Known Allergies  Social History: He worked in [employer]. He lives in [place]. He smoked one pack of cigarettes per day. Moderate alcohol use. No risk factor for HIV or hepatitis.   ROS:   Positives include: 25 pound weight loss, "diarrhea ", rectal bleeding, rectal pain  A complete ROS was otherwise negative.  Physical Exam:  Blood pressure 127/59, pulse 70, temperature 98.2 F (36.8 C), temperature source Oral, resp. rate 18, height 5' 11.5" (1.816 m), weight 155 lb 8 oz (70.534 kg), SpO2 98.00%.  HEENT: Edentulous, oropharynx without visible mass, neck without mass Lungs: Clear bilaterally Cardiac: Regular rate and rhythm Abdomen: No hepatosplenomegaly, nontender, no mass GU: Uncircumcised male, testes without mass, atrophy of the left testicle  Vascular: No leg edema Lymph nodes: No cervical, supra-clavicular, axillary, or inguinal nodes Neurologic: Alert and oriented, the motor exam appears intact in the upper and lower extremities Skin: Multiple benign appearing moles over the trunk   LAB:  CBC  CMP      Component Value Date/Time   NA 141 06/19/2013 1610   K 4.3 06/19/2013 1610   CL 104 06/19/2013 1610   CO2 30 06/19/2013 1610   GLUCOSE 102* 06/19/2013 1610   BUN 10 06/19/2013 1610   CREATININE 0.70 06/19/2013 1610   CREATININE 1.00 06/07/2011 2029   CALCIUM 8.6 06/19/2013 1610   PROT 6.0 06/19/2013 1610   ALBUMIN 3.6 06/19/2013 1610   AST 13 06/19/2013 1610   ALT 8 06/19/2013 1610   ALKPHOS 73 06/19/2013 1610   BILITOT 0.4 06/19/2013 1610   GFRNONAA >60 03/05/2007 1105   GFRAA  Value: >60        The eGFR has been calculated using the MDRD equation. This calculation has not been validated in all clinical 03/05/2007 1105   CEA 1.2 on 06/16/2013   Radiology: As per history of present illness   Assessment/Plan:   1. Clinical stage III (MD9LR3) adenocarcinoma of the rectum  2. Ongoing tobacco use  3. "Diarrhea "and rectal bleeding  secondary to #1  4. Multiple colon polyps on a colonoscopy 05/29/2013, tubular adenomas and a tubulovillous adenoma  5. Family history multiple cancers   Disposition:   Mr. Hodgman has been diagnosed with rectal cancer. I discussed the diagnosis and treatment options with Mr. Loewe and his family. I reviewed the rationale behind neoadjuvant chemotherapy and radiation. He appears to be a candidate for neoadjuvant chemotherapy/radiation followed by surgery. His case was presented at the GI tumor conference earlier this week and the recommendation is to proceed with neoadjuvant therapy. He understands the likelihood of needing a temporary ileostomy.  He  saw Dr. Lisbeth Renshaw and is scheduled to begin radiation 07/14/2013. I recommend concurrent capecitabine chemotherapy. We reviewed the potential toxicities associated with capecitabine including the chance for mucositis, diarrhea, and hematologic toxicity. We discussed the hyperpigmentation, rash, and hand/foot syndrome associated with capecitabine. He will attend a chemotherapy teaching class.  We will check a CBC when he returns for chemotherapy teaching class. He will be scheduled for an office visit on 07/28/2013.  He has a family history of cancer. We will check the tumor for mismatch repair protein expression and microsatellite stability.  Approximately 50 minutes were spent with patient today. The majority of the time was used for counseling and coordination of care.  [place], [place] 07/03/2013, 2:33 PM

## 2013-07-03 NOTE — Progress Notes (Signed)
Checked in new pt with no financial concerns. °

## 2013-07-04 ENCOUNTER — Other Ambulatory Visit (HOSPITAL_BASED_OUTPATIENT_CLINIC_OR_DEPARTMENT_OTHER): Payer: Medicare Other

## 2013-07-04 ENCOUNTER — Telehealth: Payer: Self-pay | Admitting: *Deleted

## 2013-07-04 ENCOUNTER — Other Ambulatory Visit: Payer: Medicare Other

## 2013-07-04 ENCOUNTER — Ambulatory Visit
Admission: RE | Admit: 2013-07-04 | Discharge: 2013-07-04 | Disposition: A | Discharge status: Home / Self Care | Payer: Medicare Other | Source: Ambulatory Visit | Attending: Radiation Oncology | Admitting: Radiation Oncology

## 2013-07-04 ENCOUNTER — Encounter: Payer: Self-pay | Admitting: Oncology

## 2013-07-04 DIAGNOSIS — C20 Malignant neoplasm of rectum: Secondary | ICD-10-CM

## 2013-07-04 DIAGNOSIS — K644 Residual hemorrhoidal skin tags: Secondary | ICD-10-CM | POA: Insufficient documentation

## 2013-07-04 DIAGNOSIS — K5900 Constipation, unspecified: Secondary | ICD-10-CM | POA: Insufficient documentation

## 2013-07-04 DIAGNOSIS — Z79899 Other long term (current) drug therapy: Secondary | ICD-10-CM | POA: Insufficient documentation

## 2013-07-04 DIAGNOSIS — Z510 Encounter for antineoplastic radiation therapy: Secondary | ICD-10-CM | POA: Insufficient documentation

## 2013-07-04 DIAGNOSIS — Y842 Radiological procedure and radiotherapy as the cause of abnormal reaction of the patient, or of later complication, without mention of misadventure at the time of the procedure: Secondary | ICD-10-CM | POA: Insufficient documentation

## 2013-07-04 DIAGNOSIS — L988 Other specified disorders of the skin and subcutaneous tissue: Secondary | ICD-10-CM | POA: Insufficient documentation

## 2013-07-04 LAB — CBC WITH DIFFERENTIAL/PLATELET
BASO%: 0.2 % (ref 0.0–2.0)
Basophils Absolute: 0 10*3/uL (ref 0.0–0.1)
EOS%: 4.4 % (ref 0.0–7.0)
Eosinophils Absolute: 0.3 10*3/uL (ref 0.0–0.5)
HCT: 34.4 % — ABNORMAL LOW (ref 38.4–49.9)
HGB: 11.5 g/dL — ABNORMAL LOW (ref 13.0–17.1)
LYMPH%: 16.4 % (ref 14.0–49.0)
MCH: 30.7 pg (ref 27.2–33.4)
MCHC: 33.4 g/dL (ref 32.0–36.0)
MCV: 92.1 fL (ref 79.3–98.0)
MONO#: 0.9 10*3/uL (ref 0.1–0.9)
MONO%: 12.3 % (ref 0.0–14.0)
NEUT%: 66.7 % (ref 39.0–75.0)
NEUTROS ABS: 5 10*3/uL (ref 1.5–6.5)
Platelets: 189 10*3/uL (ref 140–400)
RBC: 3.73 10*6/uL — ABNORMAL LOW (ref 4.20–5.82)
RDW: 15.1 % — AB (ref 11.0–14.6)
WBC: 7.6 10*3/uL (ref 4.0–10.3)
lymph#: 1.2 10*3/uL (ref 0.9–3.3)

## 2013-07-04 NOTE — Progress Notes (Signed)
No date for appt as of today.

## 2013-07-04 NOTE — Telephone Encounter (Signed)
VM requesting clarification on Xeloda script: What are days of radiation? Quantity? Called back and left VM that RT is M-F and he will need #180 tabs for 6 weeks of treatment. If insurance does not allow, then fill #120 and refill X 1 for quantity of #60.

## 2013-07-04 NOTE — Telephone Encounter (Signed)
Per request of Dr. Benay Spice,  MSI/IHC testing ordered on Accession # [id number].  Spoke with K. Sharlett Iles in pathology.

## 2013-07-07 ENCOUNTER — Telehealth: Payer: Self-pay | Admitting: *Deleted

## 2013-07-07 NOTE — Progress Notes (Signed)
Radiation Oncology         [phone] ________________________________  Name: Denard Tuminello MRN: [id number]  Date: 07/02/2013  DOB: 1935-03-04  ZH:[id number],ASTMHD Danne Baxter, MD  Johney Maine Revonda Standard., MD   G. Kavin Leech, M.D.  REFERRING PHYSICIAN: Adin Hector., MD   DIAGNOSIS: The encounter diagnosis was Rectal adenocarcinoma.   HISTORY OF PRESENT ILLNESS::Salathiel Proby is a 79 y.o. male who is seen for an initial consultation visit. The patient began experiencing some bowel changes in late 2014. He indicates that he experienced some diarrhea for Thanksgiving. This also has been associated with some increased rectal pain and rectal bleeding more recently. The patient does have a history of hemorrhoids. The patient was referred for further evaluation and he underwent a colonoscopy on 05/29/2013. Hemorrhoids were noted on anal exam. Also a firm rectal mass was palpated. Some polyps were also removed from the base in the colon which were benign. A rectal mass was seen beginning at 5 cm from the anal verge and extending 10 cm proximally. Biopsies were taken and the area was tattooed. Pathology returned positive for a well differentiated invasive adenocarcinoma.  The patient was referred for endoscopic ultrasound and he underwent this procedure on 06/25/2012. The mass was again seen from 8-15 cm from the anal verge on endoscopy. Ultrasound indicated that the tumor corresponded to a T4 N2 lesion.  The patient has undergone staging CT scans of the chest abdomen and pelvis. No evidence of metastatic disease was seen within the abdomen or pelvis. A constricting mass was noted within the upper rectum with local tumor extension into the perirectal fat as well as small lymph nodes in the left obturator and internal iliac regions as well as the sigmoid needs a colon. These are suspicious it is felt for local lymph node metastasis.   The patient's case has been discussed at multidisciplinary GI  conference. He is felt to be a good candidate for neoadjuvant chemoradiation treatment and I have been asked to see the patient today for consideration of radiation treatment as part of his overall treatment plan.   PREVIOUS RADIATION THERAPY: No   PAST MEDICAL HISTORY:  has a past medical history of Rectal adenocarcinoma (7cm posterior) (06/16/2013); Kyphoscoliosis deformity of spine; Colon cancer (05/29/13); and Cataract.     PAST SURGICAL HISTORY: Past Surgical History  Procedure Laterality Date  . I&d extremity  06/07/2011    Procedure: IRRIGATION AND DEBRIDEMENT EXTREMITY;  Surgeon: Linna Hoff, MD;  Location: [facility];  Service: Orthopedics;  Laterality: Left;  . Amputation  06/07/2011    Procedure: AMPUTATION DIGIT;  Surgeon: Linna Hoff, MD;  Location: [facility];  Service: Orthopedics;  Laterality: Left;  Revision multiple fingers index and middle   . Eus N/A 06/25/2013    Procedure: LOWER ENDOSCOPIC ULTRASOUND (EUS);  Surgeon: Arta Silence, MD;  Location: [facility];  Service: Endoscopy;  Laterality: N/A;  . Colonoscopy w/ biopsies  05/29/13  . Eus  06/25/13     FAMILY HISTORY: family history includes Cancer - Other in his mother; Kidney cancer in his brother; Lung cancer in his father and sister.   SOCIAL HISTORY:  reports that he has been smoking Cigarettes.  He has a 64 pack-year smoking history. He does not have any smokeless tobacco history on file. He reports that he drinks about 3.0 ounces of alcohol per week. He reports that he does not use illicit drugs.   ALLERGIES: Review of patient's allergies indicates no known allergies.   MEDICATIONS:  Current Outpatient Prescriptions  Medication Sig Dispense Refill  . loperamide (IMODIUM A-D) 2 MG tablet Take 2 mg by mouth 4 (four) times daily as needed for diarrhea or loose stools.      . Multiple Vitamin (MULTIVITAMIN) tablet Take 1 tablet by mouth daily.      . capecitabine (XELODA) 500 MG tablet Take 3 tablets (= 1500mg )  in AM.  Take 3 tablets (=1500mg ) in PM.  Total of 3000 mg daily on days of Radiation Only.  168 tablet  0   No current facility-administered medications for this encounter.     REVIEW OF SYSTEMS:  A 15 point review of systems is documented in the electronic medical record. This was obtained by the nursing staff. However, I reviewed this with the patient to discuss relevant findings and make appropriate changes.  Pertinent items are noted in HPI.    PHYSICAL EXAM:  vitals were not taken for this visit.  ECOG = 1  0 - Asymptomatic (Fully active, able to carry on all predisease activities without restriction)  1 - Symptomatic but completely ambulatory (Restricted in physically strenuous activity but ambulatory and able to carry out work of a light or sedentary nature. For example, light housework, office work)  2 - Symptomatic, <50% in bed during the day (Ambulatory and capable of all self care but unable to carry out any work activities. Up and about more than 50% of waking hours)  3 - Symptomatic, >50% in bed, but not bedbound (Capable of only limited self-care, confined to bed or chair 50% or more of waking hours)  4 - Bedbound (Completely disabled. Cannot carry on any self-care. Totally confined to bed or chair)  5 - Death   Eustace Pen MM, Creech RH, Tormey DC, et al. [phone]). "Toxicity and response criteria of the [facility]". [place] Oncol. 5 (6): 649-55  General: Well-developed, in no acute distress HEENT: Normocephalic, atraumatic; oral cavity clear Neck: Supple without any lymphadenopathy Cardiovascular: Regular rate and rhythm Respiratory: Clear to auscultation bilaterally GI: Soft, nontender, normal bowel sounds Extremities: No edema present Neuro: No focal deficits Rectal:   A firm mass is noted on digital rectal exam which is palpable at the end of the examination digit. This was a circumferential. No blood on exam glove.   LABORATORY DATA:  Lab  Results  Component Value Date   WBC 7.6 07/04/2013   HGB 11.5* 07/04/2013   HCT 34.4* 07/04/2013   MCV 92.1 07/04/2013   PLT 189 07/04/2013   Lab Results  Component Value Date   NA 141 06/19/2013   K 4.3 06/19/2013   CL 104 06/19/2013   CO2 30 06/19/2013   Lab Results  Component Value Date   ALT 8 06/19/2013   AST 13 06/19/2013   ALKPHOS 73 06/19/2013   BILITOT 0.4 06/19/2013    CEA:  1.2  RADIOGRAPHY: Ct Chest W Contrast  06/20/2013   CLINICAL DATA:  Newly diagnosed rectal adenocarcinoma.  Staging.  EXAM: CT CHEST, ABDOMEN, AND PELVIS WITH CONTRAST  TECHNIQUE: Multidetector CT imaging of the chest, abdomen and pelvis was performed following the standard protocol during bolus administration of intravenous contrast.  CONTRAST:  115mL OMNIPAQUE IOHEXOL 300 MG/ML  SOLN  COMPARISON:  None.  FINDINGS: CT CHEST FINDINGS  No evidence of hilar or mediastinal masses. No adenopathy seen elsewhere within the thorax. Calcified right hilar lymph nodes seen, consistent with old granulomatous disease.  Calcified granuloma noted in the right lower lobe. No  suspicious pulmonary nodules or masses are identified. Mild atelectasis or scarring is noted in the right lower lobe. No evidence of pulmonary infiltrate or central endobronchial lesion. No evidence of pleural or pericardial effusion. No suspicious bone lesions are identified. Several old right rib fracture deformities incidentally noted.  CT ABDOMEN AND PELVIS FINDINGS  The liver, gallbladder, pancreas, spleen, and adrenal glands are normal in appearance. Both kidneys are normal in appearance there is no evidence of hydronephrosis. No abdominal soft tissue masses or lymphadenopathy identified.  An annular constricting mass is seen involving the upper rectum, measuring approximately 3.6 x 5.5 cm. This mass shows spiculated margins indicating extension into adjacent perirectal fat. No evidence of adjacent organ involvement. At least 1 lymph node is seen in the sigmoid mesocolon  measuring 8 mm on image 101. A few lymph nodes are seen in the left obturator and internal iliac chains, largest measuring 9 mm on image 110. This is suspicious for local lymph node metastases. No other pelvic lymphadenopathy identified.  No evidence of inflammatory process or abnormal fluid collections within the abdomen or pelvis. Large colonic stool burden noted. No evidence of small bowel dilatation. No suspicious bone lesions identified.  IMPRESSION: Annular constricting mass involving the upper rectum, consistent with known rectal carcinoma. Local tumor extension into the perirectal fat noted, as well as small lymph nodes in the left obturator and internal iliac regions and sigmoid mesocolon, suspicious for local lymph node metastases.  No evidence of metastatic disease within the abdomen or chest.   Electronically Signed   By: Earle Gell M.D.   On: 06/20/2013 09:22   Ct Abdomen Pelvis W Contrast  06/20/2013   CLINICAL DATA:  Newly diagnosed rectal adenocarcinoma.  Staging.  EXAM: CT CHEST, ABDOMEN, AND PELVIS WITH CONTRAST  TECHNIQUE: Multidetector CT imaging of the chest, abdomen and pelvis was performed following the standard protocol during bolus administration of intravenous contrast.  CONTRAST:  118mL OMNIPAQUE IOHEXOL 300 MG/ML  SOLN  COMPARISON:  None.  FINDINGS: CT CHEST FINDINGS  No evidence of hilar or mediastinal masses. No adenopathy seen elsewhere within the thorax. Calcified right hilar lymph nodes seen, consistent with old granulomatous disease.  Calcified granuloma noted in the right lower lobe. No suspicious pulmonary nodules or masses are identified. Mild atelectasis or scarring is noted in the right lower lobe. No evidence of pulmonary infiltrate or central endobronchial lesion. No evidence of pleural or pericardial effusion. No suspicious bone lesions are identified. Several old right rib fracture deformities incidentally noted.  CT ABDOMEN AND PELVIS FINDINGS  The liver, gallbladder,  pancreas, spleen, and adrenal glands are normal in appearance. Both kidneys are normal in appearance there is no evidence of hydronephrosis. No abdominal soft tissue masses or lymphadenopathy identified.  An annular constricting mass is seen involving the upper rectum, measuring approximately 3.6 x 5.5 cm. This mass shows spiculated margins indicating extension into adjacent perirectal fat. No evidence of adjacent organ involvement. At least 1 lymph node is seen in the sigmoid mesocolon measuring 8 mm on image 101. A few lymph nodes are seen in the left obturator and internal iliac chains, largest measuring 9 mm on image 110. This is suspicious for local lymph node metastases. No other pelvic lymphadenopathy identified.  No evidence of inflammatory process or abnormal fluid collections within the abdomen or pelvis. Large colonic stool burden noted. No evidence of small bowel dilatation. No suspicious bone lesions identified.  IMPRESSION: Annular constricting mass involving the upper rectum, consistent with known rectal  carcinoma. Local tumor extension into the perirectal fat noted, as well as small lymph nodes in the left obturator and internal iliac regions and sigmoid mesocolon, suspicious for local lymph node metastases.  No evidence of metastatic disease within the abdomen or chest.   Electronically Signed   By: Earle Gell M.D.   On: 06/20/2013 09:22       IMPRESSION: The patient has a new diagnosis of adenocarcinoma of the rectum. This corresponds to a stage III tumor: T4N2M0.  This extends from approximately 5 cm in the anal verge to 15 cm from the anal verge with some suspicious regional/local lymph nodes.  The patient I believe is an appropriate candidate for neoadjuvant chemoradiation treatment. He is scheduled to be seen later this week by Dr. Benay Spice and medical oncology. I discussed with the patient the rationale of radiation treatment this setting. I discussed with him a potential 5-1/2 week  course of radiation treatment with anticipated concurrent chemotherapy. We discussed the possible/expected benefit of radiation treatment in this setting. We also discussed the possible side effects and risks of treatment as well. All of his questions were answered. The patient does wish to proceed with this treatment plan.   PLAN: The patient will be scheduled for a simulation later this week such that we can proceed with treatment planning. I anticipate beginning the patient's course of radiation treatment on 07/14/2013.       ________________________________   Jodelle Gross, MD, PhD

## 2013-07-07 NOTE — Telephone Encounter (Signed)
Co-pay for #90 Xeloda pills = $474.12. Asking for permission to reach out to patient to see if he can afford this or needs referral to co pay assist. Called [facility] at [employer] and approved to call patient.

## 2013-07-07 NOTE — Progress Notes (Signed)
  Radiation Oncology         [phone] ________________________________  Name: Richard [name] MRN: [id number]  Date: 07/04/2013  DOB: [birth year]  SIMULATION AND TREATMENT PLANNING NOTE  The patient presented for simulation for the patient's upcoming course of preoperative radiation for the diagnosis of rectal cancer. The patient was placed in a supine position. A customized alpha cradle was constructed toaid in patient immobilization. This complex treatment device will be used on a daily basis during the treatment. In this fashion a CT scan was obtained through the pelvic region and the isocenter was placed near midline within the pelvis.  The patient will initially be planned to receive a course of radiation to a dose of 45 gray. This will be accomplished in 25 fractions at 1.8 gray per fraction. This initial treatment will correspond to a 3-D conformal technique. The gross tumor volume has been contoured in addition to the rectum, bladder and femoral heads. DVH's of each of these structures have been requested and these will be carefully reviewed as part of the 3-D conformal treatment planning process. To accomplish this initial treatment, 4 customized blocks have been designed for this purpose. Each of these 4 complex treatment devices will be used on a daily basis during the initial course of his treatment. It is anticipated that the patient will then receive a boost for an additional 5.4 gray. The anticipated total dose therefore will be 50.4 gray.  Special treatment procedure The patient will receive chemotherapy during the course of radiation treatment. The patient may experience increased or overlapping toxicity due to this combined-modality approach and the patient will be monitored for such problems. This may include extra lab work as necessary. This therefore constitutes a special treatment procedure.    ________________________________  Jodelle Gross, MD, PhD

## 2013-07-08 ENCOUNTER — Telehealth: Payer: Self-pay | Admitting: *Deleted

## 2013-07-08 NOTE — Telephone Encounter (Signed)
Patient called and stated she has not heard from anyone re: Xeloda or where it is going to come from.  Dr. Gearldine Shown nurse and drug specialist notified.  Patient's wife understands to contact office by 07/09/13 if they have not heard anything.

## 2013-07-11 NOTE — Telephone Encounter (Signed)
RECEIVED A FAX FROM [employer] CONCERNING A CONFIRMATION OF PRESCRIPTION SHIPMENT FOR CAPECITABINE ON 07/10/13. 

## 2013-07-14 ENCOUNTER — Ambulatory Visit
Admission: RE | Admit: 2013-07-14 | Discharge: 2013-07-14 | Disposition: A | Discharge status: Home / Self Care | Payer: Medicare Other | Source: Ambulatory Visit | Attending: Radiation Oncology | Admitting: Radiation Oncology

## 2013-07-14 ENCOUNTER — Encounter: Payer: Self-pay | Admitting: Oncology

## 2013-07-14 ENCOUNTER — Encounter: Payer: Self-pay | Admitting: Radiation Oncology

## 2013-07-14 NOTE — Progress Notes (Signed)
Per [place] at [facility]. The patient was approved for Xeloda. 07/09/13-07/09/14  7500.00 I will let billing and medical records know.

## 2013-07-14 NOTE — Progress Notes (Signed)
  Radiation Oncology         [phone] ________________________________  Name: Richard [name] MRN: [id number]  Date: 07/14/2013  DOB: Mar 14, 1935  Simulation Verification Note  Status: outpatient  NARRATIVE: The patient was brought to the treatment unit and placed in the planned treatment position. The clinical setup was verified. Then port films were obtained and uploaded to the radiation oncology medical record software.  The treatment beams were carefully compared against the planned radiation fields. The position location and shape of the radiation fields was reviewed. They targeted volume of tissue appears to be appropriately covered by the radiation beams. Organs at risk appear to be excluded as planned.  Based on my personal review, I approved the simulation verification. The patient's treatment will proceed as planned.  -----------------------------------  Blair Promise, PhD, MD

## 2013-07-15 ENCOUNTER — Telehealth: Payer: Self-pay | Admitting: *Deleted

## 2013-07-15 ENCOUNTER — Ambulatory Visit
Admission: RE | Admit: 2013-07-15 | Discharge: 2013-07-15 | Disposition: A | Discharge status: Home / Self Care | Payer: Medicare Other | Source: Ambulatory Visit | Attending: Radiation Oncology | Admitting: Radiation Oncology

## 2013-07-15 MED ORDER — ONDANSETRON HCL 8 MG PO TABS: 8.0000 mg | ORAL_TABLET | Freq: Two times a day (BID) | ORAL | Status: DC | PRN

## 2013-07-15 NOTE — Telephone Encounter (Signed)
Call from pt's wife reporting nausea. Began chemoradiation with Xeloda on 07/14/13. Reviewed with Dr. Benay Spice: Order received for Zofran 8 mg BID PRN nausea.

## 2013-07-16 ENCOUNTER — Ambulatory Visit
Admission: RE | Admit: 2013-07-16 | Discharge: 2013-07-16 | Disposition: A | Discharge status: Home / Self Care | Payer: Medicare Other | Source: Ambulatory Visit | Attending: Radiation Oncology | Admitting: Radiation Oncology

## 2013-07-16 ENCOUNTER — Encounter: Payer: Self-pay | Admitting: Radiation Oncology

## 2013-07-16 VITALS — BP 126/81 | HR 87 | Temp 99.0°F | Resp 20 | Wt 159.3 lb

## 2013-07-16 DIAGNOSIS — C20 Malignant neoplasm of rectum: Secondary | ICD-10-CM

## 2013-07-16 NOTE — Progress Notes (Signed)
Pt denies pain, fatigue, diarrhea, loss of appetite, nausea. Post sim ed completed w/pt and daughter. Gave pt "Radiation and You " booklet w/all pertinent information marked and discussed, re: diarrhea/management, fatigue, skin irritation/care, urinary/bladder irritation/care, nausea/vomiting, pain, nutrition. All questions answered; pt and daughter verbalized understanding. Informed them to look over information and discuss w/Janice, RN tomorrow if needed. Pt and daughter given treatment calendar with explanation.

## 2013-07-16 NOTE — Progress Notes (Signed)
  Radiation Oncology         [phone] ________________________________  Name: Richard [name] MRN: [id number]  Date: 07/16/2013  DOB: [birth year]  Weekly Radiation Therapy Management  Current Dose: 5.4 Gy     Planned Dose:  45 Gy  Narrative . . . . . . . . The patient presents for routine under treatment assessment.                                   The patient is without complaint.  He did start chemotherapy and is tolerating this well.                                 Set-up films were reviewed.                                 The chart was checked. Physical Findings. . .  weight is 159 lb 4.8 oz (72.258 kg). His oral temperature is 99 F (37.2 C). His blood pressure is 126/81 and his pulse is 87. His respiration is 20. . Weight essentially stable.  No significant changes. Impression . . . . . . . The patient is tolerating radiation. Plan . . . . . . . . . . . . Continue treatment as planned.  ________________________________   Blair Promise, PhD, MD

## 2013-07-17 ENCOUNTER — Ambulatory Visit
Admission: RE | Admit: 2013-07-17 | Discharge: 2013-07-17 | Disposition: A | Discharge status: Home / Self Care | Payer: Medicare Other | Source: Ambulatory Visit | Attending: Radiation Oncology | Admitting: Radiation Oncology

## 2013-07-17 DIAGNOSIS — C20 Malignant neoplasm of rectum: Secondary | ICD-10-CM

## 2013-07-17 NOTE — Progress Notes (Signed)
Patient given sitz bath, instructed how to use as needed to patient and daughter Maudie Mercury,  No other questions asked, pt educated yesterday with Marletta Lor 4:12 PM

## 2013-07-18 ENCOUNTER — Ambulatory Visit
Admission: RE | Admit: 2013-07-18 | Discharge: 2013-07-18 | Disposition: A | Discharge status: Home / Self Care | Payer: Medicare Other | Source: Ambulatory Visit | Attending: Radiation Oncology | Admitting: Radiation Oncology

## 2013-07-21 ENCOUNTER — Ambulatory Visit
Admission: RE | Admit: 2013-07-21 | Discharge: 2013-07-21 | Disposition: A | Discharge status: Home / Self Care | Payer: Medicare Other | Source: Ambulatory Visit | Attending: Radiation Oncology | Admitting: Radiation Oncology

## 2013-07-21 ENCOUNTER — Other Ambulatory Visit (HOSPITAL_BASED_OUTPATIENT_CLINIC_OR_DEPARTMENT_OTHER): Payer: Medicare Other

## 2013-07-21 DIAGNOSIS — C20 Malignant neoplasm of rectum: Secondary | ICD-10-CM

## 2013-07-21 LAB — CBC WITH DIFFERENTIAL/PLATELET
BASO%: 0.1 % (ref 0.0–2.0)
Basophils Absolute: 0 10*3/uL (ref 0.0–0.1)
EOS%: 1.4 % (ref 0.0–7.0)
Eosinophils Absolute: 0.1 10*3/uL (ref 0.0–0.5)
HCT: 32.7 % — ABNORMAL LOW (ref 38.4–49.9)
HGB: 10.9 g/dL — ABNORMAL LOW (ref 13.0–17.1)
LYMPH%: 10.9 % — ABNORMAL LOW (ref 14.0–49.0)
MCH: 30.8 pg (ref 27.2–33.4)
MCHC: 33.2 g/dL (ref 32.0–36.0)
MCV: 92.7 fL (ref 79.3–98.0)
MONO#: 0.7 10*3/uL (ref 0.1–0.9)
MONO%: 11.8 % (ref 0.0–14.0)
NEUT#: 4.7 10*3/uL (ref 1.5–6.5)
NEUT%: 75.8 % — ABNORMAL HIGH (ref 39.0–75.0)
Platelets: 181 10*3/uL (ref 140–400)
RBC: 3.53 10*6/uL — AB (ref 4.20–5.82)
RDW: 15.3 % — ABNORMAL HIGH (ref 11.0–14.6)
WBC: 6.1 10*3/uL (ref 4.0–10.3)
lymph#: 0.7 10*3/uL — ABNORMAL LOW (ref 0.9–3.3)

## 2013-07-22 ENCOUNTER — Ambulatory Visit
Admission: RE | Admit: 2013-07-22 | Discharge: 2013-07-22 | Disposition: A | Discharge status: Home / Self Care | Payer: Medicare Other | Source: Ambulatory Visit | Attending: Radiation Oncology | Admitting: Radiation Oncology

## 2013-07-23 ENCOUNTER — Ambulatory Visit
Admission: RE | Admit: 2013-07-23 | Discharge: 2013-07-23 | Disposition: A | Discharge status: Home / Self Care | Payer: Medicare Other | Source: Ambulatory Visit | Attending: Radiation Oncology | Admitting: Radiation Oncology

## 2013-07-23 ENCOUNTER — Telehealth: Payer: Self-pay | Admitting: *Deleted

## 2013-07-23 MED ORDER — DOCUSATE SODIUM 50 MG PO CAPS: 50.0000 mg | ORAL_CAPSULE | Freq: Two times a day (BID) | ORAL | Status: DC

## 2013-07-23 NOTE — Telephone Encounter (Signed)
Wife asking what to do about hard stools alternating with diarrhea? Also asking if he can shower with unscented Dove?

## 2013-07-23 NOTE — Telephone Encounter (Signed)
Called wife and instructed her to start him on OTC colace 1-2/day. OK to use Dove in shower. She is asking for his surgery to be done after her trip June 26th to [place]. Suggested she discuss this at next visit.

## 2013-07-24 ENCOUNTER — Telehealth: Payer: Self-pay | Admitting: *Deleted

## 2013-07-24 ENCOUNTER — Ambulatory Visit
Admission: RE | Admit: 2013-07-24 | Discharge: 2013-07-24 | Disposition: A | Discharge status: Home / Self Care | Payer: Medicare Other | Source: Ambulatory Visit | Attending: Radiation Oncology | Admitting: Radiation Oncology

## 2013-07-24 VITALS — BP 136/76 | HR 80 | Temp 98.6°F

## 2013-07-24 DIAGNOSIS — C20 Malignant neoplasm of rectum: Secondary | ICD-10-CM

## 2013-07-24 NOTE — Progress Notes (Signed)
Richard [name] has had 9 fractions to his pelvis/rectum.  He denies pain. He reports having very hard stool which turns to liquid and then he notices blood.  He reports passing clots which he has brought with him today.  He is currently taking Xeloda.  He denies nausea.

## 2013-07-24 NOTE — Progress Notes (Signed)
   Department of Radiation Oncology  Phone:  [phone] Fax:        [phone]  Weekly Treatment Note    Name: Richard [name] Date: 07/24/2013 MRN: [id number] DOB: 11-20-1934   Current dose: 16.2 Gy  Current fraction: 9   MEDICATIONS: Current Outpatient Prescriptions  Medication Sig Dispense Refill  . capecitabine (XELODA) 500 MG tablet Take 3 tablets (= 1500mg ) in AM.  Take 3 tablets (=1500mg ) in PM.  Total of 3000 mg daily on days of Radiation Only.  168 tablet  0  . docusate sodium (COLACE) 50 MG capsule Take 1 capsule (50 mg total) by mouth 2 (two) times daily.  10 capsule  0  . loperamide (IMODIUM A-D) 2 MG tablet Take 2 mg by mouth 4 (four) times daily as needed for diarrhea or loose stools.      . Multiple Vitamin (MULTIVITAMIN) tablet Take 1 tablet by mouth daily.      . ondansetron (ZOFRAN) 8 MG tablet Take 1 tablet (8 mg total) by mouth 2 (two) times daily as needed for nausea or vomiting.  20 tablet  1   No current facility-administered medications for this encounter.     ALLERGIES: Review of patient's allergies indicates no known allergies.   LABORATORY DATA:  Lab Results  Component Value Date   WBC 6.1 07/21/2013   HGB 10.9* 07/21/2013   HCT 32.7* 07/21/2013   MCV 92.7 07/21/2013   PLT 181 07/21/2013   Lab Results  Component Value Date   NA 141 06/19/2013   K 4.3 06/19/2013   CL 104 06/19/2013   CO2 30 06/19/2013   Lab Results  Component Value Date   ALT 8 06/19/2013   AST 13 06/19/2013   ALKPHOS 73 06/19/2013   BILITOT 0.4 06/19/2013     NARRATIVE: Richard [name] was seen today for weekly treatment management. The chart was checked and the patient's films were reviewed. The patient asked to be seen today. He has some blood including a clot after a hard bowel movement today. He is gone since then and did note a small amount of blood. No pain and no other complaints.  PHYSICAL EXAMINATION: temperature is 98.6 F (37 C). His blood pressure is 136/76 and his pulse is  80. His oxygen saturation is 99%.        ASSESSMENT: The patient is doing satisfactorily with treatment.  PLAN: We will continue with the patient's radiation treatment as planned. The patient has had some bleeding but this has not continued today on an ongoing basis. He is to let us know if this persists.

## 2013-07-24 NOTE — Telephone Encounter (Signed)
Pt's wife called states "he has passed a large blood clot; looked like a piece of liver; is this to be expected?"  Wife stated that he has "passed 2 clots in the last hour and a half but it does stop on its on; he says they are hard"  When asked if he has been taking his colace; wife states "I have not been to store yet to pick it up"  Encourage pt's wife to pick-up the colace. Left message with Dr. Ida Rogue RN regarding information since pt has appt in [place] @ 4pm today.   Note to Dr. Benay Spice.

## 2013-07-24 NOTE — Telephone Encounter (Signed)
Per Dr. Benay Spice; called and spoke with pt wife instructing her that MD stated pt should follow-up with GI MD.  Encouraged again to get stool softer for pt and drink plenty of fluids to help with constipation.  Pt's wife verbalized understanding and expressed appreciation for call back.

## 2013-07-25 ENCOUNTER — Ambulatory Visit: Admission: RE | Admit: 2013-07-25 | Payer: Medicare Other | Source: Ambulatory Visit | Admitting: Radiation Oncology

## 2013-07-25 ENCOUNTER — Ambulatory Visit
Admission: RE | Admit: 2013-07-25 | Discharge: 2013-07-25 | Disposition: A | Discharge status: Home / Self Care | Payer: Medicare Other | Source: Ambulatory Visit | Attending: Radiation Oncology | Admitting: Radiation Oncology

## 2013-07-28 ENCOUNTER — Telehealth: Payer: Self-pay | Admitting: Family Medicine

## 2013-07-28 ENCOUNTER — Ambulatory Visit: Payer: Medicare Other | Admitting: Nutrition

## 2013-07-28 ENCOUNTER — Encounter (HOSPITAL_COMMUNITY): Payer: Self-pay

## 2013-07-28 ENCOUNTER — Encounter: Payer: Self-pay | Admitting: Oncology

## 2013-07-28 ENCOUNTER — Ambulatory Visit
Admission: RE | Admit: 2013-07-28 | Discharge: 2013-07-28 | Disposition: A | Discharge status: Home / Self Care | Payer: Medicare Other | Source: Ambulatory Visit | Attending: Radiation Oncology | Admitting: Radiation Oncology

## 2013-07-28 ENCOUNTER — Ambulatory Visit (HOSPITAL_BASED_OUTPATIENT_CLINIC_OR_DEPARTMENT_OTHER): Payer: Medicare Other | Admitting: Nurse Practitioner

## 2013-07-28 VITALS — BP 137/65 | HR 82 | Temp 98.2°F | Resp 18 | Ht 71.5 in | Wt 149.3 lb

## 2013-07-28 DIAGNOSIS — Z809 Family history of malignant neoplasm, unspecified: Secondary | ICD-10-CM

## 2013-07-28 DIAGNOSIS — R634 Abnormal weight loss: Secondary | ICD-10-CM

## 2013-07-28 DIAGNOSIS — F17200 Nicotine dependence, unspecified, uncomplicated: Secondary | ICD-10-CM

## 2013-07-28 DIAGNOSIS — R197 Diarrhea, unspecified: Secondary | ICD-10-CM

## 2013-07-28 DIAGNOSIS — K625 Hemorrhage of anus and rectum: Secondary | ICD-10-CM

## 2013-07-28 DIAGNOSIS — C20 Malignant neoplasm of rectum: Secondary | ICD-10-CM

## 2013-07-28 NOTE — Progress Notes (Signed)
Copy of new approval asst form sent to medical records. [id number] new bal for Xeloda asst 04/10/13-07/09/14.

## 2013-07-28 NOTE — Progress Notes (Signed)
79 year-old male diagnosed with rectal cancer.  He is a patient of Dr. Benay Spice.  Past medical history includes tobacco.  Medications include Xeloda, Colace, immodium, multivitamin, and Zofran.  Height: 5 feet 11-1/2 inches. Weight 149.3 pounds. Usual body weight: 159 pounds on April 1. BMI: 20.54.  Received a call from nurse practitioner requesting I talk with patient today.  Patient has lost 10 pounds in the last 3 weeks.  He has a good appetite but was restricting some of his food intake trying to consume a healthier diet.  Wife was for trying to provide a healthier diet.  He reports moving between diarrhea and constipation.  No other nutrition concerns voiced at this time.  Nutrition diagnosis: Unintended weight loss related to food and nutrition related knowledge deficit as evidenced by 10 pound weight loss in 3 weeks.  Intervention: Patient and wife were educated on consuming smaller, more frequent meals with high-calorie, high-protein foods.  Educated patient and wife there was no need to restrict some of patient's favorite foods.  Also reviewed food safety practices, but encouraged patient to consume raw fruits and vegetables if he prefers.  Patient provided samples of oral nutrition supplements.  Recommended patient consume these 3 times a day between meals.  Questions were answered.  Teach back method used.  Monitoring, evaluation, goals: Patient will tolerate increased calories and protein to minimize further weight loss.  Next visit:  Patient will contact me for questions or concerns.

## 2013-07-28 NOTE — Progress Notes (Signed)
Per PAN the patient gets asst with Xeloda and grant amt has been updated to 7500.00. 04/10/13-07/09/14  New balance for him is [phone]

## 2013-07-28 NOTE — Progress Notes (Signed)
  [facility] OFFICE PROGRESS NOTE   Diagnosis:  Rectal cancer.  INTERVAL HISTORY:   Richard [name] began radiation and Xeloda on 07/14/2013. He denies nausea/vomiting. No mouth sores. He continues to alternate constipation and diarrhea. He continues to have intermittent rectal bleeding. He noted "quite a bit " in the commode on 07/26/2013. He noted a smaller amount the following day and a small amount this morning. Appetite is poor. He is losing weight. He denies rectal pain.  Objective:  Vital signs in last 24 hours:  Blood pressure 137/65, pulse 82, temperature 98.2 F (36.8 C), temperature source Oral, resp. rate 18, height 5' 11.5" (1.816 m), weight 149 lb 4.8 oz (67.722 kg), SpO2 99.00%.    HEENT: No thrush. Resp: Rhonchi left lower lung field. No respiratory distress. Cardio:  Regular cardiac rhythm. GI: Abdomen soft and nontender. No hepatomegaly. Vascular: No leg edema. Skin: Palms nontender and without erythema.  Lab Results:  Lab Results  Component Value Date   WBC 6.1 07/21/2013   HGB 10.9* 07/21/2013   HCT 32.7* 07/21/2013   MCV 92.7 07/21/2013   PLT 181 07/21/2013   NEUTROABS 4.7 07/21/2013      Lab Results  Component Value Date   CEA 1.2 06/16/2013    Imaging:  No results found.  Medications: I have reviewed the patient's current medications.  Assessment/Plan: 1. Clinical stage III (ZT2WP8) adenocarcinoma of the rectum. Tumor microsatellite stable by PCR.   Initiation of neoadjuvant radiation and Xeloda 07/14/2013. 2. Ongoing tobacco use. 3. "Diarrhea" and rectal bleeding secondary to #1. 4. Multiple colon polyps on a colonoscopy 05/29/2013, tubular adenomas and a tubulovillous adenoma. 5. Family history multiple cancers. 6. Weight loss.   Disposition: He continues radiation and Xeloda. He and his wife understand to contact the office with increased diarrhea, rectal bleeding.  He is losing weight. Ernestene Kiel, registered dietitian, met with  Mr. Dosher and his wife at today's visit.  We will see Mr. Jacob in followup on 08/05/2013. He will contact the office in the interim as outlined above or with any other problems.   Plan reviewed with Dr. Benay Spice.    Owens Shark ANP/GNP-BC   07/28/2013  3:33 PM

## 2013-07-28 NOTE — Telephone Encounter (Signed)
gv pt appt schedule for april. pt/wife aware lb/fu on 4/21 mid morning could not be coord w/xrt.

## 2013-07-29 ENCOUNTER — Ambulatory Visit
Admission: RE | Admit: 2013-07-29 | Discharge: 2013-07-29 | Disposition: A | Discharge status: Home / Self Care | Payer: Medicare Other | Source: Ambulatory Visit | Attending: Radiation Oncology | Admitting: Radiation Oncology

## 2013-07-30 ENCOUNTER — Ambulatory Visit
Admission: RE | Admit: 2013-07-30 | Discharge: 2013-07-30 | Disposition: A | Discharge status: Home / Self Care | Payer: Medicare Other | Source: Ambulatory Visit | Attending: Radiation Oncology | Admitting: Radiation Oncology

## 2013-07-31 ENCOUNTER — Ambulatory Visit
Admission: RE | Admit: 2013-07-31 | Discharge: 2013-07-31 | Disposition: A | Discharge status: Home / Self Care | Payer: Medicare Other | Source: Ambulatory Visit | Attending: Radiation Oncology | Admitting: Radiation Oncology

## 2013-08-01 ENCOUNTER — Telehealth: Payer: Self-pay | Admitting: *Deleted

## 2013-08-01 ENCOUNTER — Encounter: Payer: Self-pay | Admitting: *Deleted

## 2013-08-01 ENCOUNTER — Ambulatory Visit
Admission: RE | Admit: 2013-08-01 | Discharge: 2013-08-01 | Disposition: A | Discharge status: Home / Self Care | Payer: Medicare Other | Source: Ambulatory Visit | Attending: Radiation Oncology | Admitting: Radiation Oncology

## 2013-08-01 DIAGNOSIS — C20 Malignant neoplasm of rectum: Secondary | ICD-10-CM

## 2013-08-01 NOTE — Telephone Encounter (Signed)
Message from pt's wife reporting he is having dysuria, frequency and some incontinence. Reviewed with Dr. Benay Spice: likely side effect of radiation, make radiation staff aware. Left message on voicemail for Thayer Headings, RN with Dr. Lisbeth Renshaw to assess pt during visit today.

## 2013-08-01 NOTE — Progress Notes (Signed)
Patient was suppose to be assessed after radiation treatment for dysuria, frequency and some incontinence but he left before being brought to nursing.

## 2013-08-01 NOTE — Progress Notes (Signed)
RECEIVED A FAX FROM [facility] CONCERNING A CONFIRMATION OF PRESCRIPTION SHIPMENT FOR CAPECITABINE ON 07/31/13. 

## 2013-08-04 ENCOUNTER — Ambulatory Visit
Admission: RE | Admit: 2013-08-04 | Discharge: 2013-08-04 | Disposition: A | Discharge status: Home / Self Care | Payer: Medicare Other | Source: Ambulatory Visit | Attending: Radiation Oncology | Admitting: Radiation Oncology

## 2013-08-04 ENCOUNTER — Ambulatory Visit: Payer: Medicare Other | Admitting: Radiation Oncology

## 2013-08-04 ENCOUNTER — Telehealth: Payer: Self-pay | Admitting: *Deleted

## 2013-08-04 DIAGNOSIS — C20 Malignant neoplasm of rectum: Secondary | ICD-10-CM

## 2013-08-04 NOTE — Telephone Encounter (Signed)
Received vm from pt's wife stating "had no BM for 2 days; given stool softener and 2 glasses of prune juice Sunday"  Returned call and spoke with wife; pt coming out of bathroom now and reports he did have "2 small stools"  Pt's wife states that she "got the peri-colace not the colace" at the drugstore and wanted to make sure that's OK for patient.  Instructed pt's wife either one is fine for pt to take and emotional support given.  Pt's wife states "he didn't eat a lot last night because he felt so full; I'll give him another glass of prune juice today"  Confirmed with wife pt appt tomorrow 4/21 with Ned Card, NP.

## 2013-08-04 NOTE — Progress Notes (Signed)
   Department of Radiation Oncology  Phone:  [phone] Fax:        [phone]  Weekly Treatment Note    Name: Richard [name] Date: 08/04/2013 MRN: [id number] DOB: January 30, 1935   Current dose: 27 Gy  Current fraction: 15   MEDICATIONS: Current Outpatient Prescriptions  Medication Sig Dispense Refill  . capecitabine (XELODA) 500 MG tablet Take 3 tablets (= 1500mg ) in AM.  Take 3 tablets (=1500mg ) in PM.  Total of 3000 mg daily on days of Radiation Only.  168 tablet  0  . docusate sodium (COLACE) 50 MG capsule Take 1 capsule (50 mg total) by mouth 2 (two) times daily.  10 capsule  0  . loperamide (IMODIUM A-D) 2 MG tablet Take 2 mg by mouth 4 (four) times daily as needed for diarrhea or loose stools.      . Multiple Vitamin (MULTIVITAMIN) tablet Take 1 tablet by mouth daily.      . ondansetron (ZOFRAN) 8 MG tablet Take 1 tablet (8 mg total) by mouth 2 (two) times daily as needed for nausea or vomiting.  20 tablet  1   No current facility-administered medications for this encounter.     ALLERGIES: Review of patient's allergies indicates no known allergies.   LABORATORY DATA:  Lab Results  Component Value Date   WBC 6.1 07/21/2013   HGB 10.9* 07/21/2013   HCT 32.7* 07/21/2013   MCV 92.7 07/21/2013   PLT 181 07/21/2013   Lab Results  Component Value Date   NA 141 06/19/2013   K 4.3 06/19/2013   CL 104 06/19/2013   CO2 30 06/19/2013   Lab Results  Component Value Date   ALT 8 06/19/2013   AST 13 06/19/2013   ALKPHOS 73 06/19/2013   BILITOT 0.4 06/19/2013     NARRATIVE: Richard [name] was seen today for weekly treatment management. The chart was checked and the patient's films were reviewed. The patient complains of some constipation over the weekend. He has gotten some milk of magnesia from the drugstore and also has been drinking prune juice.  PHYSICAL EXAMINATION:  Alert, in no acute distress  ASSESSMENT: The patient is doing satisfactorily with treatment. Experiencing some  constipation.  PLAN: We will continue with the patient's radiation treatment as planned.  The patient will begin MiraLax if his current regimen does not work.

## 2013-08-05 ENCOUNTER — Ambulatory Visit (HOSPITAL_BASED_OUTPATIENT_CLINIC_OR_DEPARTMENT_OTHER): Payer: Medicare Other | Admitting: Nurse Practitioner

## 2013-08-05 ENCOUNTER — Telehealth: Payer: Self-pay | Admitting: *Deleted

## 2013-08-05 ENCOUNTER — Telehealth: Payer: Self-pay | Admitting: Oncology

## 2013-08-05 ENCOUNTER — Other Ambulatory Visit (HOSPITAL_BASED_OUTPATIENT_CLINIC_OR_DEPARTMENT_OTHER): Payer: Medicare Other

## 2013-08-05 ENCOUNTER — Ambulatory Visit
Admission: RE | Admit: 2013-08-05 | Discharge: 2013-08-05 | Disposition: A | Discharge status: Home / Self Care | Payer: Medicare Other | Source: Ambulatory Visit | Attending: Radiation Oncology | Admitting: Radiation Oncology

## 2013-08-05 VITALS — BP 122/55 | HR 78 | Temp 97.8°F | Resp 18 | Ht 71.0 in | Wt 151.2 lb

## 2013-08-05 DIAGNOSIS — C20 Malignant neoplasm of rectum: Secondary | ICD-10-CM

## 2013-08-05 DIAGNOSIS — K625 Hemorrhage of anus and rectum: Secondary | ICD-10-CM

## 2013-08-05 DIAGNOSIS — R197 Diarrhea, unspecified: Secondary | ICD-10-CM

## 2013-08-05 DIAGNOSIS — D649 Anemia, unspecified: Secondary | ICD-10-CM

## 2013-08-05 LAB — CBC WITH DIFFERENTIAL/PLATELET
BASO%: 0.3 % (ref 0.0–2.0)
Basophils Absolute: 0 10*3/uL (ref 0.0–0.1)
EOS ABS: 0.1 10*3/uL (ref 0.0–0.5)
EOS%: 3.1 % (ref 0.0–7.0)
HEMATOCRIT: 25.7 % — AB (ref 38.4–49.9)
HGB: 8.6 g/dL — ABNORMAL LOW (ref 13.0–17.1)
LYMPH#: 0.4 10*3/uL — AB (ref 0.9–3.3)
LYMPH%: 12.5 % — AB (ref 14.0–49.0)
MCH: 31.7 pg (ref 27.2–33.4)
MCHC: 33.3 g/dL (ref 32.0–36.0)
MCV: 95.2 fL (ref 79.3–98.0)
MONO#: 0.5 10*3/uL (ref 0.1–0.9)
MONO%: 14.3 % — ABNORMAL HIGH (ref 0.0–14.0)
NEUT%: 69.8 % (ref 39.0–75.0)
NEUTROS ABS: 2.2 10*3/uL (ref 1.5–6.5)
PLATELETS: 189 10*3/uL (ref 140–400)
RBC: 2.7 10*6/uL — AB (ref 4.20–5.82)
RDW: 17.5 % — ABNORMAL HIGH (ref 11.0–14.6)
WBC: 3.2 10*3/uL — AB (ref 4.0–10.3)

## 2013-08-05 LAB — TECHNOLOGIST REVIEW

## 2013-08-05 NOTE — Telephone Encounter (Signed)
Received fax from [facility], pharmacist spoke with pt on 4/20. Pt reported constipation. Pt was evaluated in office as scheduled today.

## 2013-08-05 NOTE — Progress Notes (Signed)
  [facility] OFFICE PROGRESS NOTE   Diagnosis:  Rectal cancer.  INTERVAL HISTORY:   He continues radiation and Xeloda. No nausea or vomiting. No mouth sores. He denies diarrhea. He has had some issues with constipation. He is now on a stool softener and drinking prune juice. He had a bowel movement this morning. He has had intermittent bleeding with bowel movements. He had no bleeding with today's bowel movement. No hand or foot pain or redness.  Objective:  Vital signs in last 24 hours:  Blood pressure 122/55, pulse 78, temperature 97.8 F (36.6 C), temperature source Oral, resp. rate 18, height $RemoveBe'5\' 11"'ocSTSGklU$  (1.803 m), weight 151 lb 3.2 oz (68.584 kg), SpO2 100.00%.    HEENT: No thrush or ulcerations. Resp: Lungs clear. Cardio: Regular cardiac rhythm. GI: Abdomen soft and nontender. No hepatomegaly. Vascular: No leg edema.  Skin: Palms nontender without erythema. Rectal: Perirectal erythema. Small area of skin breakdown at the right upper gluteal fold. Large external hemorrhoids.   Lab Results:  Lab Results  Component Value Date   WBC 3.2* 08/05/2013   HGB 8.6* 08/05/2013   HCT 25.7* 08/05/2013   MCV 95.2 08/05/2013   PLT 189 08/05/2013   NEUTROABS 2.2 08/05/2013    Imaging:  No results found.  Medications: I have reviewed the patient's current medications.  Assessment/Plan: 1. Clinical stage III (YE2VV6) adenocarcinoma of the rectum. Tumor microsatellite stable by PCR.  Initiation of neoadjuvant radiation and Xeloda 07/14/2013. 2. Ongoing tobacco use. 3. "Diarrhea" and rectal bleeding secondary to #1. 4. Multiple colon polyps on a colonoscopy 05/29/2013, tubular adenomas and a tubulovillous adenoma. 5. Family history multiple cancers. 6. Weight loss. Appetite is better. He is gaining weight. 7. Anemia likely secondary to a combination of rectal bleeding and Xeloda/radiation.   Disposition: He appears stable. He continues radiation and Xeloda.   He has  progressive anemia. He appears asymptomatic. He understands to contact the office with persistent/increased rectal bleeding. We will begin weekly labs. Signs/symptoms of anemia were reviewed with Richard [name] and his wife at today's visit.  He will return for a followup visit on 08/15/2013. He will contact the office in the interim with any problems.  Plan reviewed with Dr. Benay Spice.    Owens Shark ANP/GNP-BC   08/05/2013  12:39 PM

## 2013-08-05 NOTE — Telephone Encounter (Signed)
gv adn printed appt sched and avs foro pt for April adn May

## 2013-08-06 ENCOUNTER — Ambulatory Visit
Admission: RE | Admit: 2013-08-06 | Discharge: 2013-08-06 | Disposition: A | Discharge status: Home / Self Care | Payer: Medicare Other | Source: Ambulatory Visit | Attending: Radiation Oncology | Admitting: Radiation Oncology

## 2013-08-07 ENCOUNTER — Ambulatory Visit
Admission: RE | Admit: 2013-08-07 | Discharge: 2013-08-07 | Disposition: A | Discharge status: Home / Self Care | Payer: Medicare Other | Source: Ambulatory Visit | Attending: Radiation Oncology | Admitting: Radiation Oncology

## 2013-08-08 ENCOUNTER — Ambulatory Visit
Admission: RE | Admit: 2013-08-08 | Discharge: 2013-08-08 | Disposition: A | Discharge status: Home / Self Care | Payer: Medicare Other | Source: Ambulatory Visit | Attending: Radiation Oncology | Admitting: Radiation Oncology

## 2013-08-08 ENCOUNTER — Encounter: Payer: Self-pay | Admitting: Radiation Oncology

## 2013-08-08 VITALS — BP 136/76 | HR 75 | Temp 97.8°F | Ht 71.0 in | Wt 153.2 lb

## 2013-08-08 DIAGNOSIS — C20 Malignant neoplasm of rectum: Secondary | ICD-10-CM

## 2013-08-08 NOTE — Progress Notes (Signed)
   Department of Radiation Oncology  Phone:  [phone] Fax:        [phone]  Weekly Treatment Note    Name: Davontay Watlington Date: 08/08/2013 MRN: [id number] DOB: [birth year]   Current dose: 36 Gy  Current fraction: 20   MEDICATIONS: Current Outpatient Prescriptions  Medication Sig Dispense Refill  . capecitabine (XELODA) 500 MG tablet Take 3 tablets (= 1500mg ) in AM.  Take 3 tablets (=1500mg ) in PM.  Total of 3000 mg daily on days of Radiation Only.  168 tablet  0  . docusate sodium (COLACE) 50 MG capsule Take 1 capsule (50 mg total) by mouth 2 (two) times daily.  10 capsule  0  . Multiple Vitamin (MULTIVITAMIN) tablet Take 1 tablet by mouth daily.      . ondansetron (ZOFRAN) 8 MG tablet Take 1 tablet (8 mg total) by mouth 2 (two) times daily as needed for nausea or vomiting.  20 tablet  1  . loperamide (IMODIUM A-D) 2 MG tablet Take 2 mg by mouth 4 (four) times daily as needed for diarrhea or loose stools.       No current facility-administered medications for this encounter.     ALLERGIES: Review of patient's allergies indicates no known allergies.   LABORATORY DATA:  Lab Results  Component Value Date   WBC 3.2* 08/05/2013   HGB 8.6* 08/05/2013   HCT 25.7* 08/05/2013   MCV 95.2 08/05/2013   PLT 189 08/05/2013   Lab Results  Component Value Date   NA 141 06/19/2013   K 4.3 06/19/2013   CL 104 06/19/2013   CO2 30 06/19/2013   Lab Results  Component Value Date   ALT 8 06/19/2013   AST 13 06/19/2013   ALKPHOS 73 06/19/2013   BILITOT 0.4 06/19/2013     NARRATIVE: Mattson Dayal was seen today for weekly treatment management. The chart was checked and the patient's films were reviewed. The patient is doing well this week. He denies any fatigue. Some continued bleeding correct him but this has decreased.  PHYSICAL EXAMINATION: height is 5\' 11"  (1.803 m) and weight is 153 lb 3.2 oz (69.491 kg). His temperature is 97.8 F (36.6 C). His blood pressure is 136/76 and his pulse is  75.        ASSESSMENT: The patient is doing satisfactorily with treatment.  PLAN: We will continue with the patient's radiation treatment as planned.

## 2013-08-08 NOTE — Progress Notes (Signed)
  Radiation Oncology         [phone] ________________________________  Name: Richard [name] MRN: [id number]  Date: 08/08/2013  DOB: [birth year]  COMPLEX SIMULATION  NOTE  Diagnosis: rectal cancer  Narrative The patient has initially been planned to receive a course of radiation treatment to a dose of 45 gray in 25 fractions at 1.8 gray per fraction. The patient will now receive a boost to the high risk target volume for an additional 5.4 gray. This will be delivered in 3 fractions at 1.8 gray per fraction and a cone down boost technique will be utilized. To accomplish this, an additional 4 customized blocks have been designed for this purpose. A complex isodose plan is requested to ensure that the high-risk target region receives the appropriate radiation dose and that the nearby normal structures continue to be appropriately spared. The patient's final total dose therefore will be 50.4 gray.   ________________________________ ------------------------------------------------  Jodelle Gross, MD, PhD

## 2013-08-08 NOTE — Progress Notes (Signed)
Richard [name] has had 20 fractions to his pelvis/rectum.  He denies pain and nausea.  He has gained 2 lbs since last week and says he is eating OK.  He reports constipation and is taking colace and prune juice.  He has a small bm today.  He reports burning with urination and urinary frequency.  He reports skin irritation in his rectal area and says he "can't hardly sit down."  He says he had this before radiation started.  He has been using baby wipes and Vaseline.  Advised him not to use the Vaseline.  He denies fatigue.  He continues to take Xeloda twice a day.

## 2013-08-08 NOTE — Progress Notes (Signed)
   Department of Radiation Oncology  Phone:  [phone] Fax:        [phone]  Weekly Treatment Note    Name: Richard [name] Date: 08/08/2013 MRN: [id number] DOB: Dec 07, 1934   Current dose: 27 Gy  Current fraction: 15   MEDICATIONS: Current Outpatient Prescriptions  Medication Sig Dispense Refill  . capecitabine (XELODA) 500 MG tablet Take 3 tablets (= 1500mg ) in AM.  Take 3 tablets (=1500mg ) in PM.  Total of 3000 mg daily on days of Radiation Only.  168 tablet  0  . docusate sodium (COLACE) 50 MG capsule Take 1 capsule (50 mg total) by mouth 2 (two) times daily.  10 capsule  0  . loperamide (IMODIUM A-D) 2 MG tablet Take 2 mg by mouth 4 (four) times daily as needed for diarrhea or loose stools.      . Multiple Vitamin (MULTIVITAMIN) tablet Take 1 tablet by mouth daily.      . ondansetron (ZOFRAN) 8 MG tablet Take 1 tablet (8 mg total) by mouth 2 (two) times daily as needed for nausea or vomiting.  20 tablet  1   No current facility-administered medications for this encounter.     ALLERGIES: Review of patient's allergies indicates no known allergies.   LABORATORY DATA:  Lab Results  Component Value Date   WBC 3.2* 08/05/2013   HGB 8.6* 08/05/2013   HCT 25.7* 08/05/2013   MCV 95.2 08/05/2013   PLT 189 08/05/2013   Lab Results  Component Value Date   NA 141 06/19/2013   K 4.3 06/19/2013   CL 104 06/19/2013   CO2 30 06/19/2013   Lab Results  Component Value Date   ALT 8 06/19/2013   AST 13 06/19/2013   ALKPHOS 73 06/19/2013   BILITOT 0.4 06/19/2013     NARRATIVE: Richard [name] was seen today for weekly treatment management. The chart was checked and the patient's films were reviewed. The patient has done well this week. He voices no new complaints.  PHYSICAL EXAMINATION:  alert, in no acute distress, in good spirits  ASSESSMENT: The patient is doing satisfactorily with treatment.  PLAN: We will continue with the patient's radiation treatment as planned.

## 2013-08-11 ENCOUNTER — Ambulatory Visit
Admission: RE | Admit: 2013-08-11 | Discharge: 2013-08-11 | Disposition: A | Discharge status: Home / Self Care | Payer: Medicare Other | Source: Ambulatory Visit | Attending: Radiation Oncology | Admitting: Radiation Oncology

## 2013-08-12 ENCOUNTER — Other Ambulatory Visit (HOSPITAL_BASED_OUTPATIENT_CLINIC_OR_DEPARTMENT_OTHER): Payer: Medicare Other

## 2013-08-12 ENCOUNTER — Ambulatory Visit
Admission: RE | Admit: 2013-08-12 | Discharge: 2013-08-12 | Disposition: A | Discharge status: Home / Self Care | Payer: Medicare Other | Source: Ambulatory Visit | Attending: Radiation Oncology | Admitting: Radiation Oncology

## 2013-08-12 DIAGNOSIS — C20 Malignant neoplasm of rectum: Secondary | ICD-10-CM

## 2013-08-12 LAB — CBC WITH DIFFERENTIAL/PLATELET
BASO%: 0.2 % (ref 0.0–2.0)
BASOS ABS: 0 10*3/uL (ref 0.0–0.1)
EOS%: 2.5 % (ref 0.0–7.0)
Eosinophils Absolute: 0.1 10*3/uL (ref 0.0–0.5)
HEMATOCRIT: 23 % — AB (ref 38.4–49.9)
HEMOGLOBIN: 7.6 g/dL — AB (ref 13.0–17.1)
LYMPH%: 7.3 % — ABNORMAL LOW (ref 14.0–49.0)
MCH: 32.3 pg (ref 27.2–33.4)
MCHC: 33 g/dL (ref 32.0–36.0)
MCV: 97.8 fL (ref 79.3–98.0)
MONO#: 0.5 10*3/uL (ref 0.1–0.9)
MONO%: 10.5 % (ref 0.0–14.0)
NEUT#: 4 10*3/uL (ref 1.5–6.5)
NEUT%: 79.5 % — AB (ref 39.0–75.0)
Platelets: 165 10*3/uL (ref 140–400)
RBC: 2.36 10*6/uL — ABNORMAL LOW (ref 4.20–5.82)
RDW: 21.4 % — ABNORMAL HIGH (ref 11.0–14.6)
WBC: 5 10*3/uL (ref 4.0–10.3)
lymph#: 0.4 10*3/uL — ABNORMAL LOW (ref 0.9–3.3)

## 2013-08-13 ENCOUNTER — Ambulatory Visit
Admission: RE | Admit: 2013-08-13 | Discharge: 2013-08-13 | Disposition: A | Discharge status: Home / Self Care | Payer: Medicare Other | Source: Ambulatory Visit | Attending: Radiation Oncology | Admitting: Radiation Oncology

## 2013-08-13 ENCOUNTER — Telehealth: Payer: Self-pay | Admitting: *Deleted

## 2013-08-13 DIAGNOSIS — C20 Malignant neoplasm of rectum: Secondary | ICD-10-CM

## 2013-08-13 NOTE — Telephone Encounter (Signed)
Per staff phone call and POF I have schedueld appts. I have called and gave the wife the appts   JMW

## 2013-08-13 NOTE — Telephone Encounter (Signed)
Called patient with lab results and asked if patient was symptomatic (fatigued, sob, etc.) Patient sated that he felt "fine" he was not tired, no shortness of breath, etc.  Informed patient of possible transfusion and labs Friday 08/15/13.

## 2013-08-13 NOTE — Progress Notes (Signed)
   Department of Radiation Oncology  Phone:  [phone] Fax:        [phone]  Weekly Treatment Note    Name: Richard [name] Date: 08/13/2013 MRN: [id number] DOB: 1934/09/15   Current fraction: 23   MEDICATIONS: Current Outpatient Prescriptions  Medication Sig Dispense Refill  . capecitabine (XELODA) 500 MG tablet Take 3 tablets (= 1500mg ) in AM.  Take 3 tablets (=1500mg ) in PM.  Total of 3000 mg daily on days of Radiation Only.  168 tablet  0  . docusate sodium (COLACE) 50 MG capsule Take 1 capsule (50 mg total) by mouth 2 (two) times daily.  10 capsule  0  . loperamide (IMODIUM A-D) 2 MG tablet Take 2 mg by mouth 4 (four) times daily as needed for diarrhea or loose stools.      . Multiple Vitamin (MULTIVITAMIN) tablet Take 1 tablet by mouth daily.      . ondansetron (ZOFRAN) 8 MG tablet Take 1 tablet (8 mg total) by mouth 2 (two) times daily as needed for nausea or vomiting.  20 tablet  1   No current facility-administered medications for this encounter.     ALLERGIES: Review of patient's allergies indicates no known allergies.   LABORATORY DATA:  Lab Results  Component Value Date   WBC 5.0 08/12/2013   HGB 7.6* 08/12/2013   HCT 23.0* 08/12/2013   MCV 97.8 08/12/2013   PLT 165 08/12/2013   Lab Results  Component Value Date   NA 141 06/19/2013   K 4.3 06/19/2013   CL 104 06/19/2013   CO2 30 06/19/2013   Lab Results  Component Value Date   ALT 8 06/19/2013   AST 13 06/19/2013   ALKPHOS 73 06/19/2013   BILITOT 0.4 06/19/2013     NARRATIVE: Richard [name] was seen today for weekly treatment management. The chart was checked and the patient's films were reviewed. The patient complains of an area of irritation posteriorly in the upper buttock region. He asked to be seen today for evaluation for this.  PHYSICAL EXAMINATION: vitals were not taken for this visit.     an area of moist desquamation/skin breakdown is present as he described. Hyperpigmentation more broadly in the  pelvic/treatment area.  ASSESSMENT: The patient is doing satisfactorily with treatment.  He is having some increased skin irritation with some associated discomfort.  PLAN: We will continue with the patient's radiation treatment as planned. I recommended for the patient to use sitz baths as well as some Neosporin with pain. He also can take ibuprofen when necessary. He is to let us know if this is not sufficient.

## 2013-08-13 NOTE — Telephone Encounter (Signed)
Message copied by Norma Fredrickson on Wed Aug 13, 2013 10:27 AM ------      Message from: [place], Thermal K      Created: Wed Aug 13, 2013  9:50 AM       Please let him know hemoglobin is lower. See if he is symptomatic. If so, arrange for transfusion today or tomorrow.  If he is not symptomatic we can get a CBC and type/hold on Friday, 08/15/2013, possible transfusion. Thanks.       ----- Message -----         From: [facility]         Sent: 08/12/2013   3:37 PM           To: Owens Shark, NP                   ------

## 2013-08-14 ENCOUNTER — Ambulatory Visit
Admission: RE | Admit: 2013-08-14 | Discharge: 2013-08-14 | Disposition: A | Discharge status: Home / Self Care | Payer: Medicare Other | Source: Ambulatory Visit | Attending: Radiation Oncology | Admitting: Radiation Oncology

## 2013-08-14 ENCOUNTER — Telehealth: Payer: Self-pay | Admitting: *Deleted

## 2013-08-14 NOTE — Telephone Encounter (Signed)
Called patient home, he did get the neosporin with p[ain relief and it has helped, hasn't used the sitz bath as yet but will if needed stated, informed him not to apply neosporin to affected area  4 hours prior to radiation treatments,can apply right after treatment and prn ,teach back given 11:00 AM

## 2013-08-15 ENCOUNTER — Ambulatory Visit
Admission: RE | Admit: 2013-08-15 | Discharge: 2013-08-15 | Disposition: A | Discharge status: Home / Self Care | Payer: Medicare Other | Source: Ambulatory Visit | Attending: Radiation Oncology | Admitting: Radiation Oncology

## 2013-08-15 ENCOUNTER — Other Ambulatory Visit (HOSPITAL_BASED_OUTPATIENT_CLINIC_OR_DEPARTMENT_OTHER): Payer: Medicare Other

## 2013-08-15 ENCOUNTER — Ambulatory Visit (HOSPITAL_COMMUNITY)
Admission: RE | Admit: 2013-08-15 | Discharge: 2013-08-15 | Disposition: A | Discharge status: Home / Self Care | Payer: Medicare Other | Source: Ambulatory Visit | Attending: Oncology | Admitting: Oncology

## 2013-08-15 ENCOUNTER — Ambulatory Visit (HOSPITAL_BASED_OUTPATIENT_CLINIC_OR_DEPARTMENT_OTHER): Payer: Medicare Other | Admitting: Oncology

## 2013-08-15 ENCOUNTER — Ambulatory Visit (HOSPITAL_BASED_OUTPATIENT_CLINIC_OR_DEPARTMENT_OTHER): Payer: Medicare Other

## 2013-08-15 VITALS — BP 112/55 | HR 69 | Temp 98.7°F | Resp 16

## 2013-08-15 DIAGNOSIS — D649 Anemia, unspecified: Secondary | ICD-10-CM

## 2013-08-15 DIAGNOSIS — R197 Diarrhea, unspecified: Secondary | ICD-10-CM

## 2013-08-15 DIAGNOSIS — R634 Abnormal weight loss: Secondary | ICD-10-CM

## 2013-08-15 DIAGNOSIS — C20 Malignant neoplasm of rectum: Secondary | ICD-10-CM

## 2013-08-15 LAB — CBC WITH DIFFERENTIAL/PLATELET
BASO%: 0.3 % (ref 0.0–2.0)
BASOS ABS: 0 10*3/uL (ref 0.0–0.1)
EOS%: 1.2 % (ref 0.0–7.0)
Eosinophils Absolute: 0 10*3/uL (ref 0.0–0.5)
HEMATOCRIT: 22 % — AB (ref 38.4–49.9)
HEMOGLOBIN: 7.2 g/dL — AB (ref 13.0–17.1)
LYMPH%: 8.8 % — AB (ref 14.0–49.0)
MCH: 31.4 pg (ref 27.2–33.4)
MCHC: 32.7 g/dL (ref 32.0–36.0)
MCV: 96.1 fL (ref 79.3–98.0)
MONO#: 0.6 10*3/uL (ref 0.1–0.9)
MONO%: 16.8 % — ABNORMAL HIGH (ref 0.0–14.0)
NEUT%: 72.9 % (ref 39.0–75.0)
NEUTROS ABS: 2.4 10*3/uL (ref 1.5–6.5)
PLATELETS: 160 10*3/uL (ref 140–400)
RBC: 2.29 10*6/uL — ABNORMAL LOW (ref 4.20–5.82)
RDW: 20.4 % — AB (ref 11.0–14.6)
WBC: 3.3 10*3/uL — ABNORMAL LOW (ref 4.0–10.3)
lymph#: 0.3 10*3/uL — ABNORMAL LOW (ref 0.9–3.3)
nRBC: 0 % (ref 0–0)

## 2013-08-15 LAB — PREPARE RBC (CROSSMATCH)

## 2013-08-15 LAB — HOLD TUBE, BLOOD BANK

## 2013-08-15 LAB — ABO/RH: ABO/RH(D): A NEG

## 2013-08-15 MED ORDER — SODIUM CHLORIDE 0.9 % IV SOLN
250.0000 mL | Freq: Once | INTRAVENOUS | Status: AC
  Administered 2013-08-15: 250 mL via INTRAVENOUS

## 2013-08-15 NOTE — Progress Notes (Addendum)
  [facility] OFFICE PROGRESS NOTE   Diagnosis:  Rectal cancer.  INTERVAL HISTORY:   He continues radiation and Xeloda. He denies nausea/vomiting. No mouth sores. No diarrhea. He denies hand or foot pain or redness. He continues to have pain and bleeding with bowel movements. He thinks the bleeding has lessened since beginning radiation. He denies shortness of breath. No chest pain.  Objective:  Vital signs in last 24 hours:  There were no vitals taken for this visit.    HEENT: No thrush or ulcerations. Resp: Lungs clear. Cardio: Regular cardiac rhythm. GI: Abdomen soft and nontender. No hepatomegaly. External hemorrhoids Vascular: No leg edema.  Skin: Palms without erythema. Hyperpigmentation at the perineum. Superficial breakdown at the lower gluteal fold.   Lab Results:  Lab Results  Component Value Date   WBC 3.3* 08/15/2013   HGB 7.2* 08/15/2013   HCT 22.0* 08/15/2013   MCV 96.1 08/15/2013   PLT 160 08/15/2013   NEUTROABS 2.4 08/15/2013    Medications: I have reviewed the patient's current medications.  Assessment/Plan: 1. 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 Tumor microsatellite stable by PCR.  Initiation of neoadjuvant radiation and Xeloda 07/14/2013. 2. Ongoing tobacco use. 3. "Diarrhea" and rectal bleeding secondary to #1. 4. Multiple colon polyps on a colonoscopy 05/29/2013, tubular adenomas and a tubulovillous adenoma. 5. Family history multiple cancers. 6. Weight loss. Appetite is better.  7. Anemia likely secondary to a combination of rectal bleeding and Xeloda/radiation.   Disposition: He appears stable. He will complete the course of radiation on 08/20/2013. He understands to discontinue Xeloda coinciding with the completion of radiation. We are making a referral back to Dr. Johney Maine for surgery planning.  He has progressive anemia and is currently receiving a blood transfusion. We will obtain a followup CBC on 08/20/2013.  He  will return for a followup visit in 2-3 weeks.  Patient seen with Dr. Benay Spice.    Owens Shark ANP/GNP-BC   08/15/2013  4:41 PM  This was a shared visit with Ned Card. Mr. Cloretta Ned was interviewed and examined. The anemia is likely secondary to rectal bleeding and chemotherapy/radiation. He will receive a Red cell transfusion today.  He has superficial skin breakdown at the upper perineum/gluteal fold. He will apply topical therapy as recommended by radiation oncology.  Julieanne Manson, M.D.

## 2013-08-15 NOTE — Patient Instructions (Signed)
Blood Transfusion Information WHAT IS A BLOOD TRANSFUSION? A transfusion is the replacement of blood or some of its parts. Blood is made up of multiple cells which provide different functions.  Red blood cells carry oxygen and are used for blood loss replacement.  White blood cells fight against infection.  Platelets control bleeding.  Plasma helps clot blood.  Other blood products are available for specialized needs, such as hemophilia or other clotting disorders. BEFORE THE TRANSFUSION  Who gives blood for transfusions?   You may be able to donate blood to be used at a later date on yourself (autologous donation).  Relatives can be asked to donate blood. This is generally not any safer than if you have received blood from a stranger. The same precautions are taken to ensure safety when a relative's blood is donated.  Healthy volunteers who are fully evaluated to make sure their blood is safe. This is blood bank blood. Transfusion therapy is the safest it has ever been in the practice of medicine. Before blood is taken from a donor, a complete history is taken to make sure that person has no history of diseases nor engages in risky social behavior (examples are intravenous drug use or sexual activity with multiple partners). The donor's travel history is screened to minimize risk of transmitting infections, such as malaria. The donated blood is tested for signs of infectious diseases, such as HIV and hepatitis. The blood is then tested to be sure it is compatible with you in order to minimize the chance of a transfusion reaction. If you or a relative donates blood, this is often done in anticipation of surgery and is not appropriate for emergency situations. It takes many days to process the donated blood. RISKS AND COMPLICATIONS Although transfusion therapy is very safe and saves many lives, the main dangers of transfusion include:   Getting an infectious disease.  Developing a  transfusion reaction. This is an allergic reaction to something in the blood you were given. Every precaution is taken to prevent this. The decision to have a blood transfusion has been considered carefully by your caregiver before blood is given. Blood is not given unless the benefits outweigh the risks. AFTER THE TRANSFUSION  Right after receiving a blood transfusion, you will usually feel much better and more energetic. This is especially true if your red blood cells have gotten low (anemic). The transfusion raises the level of the red blood cells which carry oxygen, and this usually causes an energy increase.  The nurse administering the transfusion will monitor you carefully for complications. HOME CARE INSTRUCTIONS  No special instructions are needed after a transfusion. You may find your energy is better. Speak with your caregiver about any limitations on activity for underlying diseases you may have. SEEK MEDICAL CARE IF:   Your condition is not improving after your transfusion.  You develop redness or irritation at the intravenous (IV) site. SEEK IMMEDIATE MEDICAL CARE IF:  Any of the following symptoms occur over the next 12 hours:  Shaking chills.  You have a temperature by mouth above 102 F (38.9 C), not controlled by medicine.  Chest, back, or muscle pain.  People around you feel you are not acting correctly or are confused.  Shortness of breath or difficulty breathing.  Dizziness and fainting.  You get a rash or develop hives.  You have a decrease in urine output.  Your urine turns a dark color or changes to pink, red, or brown. Any of the following   symptoms occur over the next 10 days:  You have a temperature by mouth above 102 F (38.9 C), not controlled by medicine.  Shortness of breath.  Weakness after normal activity.  The white part of the eye turns yellow (jaundice).  You have a decrease in the amount of urine or are urinating less often.  Your  urine turns a dark color or changes to pink, red, or brown. Document Released: 03/31/2000 Document Revised: 06/26/2011 Document Reviewed: 11/18/2007 [employer] Patient Information 2014 [employer].  

## 2013-08-15 NOTE — Progress Notes (Signed)
  [facility] OFFICE PROGRESS NOTE See note dictated by Ned Card 08/15/2013 Ladell Pier, MD  08/15/2013  2:23 PM

## 2013-08-15 NOTE — Progress Notes (Signed)
Patient seen on 08/13/13 by Dr.moody is in block of 5 , patient went back to medical Oncolgy to get transfued with PRBC today 1:37 PM

## 2013-08-16 LAB — TYPE AND SCREEN
ABO/RH(D): A NEG
Antibody Screen: NEGATIVE
UNIT DIVISION: 0
Unit division: 0

## 2013-08-18 ENCOUNTER — Ambulatory Visit
Admission: RE | Admit: 2013-08-18 | Discharge: 2013-08-18 | Disposition: A | Discharge status: Home / Self Care | Payer: Medicare Other | Source: Ambulatory Visit | Attending: Radiation Oncology | Admitting: Radiation Oncology

## 2013-08-19 ENCOUNTER — Other Ambulatory Visit: Payer: Medicare Other

## 2013-08-19 ENCOUNTER — Telehealth: Payer: Self-pay | Admitting: *Deleted

## 2013-08-19 ENCOUNTER — Ambulatory Visit (HOSPITAL_BASED_OUTPATIENT_CLINIC_OR_DEPARTMENT_OTHER): Payer: Medicare Other

## 2013-08-19 ENCOUNTER — Ambulatory Visit
Admission: RE | Admit: 2013-08-19 | Discharge: 2013-08-19 | Disposition: A | Discharge status: Home / Self Care | Payer: Medicare Other | Source: Ambulatory Visit | Attending: Radiation Oncology | Admitting: Radiation Oncology

## 2013-08-19 DIAGNOSIS — C20 Malignant neoplasm of rectum: Secondary | ICD-10-CM

## 2013-08-19 LAB — CBC WITH DIFFERENTIAL/PLATELET
BASO%: 0.1 % (ref 0.0–2.0)
Basophils Absolute: 0 10*3/uL (ref 0.0–0.1)
EOS%: 1 % (ref 0.0–7.0)
Eosinophils Absolute: 0.1 10*3/uL (ref 0.0–0.5)
HCT: 24.5 % — ABNORMAL LOW (ref 38.4–49.9)
HGB: 8.1 g/dL — ABNORMAL LOW (ref 13.0–17.1)
LYMPH%: 5.2 % — AB (ref 14.0–49.0)
MCH: 31.8 pg (ref 27.2–33.4)
MCHC: 33.2 g/dL (ref 32.0–36.0)
MCV: 95.8 fL (ref 79.3–98.0)
MONO#: 0.7 10*3/uL (ref 0.1–0.9)
MONO%: 11.5 % (ref 0.0–14.0)
NEUT#: 4.7 10*3/uL (ref 1.5–6.5)
NEUT%: 82.2 % — ABNORMAL HIGH (ref 39.0–75.0)
PLATELETS: 149 10*3/uL (ref 140–400)
RBC: 2.55 10*6/uL — AB (ref 4.20–5.82)
RDW: 18.7 % — AB (ref 11.0–14.6)
WBC: 5.8 10*3/uL (ref 4.0–10.3)
lymph#: 0.3 10*3/uL — ABNORMAL LOW (ref 0.9–3.3)

## 2013-08-19 NOTE — Telephone Encounter (Signed)
Question regarding treatment appointment today at 1:30 and lab later in the afternoon at 4pm-can this be changed? Also asking if Dr. Benay Spice will see them and discuss the xray results? Made wife aware that Dr. Benay Spice has not ordered any xrays on him-follow up with Dr. Lisbeth Renshaw about this. May have had port films? Noted that the lab appointment had already been moved as requested. Confirmed with wife that the treatment he is getting today is for radiation and not chemo.

## 2013-08-20 ENCOUNTER — Ambulatory Visit
Admission: RE | Admit: 2013-08-20 | Discharge: 2013-08-20 | Disposition: A | Discharge status: Home / Self Care | Payer: Medicare Other | Source: Ambulatory Visit | Attending: Radiation Oncology | Admitting: Radiation Oncology

## 2013-08-20 ENCOUNTER — Encounter: Payer: Self-pay | Admitting: Radiation Oncology

## 2013-08-20 VITALS — BP 119/58 | HR 67 | Temp 97.7°F | Resp 20 | Wt 152.8 lb

## 2013-08-20 DIAGNOSIS — C20 Malignant neoplasm of rectum: Secondary | ICD-10-CM

## 2013-08-20 NOTE — Progress Notes (Addendum)
Weekly rad txs rectal  28/28 completd, still has sore on bottom, but better,using neosporin prn, soft stools, no nausea, appetite getting good, had PRBC last Friday, CBC 08/19/13, hgb=8.1  Mild fatigue, will stop Xeloda after today 11:48 AM

## 2013-08-20 NOTE — Progress Notes (Signed)
   Department of Radiation Oncology  Phone:  [phone] Fax:        [phone]  Weekly Treatment Note    Name: Richard [name] Date: 08/20/2013 MRN: [id number] DOB: 02/14/1935   Current dose: 50.4 Gy  Current fraction: 28   MEDICATIONS: Current Outpatient Prescriptions  Medication Sig Dispense Refill  . capecitabine (XELODA) 500 MG tablet Take 3 tablets (= 1500mg ) in AM.  Take 3 tablets (=1500mg ) in PM.  Total of 3000 mg daily on days of Radiation Only.  168 tablet  0  . docusate sodium (COLACE) 50 MG capsule Take 1 capsule (50 mg total) by mouth 2 (two) times daily.  10 capsule  0  . loperamide (IMODIUM A-D) 2 MG tablet Take 2 mg by mouth 4 (four) times daily as needed for diarrhea or loose stools.      . ondansetron (ZOFRAN) 8 MG tablet Take 1 tablet (8 mg total) by mouth 2 (two) times daily as needed for nausea or vomiting.  20 tablet  1  . Multiple Vitamin (MULTIVITAMIN) tablet Take 1 tablet by mouth daily.       No current facility-administered medications for this encounter.     ALLERGIES: Review of patient's allergies indicates no known allergies.   LABORATORY DATA:  Lab Results  Component Value Date   WBC 5.8 08/19/2013   HGB 8.1* 08/19/2013   HCT 24.5* 08/19/2013   MCV 95.8 08/19/2013   PLT 149 08/19/2013   Lab Results  Component Value Date   NA 141 06/19/2013   K 4.3 06/19/2013   CL 104 06/19/2013   CO2 30 06/19/2013   Lab Results  Component Value Date   ALT 8 06/19/2013   AST 13 06/19/2013   ALKPHOS 73 06/19/2013   BILITOT 0.4 06/19/2013     NARRATIVE: Salvador Coupe was seen today for weekly treatment management. The chart was checked and the patient's films were reviewed. The patient finished his final fraction today. He did satisfactorily. The area of irritation in his upper buttock region he states has improved a little. He is using Neosporin with pain relief. No new complaints.  PHYSICAL EXAMINATION: weight is 152 lb 12.8 oz (69.31 kg). His oral temperature is 97.7  F (36.5 C). His blood pressure is 119/58 and his pulse is 67. His respiration is 20.      desquamation still is present in the upper buttock region. No major change. Some moistness although this may be has improved slightly. External hemorrhoids present. Hyperpigmentation over a broader treatment area  ASSESSMENT: The patient is doing satisfactorily with treatment.  PLAN: The patient will continue his current regimen. I discussed with him possible additional options to speed healing process but he feels it is going well at this time without any substantial changes. This should improve significantly over the next couple of weeks and I will have the patient return to clinic in one month for routine followup.

## 2013-08-22 ENCOUNTER — Telehealth: Payer: Self-pay | Admitting: Oncology

## 2013-08-22 NOTE — Telephone Encounter (Signed)
Talked to pt and he is aware of appt on 5/22

## 2013-08-26 NOTE — Progress Notes (Signed)
  Radiation Oncology         [phone] ________________________________  Name: Richard [name] MRN: [id number]  Date: 08/20/2013  DOB: 12-Aug-1934  End of Treatment Note  Diagnosis:   Rectal cancer     Indication for treatment:  Curative       Radiation treatment dates:   07/14/2013 through 08/20/2013  Site/dose:   The patient was treated using a 4 field technique to the pelvis initially to a dose of 45 gray. The patient then received a 4 field boost for an additional 5.4 gray. The patient's final total dose was 50.4 gray.  Narrative: The patient tolerated radiation treatment relatively well.   The patient experienced some skin irritation during treatment as well as some GI issues including loose stools.  Plan: The patient has completed radiation treatment. The patient will return to radiation oncology clinic for routine followup in one month. I advised the patient to call or return sooner if they have any questions or concerns related to their recovery or treatment. ________________________________  Jodelle Gross, M.D., Ph.D.

## 2013-09-03 ENCOUNTER — Encounter (INDEPENDENT_AMBULATORY_CARE_PROVIDER_SITE_OTHER): Payer: Self-pay | Admitting: Surgery

## 2013-09-03 ENCOUNTER — Ambulatory Visit (INDEPENDENT_AMBULATORY_CARE_PROVIDER_SITE_OTHER): Payer: Medicare Other | Admitting: Surgery

## 2013-09-03 VITALS — BP 114/70 | HR 68 | Temp 97.3°F | Ht 71.0 in | Wt 152.0 lb

## 2013-09-03 DIAGNOSIS — C20 Malignant neoplasm of rectum: Secondary | ICD-10-CM

## 2013-09-03 DIAGNOSIS — Z720 Tobacco use: Secondary | ICD-10-CM

## 2013-09-03 DIAGNOSIS — F17200 Nicotine dependence, unspecified, uncomplicated: Secondary | ICD-10-CM

## 2013-09-03 MED ORDER — NEOMYCIN SULFATE 500 MG PO TABS: 1000.0000 mg | ORAL_TABLET | ORAL | Status: DC

## 2013-09-03 MED ORDER — METRONIDAZOLE 500 MG PO TABS: 500.0000 mg | ORAL_TABLET | ORAL | Status: DC

## 2013-09-03 NOTE — Patient Instructions (Signed)
Please consider the recommendations that we have given you today:  Plan minimum invasive surgery to remove rectal cancer 8-10 weeks from completion of chemotherapy (= mid/late July).  You will most likely need a temporary ileostomy to divert stool away from the anastomosis the first 3-12 months and minimize the chance of a leak or permanent ostomy.  Stop smoking.  See the Handout(s) we have given you.  Please call our office at [phone] if you wish to schedule surgery or if you have further questions / concerns.   STOP SMOKING!  We strongly recommend that you stop smoking.  Smoking increases the risk of surgery including infection in the form of an open wound, pus formation, abscess, hernia at an incision on the abdomen, etc.  You have an increased risk of other MAJOR complications such as stroke, heart attack, forming clots in the leg and/or lungs, and death.    Smoking Cessation Quitting smoking is important to your health and has many advantages. However, it is not always easy to quit since nicotine is a very addictive drug. Often times, people try 3 times or more before being able to quit. This document explains the best ways for you to prepare to quit smoking. Quitting takes hard work and a lot of effort, but you can do it. ADVANTAGES OF QUITTING SMOKING  You will live longer, feel better, and live better.  Your body will feel the impact of quitting smoking almost immediately.  Within 20 minutes, blood pressure decreases. Your pulse returns to its normal level.  After 8 hours, carbon monoxide levels in the blood return to normal. Your oxygen level increases.  After 24 hours, the chance of having a heart attack starts to decrease. Your breath, hair, and body stop smelling like smoke.  After 48 hours, damaged nerve endings begin to recover. Your sense of taste and smell improve.  After 72 hours, the body is virtually free of nicotine. Your bronchial tubes relax and breathing  becomes easier.  After 2 to 12 weeks, lungs can hold more air. Exercise becomes easier and circulation improves.  The risk of having a heart attack, stroke, cancer, or lung disease is greatly reduced.  After 1 year, the risk of coronary heart disease is cut in half.  After 5 years, the risk of stroke falls to the same as a nonsmoker.  After 10 years, the risk of lung cancer is cut in half and the risk of other cancers decreases significantly.  After 15 years, the risk of coronary heart disease drops, usually to the level of a nonsmoker.  If you are pregnant, quitting smoking will improve your chances of having a healthy baby.  The people you live with, especially any children, will be healthier.  You will have extra money to spend on things other than cigarettes. QUESTIONS TO THINK ABOUT BEFORE ATTEMPTING TO QUIT You may want to talk about your answers with your caregiver.  Why do you want to quit?  If you tried to quit in the past, what helped and what did not?  What will be the most difficult situations for you after you quit? How will you plan to handle them?  Who can help you through the tough times? Your family? Friends? A caregiver?  What pleasures do you get from smoking? What ways can you still get pleasure if you quit? Here are some questions to ask your caregiver:  How can you help me to be successful at quitting?  What medicine do  you think would be best for me and how should I take it?  What should I do if I need more help?  What is smoking withdrawal like? How can I get information on withdrawal? GET READY  Set a quit date.  Change your environment by getting rid of all cigarettes, ashtrays, matches, and lighters in your home, car, or work. Do not let people smoke in your home.  Review your past attempts to quit. Think about what worked and what did not. GET SUPPORT AND ENCOURAGEMENT You have a better chance of being successful if you have help. You can  get support in many ways.  Tell your family, friends, and co-workers that you are going to quit and need their support. Ask them not to smoke around you.  Get individual, group, or telephone counseling and support. Programs are available at [employer] and health centers. Call your local health department for information about programs in your area.  Spiritual beliefs and practices may help some smokers quit.  Download a "quit meter" on your computer to keep track of quit statistics, such as how long you have gone without smoking, cigarettes not smoked, and money saved.  Get a self-help book about quitting smoking and staying off of tobacco. [place] yourself from urges to smoke. Talk to someone, go for a walk, or occupy your time with a task.  Change your normal routine. Take a different route to work. Drink tea instead of coffee. Eat breakfast in a different place.  Reduce your stress. Take a hot bath, exercise, or read a book.  Plan something enjoyable to do every day. Reward yourself for not smoking.  Explore interactive web-based programs that specialize in helping you quit. GET MEDICINE AND USE IT CORRECTLY Medicines can help you stop smoking and decrease the urge to smoke. Combining medicine with the above behavioral methods and support can greatly increase your chances of successfully quitting smoking.  Nicotine replacement therapy helps deliver nicotine to your body without the negative effects and risks of smoking. Nicotine replacement therapy includes nicotine gum, lozenges, inhalers, nasal sprays, and skin patches. Some may be available over-the-counter and others require a prescription.  Antidepressant medicine helps people abstain from smoking, but how this works is unknown. This medicine is available by prescription.  Nicotinic receptor partial agonist medicine simulates the effect of nicotine in your brain. This medicine is available by  prescription. Ask your caregiver for advice about which medicines to use and how to use them based on your health history. Your caregiver will tell you what side effects to look out for if you choose to be on a medicine or therapy. Carefully read the information on the package. Do not use any other product containing nicotine while using a nicotine replacement product.  RELAPSE OR DIFFICULT SITUATIONS Most relapses occur within the first 3 months after quitting. Do not be discouraged if you start smoking again. Remember, most people try several times before finally quitting. You may have symptoms of withdrawal because your body is used to nicotine. You may crave cigarettes, be irritable, feel very hungry, cough often, get headaches, or have difficulty concentrating. The withdrawal symptoms are only temporary. They are strongest when you first quit, but they will go away within 10 14 days. To reduce the chances of relapse, try to:  Avoid drinking alcohol. Drinking lowers your chances of successfully quitting.  Reduce the amount of caffeine you consume. Once you quit smoking, the amount  of caffeine in your body increases and can give you symptoms, such as a rapid heartbeat, sweating, and anxiety.  Avoid smokers because they can make you want to smoke.  Do not let weight gain distract you. Many smokers will gain weight when they quit, usually less than 10 pounds. Eat a healthy diet and stay active. You can always lose the weight gained after you quit.  Find ways to improve your mood other than smoking. FOR MORE INFORMATION  www.smokefree.gov    While it can be one of the most difficult things to do, the [place] community has programs to help you stop.  Consider talking with your primary care physician about options.  Also, Smoking Cessation classes are available through the [facility]:  The smoking cessation program is a proven-effective program from the American Lung Association. The program is  available for anyone 47 and older who currently smokes. The program lasts for 7 weeks and is 8 sessions. Each class will be approximately 1 1/2 hours. The program is every Tuesday.  All classes are 12-1:30pm and same location.  Event Location Information:  Location: [place] 2nd Floor Conference Room 2-037; located next to [facility] cross streets: [place] Entrance into the [facility] is adjacent to the [employer] main entrance. The conference room is located on the 2nd floor.  Parking Instructions: Visitor parking is adjacent to [employer] main entrance and the [place]    A smoking cessation program is also offered through the [facility]. Register online at ClickDebate.gl or call [phone] for more information.   Tobacco cessation counseling is available at [facility]. Call [phone] for a free appointment.   Tobacco cessation classes also are available through the [place] in [place]. For information, call [phone].   The Patient Education Network features videos on tobacco cessation. Please consult your listings in the center of this book to find instructions on how to access this resource.   If you want more information, ask your nurse.     Colorectal Cancer Colorectal cancer is an abnormal growth of tissue (tumor) in the colon or rectum that is cancerous (malignant). Unlike noncancerous (benign) tumors, malignant tumors can spread to other parts of your body. The colon is the large bowel or large intestine. The rectum is the last several inches of the colon.  RISK FACTORS The exact cause of colorectal cancer is unknown. However, the following factors may increase your chances of getting colorectal cancer:   Age older than 55 years.   Abnormal growths (polyps) on the inner wall of the colon or rectum.    Diabetes.   African American race.   Family history of hereditary nonpolyposis colorectal cancer. This condition is caused by changes in the genes that are responsible for repairing mismatched DNA.   Personal history of cancer. A person who has already had colorectal cancer may develop it a second time. Also, women with a history of ovarian, uterine, or breast cancer are at a somewhat higher risk of developing colorectal cancer.  Certain hereditary conditions.  Eating a diet that is high in fat (especially animal fat) and low in fiber, fruits, and vegetables.  Sedentary lifestyle.  Inflammatory bowel disease, including ulcerative colitis and Crohn disease.   Smoking.   Excessive alcohol use.  SYMPTOMS Early colorectal cancer often does not cause symptoms. As the cancer grows, symptoms may  include:   Changes in bowel habits.  Diarrhea.   Constipation.   Feeling like the bowel does not empty completely after a bowel movement.   Blood in the stool.   Stools that are narrower than usual.   Abdominal discomfort, pain, bloating, fullness, or cramps.  Frequent gas pain.   Unexplained weight loss.   Constant tiredness.   Nausea and vomiting.  DIAGNOSIS  Your health care provider will ask about your medical history. He or she may also perform a number of procedures, such as:   A physical exam.  A digital rectal exam.  A fecal occult blood test.  A barium enema.  Blood tests.   X-rays.   Imaging tests, such as CT scans or MRIs.   Taking a tissue sample (biopsy) from your colon or rectum to look for cancer cells.   A sigmoidoscopy to view the inside of the last part of your colon.   A colonoscopy to view the inside of your entire colon.   An endorectal ultrasound to see how deep a rectal tumor has grown and whether the cancer has spread to lymph nodes or other nearby tissues.  Your cancer will be staged to determine its severity and  extent. Staging is a careful attempt to find out the size of the tumor, whether the cancer has spread, and if so, to what parts of the body. You may need to have more tests to determine the stage of your cancer. The test results will help determine what treatment plan is best for you.   Stage 0 The cancer is found only in the innermost lining of the colon or rectum.   Stage I The cancer has grown into the inner wall of the colon or rectum. The cancer has not yet reached the outer wall of the colon.   Stage II The cancer extends more deeply into or through the wall of the colon or rectum. It may have invaded nearby tissue, but cancer cells have not spread to the lymph nodes.   Stage III The cancer has spread to nearby lymph nodes but not to other parts of the body.   Stage IV The cancer has spread to other parts of the body, such as the liver or lungs.  Your health care provider may tell you the detailed stage of your cancer, which includes both a number and a letter.  TREATMENT  Depending on the type and stage, colorectal cancer may be treated with surgery, radiation therapy, chemotherapy, targeted therapy, or radiofrequency ablation. Some people have a combination of these therapies. Surgery may be done to remove the polyps from your colon. In early stages, your health care provider may be able to do this during a colonoscopy. In later stages, surgery may be done to remove part of your colon.  HOME CARE INSTRUCTIONS   Only take over-the-counter or prescription medicines for pain, discomfort, or fever as directed by your health care provider.   Maintain a healthy diet.   Consider joining a support group. This may help you learn to cope with the stress of having colorectal cancer.   Seek advice to help you manage treatment of side effects.   Keep all follow-up appointments as directed by your health care provider.   Inform your cancer specialist if you are admitted to the  hospital.  SEEK MEDICAL CARE IF:  Your diarrhea or constipation does not go away.   Your bowel habits change.  You have increased abdominal pain.  You notice new fatigue or weakness.  You lose weight. Document Released: 04/03/2005 Document Revised: 12/04/2012 Document Reviewed: 09/26/2012 [facility] Patient Information 2014 [place].  Ostomy Support Information  Yes, you've heard that people get along just fine with only one of their eyes, or one of their lungs, or one of their kidneys. But you also know that you have only one intestine and only one bladder, and that leaves you feeling awfully empty, both physically and emotionally: You think no other people go around without part of their intestine with the ends of their intestines sticking out through their abdominal walls.  Well, you are wrong! There are nearly three quarters of a million people in the [place] who have an ostomy; people who have had surgery to remove all or part of their colons or bladders. There is even a national association, the [place] [employer] [place] with over 350 local affiliated support groups that are organized by volunteers who provide peer support and counseling. Richard [name] has a toll free telephone num-ber, [phone] and an educational,  interactive website, www.ostomy.org   An ostomy is an opening in the belly (abdominal wall) made by surgery. Ostomates are people who have had this procedure. The opening (stoma) allows the kidney or bowel to discharge waste. An external pouch covers the stoma to collect waste. Pouches are are a simple bag and are odor free. Different companies have disposable or reusable pouches to fit one's lifestyle. An ostomy can either be temporary or permanent.  THERE ARE THREE MAIN TYPES OF OSTOMIES  Colostomy. A colostomy is a surgically created opening in the large intestine (colon).  Ileostomy. An ileostomy is a surgically created opening in the small  intestine.  Urostomy. A urostomy is a surgically created opening to divert urine away from the bladder. FREQUENTLY ASKED QUESTIONS   Why haven't you met any of these folks who have an ostomy?  Well, maybe you have! You just did not recognize them because an ostomy doesn't show. It can be kept secret if you wish. Why, maybe some of your best friends, office associates or neighbors have an ostomy ... you never can tell.   People facing ostomy surgery have many quality-of-life questions like:  Will you bulge? Smell? Make noises? Will you feel waste leaving your body? Will you be a captive of the toilet? Will you starve? Be a social outcast? Get/stay married? Have babies? Easily bathe, go swimming, bend over?  OK, let's look at what you can expect:  Will you bulge?  Remember, without part of the intestine or bladder, and its contents, you should have a flatter tummy than before. You can expect to wear, with little exception, what you wore before surgery ... and this in-cludes tight clothing and bathing suits.  Will you smell?  Today, thanks to modern odor proof pouching systems, you can walk into an ostomy support group meeting and not smell anything that is foul or offensive. And, for those with an ileostomy or colostomy who are concerned about odor when emptying their pouch, there are in-pouch deodorants that can be used to eliminate any waste odors that may exist.  Will you make noises?  Everyone produces gas, especially if they are an air-swallower. But intestinal sounds that occur from time to time are no differ-ent than a gurgling tummy, and quite often your clothing will muffle any sounds.   Will you feel the waste discharges?  For those with a colostomy or ileostomy there might be a slight pressure  when waste leaves your body, but understand that the intestines have no nerve endings, so there will be no unpleasant sensations. Those with a urostomy will probably be unaware of any kidney  drainage.  Will you be a captive of the toilet?  Immediately post-op you will spend more time in the bathroom than you will after your body recovers from surgery. Every person is different, but on average those with an ileostomy or urostomy may empty their pouches 4 to 6 times a day; a little  less if you have a colostomy. The average wear time between pouch system changes is 3 to 5 days and the changing process should take less than 30 minutes.  Will I need to be on a special diet? Most people return to their normal diet when they have recovered from surgery. Be sure to chew your food well, eat a well-balanced diet and drink plenty of fluids. If you experience problems with a certain food, wait a couple of weeks and try it again. Will there be odor and noises? Pouching systems are designed to be odor-proof or odor-resistant. There are deodorants that can be used in the pouch. Medications are also available to help reduce odor. Limit gas-producing foods and carbonated beverages. You will experience less gas and fewer noises as you heal from surgery. How much time will it take to care for my ostomy? At first, you may spend a lot of time learning about your ostomy and how to take care of it. As you become more comfortable and skilled at changing the pouching system, it will take very little time to care for it.  Will I be able to return to work? People with ostomies can perform most jobs. As soon as you have healed from surgery, you should be able to return to work. Heavy lifting (more than 10 pounds) may be discouraged.  What about intimacy? Sexual relationships and intimacy are important and fulfilling aspects of your life. They should continue after ostomy surgery. Intimacy-related concerns should be discussed openly between you and your partner.  Can I wear regular clothing? You do not need to wear special clothing. Ostomy pouches are fairly flat and barely noticeable. Elastic undergarments will not  hurt the stoma or prevent the ostomy from functioning.  Can I participate in sports? An ostomy should not limit your involvement in sports. Many people with ostomies are runners, skiers, swimmers or participate in other active lifestyles. Talk with your caregiver first before doing heavy physical activity.  Will you starve?  Not if you follow doctor's orders at each stage of your post-op adjustment. There is no such thing as an "ostomy diet". Some people with an ostomy will be able to eat and tolerate anything; others may find diffi-culty with some foods. Each person is an individual and must determine, by trial, what is best for them. A good practice for all is to drink plenty of water.  Will you be a social outcast?  Have you met anyone who has an ostomy and is a social outcast? Why should you be the first? Only your attitude and self image will effect how you are treated. No confi-dent person is an Occupational psychologist.   PROFESSIONAL HELP  Resources are available if you need help or have questions about your ostomy.    Specially trained nurses called Wound, Ostomy Continence Nurses (WOCN) are available for consultation in most major medical centers.   Consider getting an ostomy consult with Cena Benton at [facility] to  help troubleshoot pouch fittings and other issues with your ostomy: [phone]   The [employer] Ostomy Association (UOA) is a group made up of many local chapters throughout the [place]. These local groups hold meetings and provide support to prospective and existing ostomates. They sponsor educational events and have qualified visitors to make personal or telephone visits. Contact the UOA for the chapter nearest you and for other educational publications.  More detailed information can be found in Colostomy Guide, a publication of the [employer] (UOA). Contact UOA at 1-7243477263 or visit their web site at https://arellano.com/. The website contains links to  other sites, suppliers and resources. Document Released: 04/06/2003 Document Revised: 06/26/2011 Document Reviewed: 08/05/2008 [facility] Patient Information 2013 [place].

## 2013-09-03 NOTE — Progress Notes (Signed)
Subjective:     Patient ID: Richard [name], male   DOB: 05-19-1934, 79 y.o.   MRN: [id number]  HPI   Note: This dictation was prepared with Dragon/digital dictation along with [facility] technology. Any transcriptional errors that result from this process are unintentional.       Richard [name]  April 15, 1935 [id number]  Patient Care Team: Leonard Downing, MD as PCP - General (Family Medicine) Adin Hector, MD as Consulting Physician (General Surgery) Wonda Horner, MD as Consulting Physician (Gastroenterology) Linna Hoff, MD as Consulting Physician (Orthopedic Surgery) Hayden Pedro, MD as Consulting Physician (Ophthalmology) Carola Frost, RN as Registered Nurse (Oncology)  This patient is a 79 y.o.male who presents today for surgical evaluation at the request of Dr. Penelope Coop.   Reason for visit: Rectal cancer followup  Pleasant gentleman.  Comes today with his wife.  Both heavy smokers.  Found to have bulky rectal cancer.  Seen by the multidisciplinary group.  This completed neoadjuvant chemoradiation therapy.  Skin ulceration on his tailbone but that is improving with the help of Neosporin and gentle care by his wife.  No more bleeding.  Mild mucus anal drainage but not severe.  No fevers or chills.  Eating okay.  Working out in the garden and staying active.  Completed chemotherapy 2 weeks ago.  Gradually recovering.   Patient Active Problem List   Diagnosis Date Noted  . Rectal adenocarcinoma (7cm posterior) 06/16/2013  . Tobacco abuse 06/16/2013  . Diastasis recti 06/16/2013  . Kyphoscoliosis deformity of spine     Past Medical History  Diagnosis Date  . Rectal adenocarcinoma (7cm posterior) 06/16/2013  . Kyphoscoliosis deformity of spine   . Colon cancer 05/29/13    invasive adenocarcinoma  . Cataract     surgery 2 years ago    Past Surgical History  Procedure Laterality Date  . I&d extremity  06/07/2011    Procedure: IRRIGATION AND DEBRIDEMENT EXTREMITY;   Surgeon: Linna Hoff, MD;  Location: [place];  Service: Orthopedics;  Laterality: Left;  . Amputation  06/07/2011    Procedure: AMPUTATION DIGIT;  Surgeon: Linna Hoff, MD;  Location: [facility];  Service: Orthopedics;  Laterality: Left;  Revision multiple fingers index and middle   . Eus N/A 06/25/2013    Procedure: LOWER ENDOSCOPIC ULTRASOUND (EUS);  Surgeon: Arta Silence, MD;  Location: [facility];  Service: Endoscopy;  Laterality: N/A;  . Colonoscopy w/ biopsies  05/29/13  . Eus  06/25/13    History   Social History  . Marital Status: Married    Spouse Name: N/A    Number of Children: N/A  . Years of Education: N/A   Occupational History  . Not on file.   Social History Main Topics  . Smoking status: Current Every Day Smoker -- 1.00 packs/day for 64 years    Types: Cigarettes  . Smokeless tobacco: Not on file     Comment: 07/16/13 currently 1/2 ppd  . Alcohol Use: 3.0 oz/week    5 Cans of beer per week  . Drug Use: No  . Sexual Activity: Not Currently   Other Topics Concern  . Not on file   Social History Narrative  . No narrative on file    Family History  Problem Relation Age of Onset  . Lung cancer Father   . Kidney cancer Brother   . Cancer - Other Mother     "upper stomach"  . Lung cancer Sister     Current  Outpatient Prescriptions  Medication Sig Dispense Refill  . capecitabine (XELODA) 500 MG tablet Take 3 tablets (= 1500mg ) in AM.  Take 3 tablets (=1500mg ) in PM.  Total of 3000 mg daily on days of Radiation Only.  168 tablet  0  . docusate sodium (COLACE) 50 MG capsule Take 1 capsule (50 mg total) by mouth 2 (two) times daily.  10 capsule  0  . Multiple Vitamin (MULTIVITAMIN) tablet Take 1 tablet by mouth daily.      . ondansetron (ZOFRAN) 8 MG tablet Take 1 tablet (8 mg total) by mouth 2 (two) times daily as needed for nausea or vomiting.  20 tablet  1   No current facility-administered medications for this visit.     No Known Allergies  BP 114/70   Pulse 68  Temp(Src) 97.3 F (36.3 C)  Ht 5\' 11"  (1.803 m)  Wt 152 lb (68.947 kg)  BMI 21.21 kg/m2  No results found.   Review of Systems  Constitutional: Negative for fever, chills and diaphoresis.  HENT: Negative for ear discharge, facial swelling, mouth sores, nosebleeds, sore throat and trouble swallowing.   Eyes: Negative for photophobia, discharge and visual disturbance.  Respiratory: Negative for choking, chest tightness, shortness of breath and stridor.   Cardiovascular: Negative for chest pain and palpitations.  Gastrointestinal: Negative for nausea, vomiting, abdominal pain, diarrhea, constipation, blood in stool, abdominal distention, anal bleeding and rectal pain.  Endocrine: Negative for cold intolerance and heat intolerance.  Genitourinary: Negative for dysuria, urgency, difficulty urinating and testicular pain.  Musculoskeletal: Negative for arthralgias, back pain, gait problem and myalgias.  Skin: Positive for wound. Negative for color change, pallor and rash.  Allergic/Immunologic: Negative for environmental allergies and food allergies.  Neurological: Negative for dizziness, speech difficulty, weakness, numbness and headaches.  Hematological: Negative for adenopathy. Does not bruise/bleed easily.  Psychiatric/Behavioral: Negative for hallucinations, confusion and agitation.       Objective:   Physical Exam  Constitutional: He is oriented to person, place, and time. He appears well-developed. He appears cachectic. He is cooperative.  Non-toxic appearance. He does not have a sickly appearance. No distress.  HENT:  Head: Normocephalic.  Mouth/Throat: Oropharynx is clear and moist. No oropharyngeal exudate.  Eyes: Conjunctivae and EOM are normal. Pupils are equal, round, and reactive to light. No scleral icterus.  Neck: Trachea normal. Rigidity present. No tracheal deviation present.    Cardiovascular: Normal rate, regular rhythm and intact distal pulses.     Pulmonary/Chest: Effort normal and breath sounds normal. No stridor. No respiratory distress.  Abdominal: Soft. He exhibits no distension. There is no tenderness. Hernia confirmed negative in the right inguinal area and confirmed negative in the left inguinal area.    Genitourinary: Rectal exam shows mass and tenderness.     Perianal skin clean with good hygiene.  No pruritis.  Some hyperpigmentation in the sacral pilonidal region.  A 3 x 2 cm superficial decubitus.  No fissure.  No abscess/fistula.  External hemorrhoids moderate.  Normal sphincter tone.  Tolerates digital rectal exam. Hemorrhoidal piles mild/mod enlarged  Obvious circumferential fungating rectal mass, more mobile most distal margin ~9cm proximal to anal sphincter, above prostate.  Much less sensitive.   Musculoskeletal: Normal range of motion. He exhibits no tenderness.       Hands: Lymphadenopathy:       Head (right side): No submental, no submandibular, no posterior auricular and no occipital adenopathy present.       Head (left side): No submental, no  submandibular, no posterior auricular and no occipital adenopathy present.    He has no cervical adenopathy.    He has no axillary adenopathy.       Right: No inguinal and no supraclavicular adenopathy present.       Left: No inguinal and no supraclavicular adenopathy present.  Neurological: He is alert and oriented to person, place, and time. No cranial nerve deficit. He exhibits normal muscle tone. Coordination normal.  Skin: Skin is warm and dry. No rash noted. No erythema. No pallor.  Psychiatric: He has a normal mood and affect. His behavior is normal. Judgment and thought content normal.       Assessment:     Bulky mid to proximal rectal cancer in elderly smoking male s/p completion of neoadjuvant chemoXRT 08/21/2013    Plan:     I spent over 30 minutes hour reviewing his chart, studies, history, and physical, instructions, orders, etc  Plan surgical  segmental LAR resection.  Pperform minimally invasive (robotic/laparoscopic).  Would need to be at least 8-10 weeks out = mid/late July.  I suspect he will need diverting loop ileostomy to protect the anastomosis as it will be within 5 cm of the anal verge to get good margins & smoker.  Preop ileostomy marking ordered & stoma training box given:  The anatomy & physiology of the digestive tract was discussed.  The pathophysiology was discussed.  Natural history risks without surgery was discussed.   I worked to give an overview of the disease and the frequent need to have multispecialty involvement.  I feel the risks of no intervention will lead to serious problems that outweigh the operative risks; therefore, I recommended a partial proctocolectomy to remove the pathology.  Minimally Invasive (Robotic/Laparoscopic) & open techniques were discussed.  We will work to preserve anal & pelvic floor function without sacrificing cure.  Risks such as bleeding, infection, abscess, leak, reoperation, possible ostomy, hernia, heart attack, death, and other risks were discussed.  I noted a good likelihood this will help address the problem.   Goals of post-operative recovery were discussed as well.  We will work to minimize complications.  An educational handout on the pathology was given as well.  Questions were answered.    The patient & his wife express understanding & wishes to proceed with surgery.  AGAIN, I strongly encouraged him to quit smoking.  He has cut back "some."  We talked to the patient about the dangers of smoking.  We stressed that tobacco use dramatically increases the risk of peri-operative complications such as infection, tissue necrosis leaving to problems with incision/wound and organ healing, hernia, chronic pain, heart attack, stroke, DVT, pulmonary embolism, and death.  We noted there are programs in our community to help stop smoking.  Information was available.

## 2013-09-05 ENCOUNTER — Ambulatory Visit (HOSPITAL_BASED_OUTPATIENT_CLINIC_OR_DEPARTMENT_OTHER): Payer: Medicare Other | Admitting: Oncology

## 2013-09-05 ENCOUNTER — Ambulatory Visit (HOSPITAL_BASED_OUTPATIENT_CLINIC_OR_DEPARTMENT_OTHER): Payer: Medicare Other

## 2013-09-05 ENCOUNTER — Telehealth: Payer: Self-pay | Admitting: Oncology

## 2013-09-05 VITALS — BP 122/68 | HR 75 | Temp 97.8°F | Resp 18 | Ht 71.0 in | Wt 152.5 lb

## 2013-09-05 DIAGNOSIS — Z809 Family history of malignant neoplasm, unspecified: Secondary | ICD-10-CM

## 2013-09-05 DIAGNOSIS — C20 Malignant neoplasm of rectum: Secondary | ICD-10-CM

## 2013-09-05 DIAGNOSIS — D649 Anemia, unspecified: Secondary | ICD-10-CM

## 2013-09-05 DIAGNOSIS — F17200 Nicotine dependence, unspecified, uncomplicated: Secondary | ICD-10-CM

## 2013-09-05 LAB — CBC WITH DIFFERENTIAL/PLATELET
BASO%: 0.8 % (ref 0.0–2.0)
Basophils Absolute: 0 10*3/uL (ref 0.0–0.1)
EOS%: 1.2 % (ref 0.0–7.0)
Eosinophils Absolute: 0 10*3/uL (ref 0.0–0.5)
HEMATOCRIT: 28.1 % — AB (ref 38.4–49.9)
HGB: 8.9 g/dL — ABNORMAL LOW (ref 13.0–17.1)
LYMPH%: 11.5 % — AB (ref 14.0–49.0)
MCH: 30.5 pg (ref 27.2–33.4)
MCHC: 31.7 g/dL — ABNORMAL LOW (ref 32.0–36.0)
MCV: 96.2 fL (ref 79.3–98.0)
MONO#: 0.4 10*3/uL (ref 0.1–0.9)
MONO%: 21.2 % — AB (ref 0.0–14.0)
NEUT%: 65.3 % (ref 39.0–75.0)
NEUTROS ABS: 1.4 10*3/uL — AB (ref 1.5–6.5)
PLATELETS: 147 10*3/uL (ref 140–400)
RBC: 2.92 10*6/uL — ABNORMAL LOW (ref 4.20–5.82)
RDW: 19.7 % — ABNORMAL HIGH (ref 11.0–14.6)
WBC: 2.1 10*3/uL — ABNORMAL LOW (ref 4.0–10.3)
lymph#: 0.2 10*3/uL — ABNORMAL LOW (ref 0.9–3.3)

## 2013-09-05 NOTE — Progress Notes (Signed)
  [facility] OFFICE PROGRESS NOTE   Diagnosis: Rectal cancer  INTERVAL HISTORY:   He completed Xeloda and radiation 08/20/2013. The skin breakdown at the groin and perineum is improving. There is a small remaining linear ulcer at the mid gluteal fold. He saw Dr. Johney [place] and is being scheduled for surgery in July. Mr. Defalco reports an improved energy level after receiving the red cell transfusion 08/15/2013. Rectal bleeding has resolved. He is having frequent soft bowel movements.  Objective:  Vital signs in last 24 hours:  There were no vitals taken for this visit.    HEENT: No thrush or ulcers Lymphatics: No cervical, supraclavicular, or inguinal nodes Resp: Distant breath sounds, no respiratory distress Cardio: Regular rate and rhythm GI: No hepatomegaly, nontender Vascular: No leg edema  Skin: Radiation hyperpigmentation at the groin and perineum. Approximate 2 cm linear ulceration at the mid gluteal fold.     Lab Results:  CBC pending Imaging:  No results found.  Medications: I have reviewed the patient's current medications.  Assessment/Plan: 1. Clinical stage III ([id number]) adenocarcinoma of the rectum. Tumor microsatellite stable by PCR.  Initiation of neoadjuvant radiation and Xeloda 07/14/2013, completed 08/20/2013 2. Ongoing tobacco use. 3. "Diarrhea" and rectal bleeding secondary to #1. Resolved. 4. Multiple colon polyps on a colonoscopy 05/29/2013, tubular adenomas and a tubulovillous adenoma. 5. Family history multiple cancers. 6. History of Weight loss. His weight has stabilized over the past month. 7. Anemia likely secondary to a combination of rectal bleeding and Xeloda/radiation. He is status post a red cell transfusion 08/15/2013.   Disposition:  Mr. Gill has completed neoadjuvant therapy. He is being scheduled for surgery with Dr. Johney [place] in July. I recommended he discontinue smoking. We will see him in late July to discuss the  indication for adjuvant systemic therapy.  We will check a CBC as he leaves the clinic today.  Ladell Pier, MD  09/05/2013  8:57 AM

## 2013-09-05 NOTE — Telephone Encounter (Signed)
gv pt appt schedule for july °

## 2013-09-09 ENCOUNTER — Telehealth: Payer: Self-pay | Admitting: *Deleted

## 2013-09-09 DIAGNOSIS — C20 Malignant neoplasm of rectum: Secondary | ICD-10-CM

## 2013-09-09 NOTE — Telephone Encounter (Signed)
Message copied by Brien Few on Tue Sep 09, 2013  1:50 PM ------      Message from: Ladell Pier      Created: Fri Sep 05, 2013 12:52 PM       Please call patient, hb is better, f/u as scheduled, add cbc day of radiation appt. In June ------

## 2013-09-09 NOTE — Telephone Encounter (Signed)
Called pt with lab results. He understands we will recheck lab day of visit with Dr. Lisbeth Renshaw.

## 2013-09-10 ENCOUNTER — Telehealth: Payer: Self-pay | Admitting: Oncology

## 2013-09-10 NOTE — Telephone Encounter (Signed)
S/w the pt and he is aware of his lab appt on 09/25/2013@4 :00pm

## 2013-09-24 ENCOUNTER — Encounter: Payer: Self-pay | Admitting: Radiation Oncology

## 2013-09-25 ENCOUNTER — Ambulatory Visit
Admission: RE | Admit: 2013-09-25 | Discharge: 2013-09-25 | Disposition: A | Discharge status: Home / Self Care | Payer: Medicare Other | Source: Ambulatory Visit | Attending: Radiation Oncology | Admitting: Radiation Oncology

## 2013-09-25 ENCOUNTER — Other Ambulatory Visit (HOSPITAL_BASED_OUTPATIENT_CLINIC_OR_DEPARTMENT_OTHER): Payer: Medicare Other

## 2013-09-25 VITALS — BP 125/63 | HR 68 | Temp 98.4°F | Wt 155.5 lb

## 2013-09-25 DIAGNOSIS — D649 Anemia, unspecified: Secondary | ICD-10-CM

## 2013-09-25 DIAGNOSIS — C20 Malignant neoplasm of rectum: Secondary | ICD-10-CM

## 2013-09-25 HISTORY — DX: Personal history of irradiation: Z92.3

## 2013-09-25 LAB — CBC WITH DIFFERENTIAL/PLATELET
BASO%: 0.7 % (ref 0.0–2.0)
BASOS ABS: 0 10*3/uL (ref 0.0–0.1)
EOS ABS: 0 10*3/uL (ref 0.0–0.5)
EOS%: 0.7 % (ref 0.0–7.0)
HCT: 27.8 % — ABNORMAL LOW (ref 38.4–49.9)
HGB: 9 g/dL — ABNORMAL LOW (ref 13.0–17.1)
LYMPH%: 16.9 % (ref 14.0–49.0)
MCH: 29.9 pg (ref 27.2–33.4)
MCHC: 32.2 g/dL (ref 32.0–36.0)
MCV: 92.9 fL (ref 79.3–98.0)
MONO#: 0.6 10*3/uL (ref 0.1–0.9)
MONO%: 23.5 % — ABNORMAL HIGH (ref 0.0–14.0)
NEUT#: 1.4 10*3/uL — ABNORMAL LOW (ref 1.5–6.5)
NEUT%: 58.2 % (ref 39.0–75.0)
Platelets: 165 10*3/uL (ref 140–400)
RBC: 3 10*6/uL — AB (ref 4.20–5.82)
RDW: 18.1 % — AB (ref 11.0–14.6)
WBC: 2.4 10*3/uL — ABNORMAL LOW (ref 4.0–10.3)
lymph#: 0.4 10*3/uL — ABNORMAL LOW (ref 0.9–3.3)

## 2013-09-25 NOTE — Progress Notes (Signed)
Patient for routine follow up completion of radiation for rectal carcinoma 07/14/13-08/20/13.Denies pain.Surgical date with Dr.Gross is October 28, 2013.Reviewed process of pre-op and to look for call with in 1 to 2 weeks prior to procedure.Follow up with Dr.Sherrill after surgery.Denies having diarrhea or constipation.verbalizes skin around perineum has healed except for one small spot.

## 2013-09-26 NOTE — Progress Notes (Signed)
  Radiation Oncology         [phone] ________________________________  Name: Richard [name] MRN: [id number]  Date: 09/25/2013  DOB: 04/24/1934  Follow-Up Visit Note  CC: Leonard Downing, MD  Adin Hector., MD  Diagnosis:   Rectal cancer  Interval Since Last Radiation:  Approximately one month   Narrative:  The patient returns today for routine follow-up.  The patient states that things have gone well since the end of treatment in terms of recovery. No significant ongoing  issues in terms of dysuria, rectal irritation. No blood per rectum.                         ALLERGIES:  has No Known Allergies.  Meds: Current Outpatient Prescriptions  Medication Sig Dispense Refill  . docusate sodium (COLACE) 50 MG capsule Take 1 capsule (50 mg total) by mouth 2 (two) times daily.  10 capsule  0  . Multiple Vitamin (MULTIVITAMIN) tablet Take 1 tablet by mouth daily.      . capecitabine (XELODA) 500 MG tablet Take 3 tablets (= 1500mg ) in AM.  Take 3 tablets (=1500mg ) in PM.  Total of 3000 mg daily on days of Radiation Only.  168 tablet  0  . metroNIDAZOLE (FLAGYL) 500 MG tablet Take 1 tablet (500 mg total) by mouth as directed. Take 2 pills (=1000mg ) by mouth at 1pm, 3pm, and 10pm the day before your colorectal operation as discussed in [place] office.  Call [phone] with questions  6 tablet  0  . neomycin (MYCIFRADIN) 500 MG tablet Take 2 tablets (1,000 mg total) by mouth as directed. Take 2 pills (=1000mg ) by mouth at 1pm, 3pm, and 10pm the day before your colorectal operation as discussed in [place] office.  Call [phone] with questions  6 tablet  0  . ondansetron (ZOFRAN) 8 MG tablet Take 1 tablet (8 mg total) by mouth 2 (two) times daily as needed for nausea or vomiting.  20 tablet  1   No current facility-administered medications for this encounter.    Physical Findings: The patient is in no acute distress. Patient is alert and oriented.  weight is 155 lb 8 oz (70.534 kg). His  temperature is 98.4 F (36.9 C). His blood pressure is 125/63 and his pulse is 68. His oxygen saturation is 99%. .   The anal region is well healed with 1 small remaining spot of desquamation   Lab Findings: Lab Results  Component Value Date   WBC 2.4* 09/25/2013   HGB 9.0* 09/25/2013   HCT 27.8* 09/25/2013   MCV 92.9 09/25/2013   PLT 165 09/25/2013     Radiographic Findings: No results found.  Impression:    The patient is doing well approximately one month after completing preoperative chemoradiotherapy for rectal cancer. Surgery is scheduled on 11/10/2013 with Dr. gross..   Plan:  The patient will followup in 6 months.   Jodelle Gross, M.D., Ph.D.

## 2013-10-15 ENCOUNTER — Encounter (HOSPITAL_COMMUNITY): Payer: Self-pay | Admitting: Pharmacy Technician

## 2013-10-22 ENCOUNTER — Encounter (HOSPITAL_COMMUNITY)
Admission: RE | Admit: 2013-10-22 | Discharge: 2013-10-22 | Disposition: A | Discharge status: Home / Self Care | Payer: Medicare Other | Source: Ambulatory Visit | Attending: Surgery | Admitting: Surgery

## 2013-10-22 ENCOUNTER — Encounter (HOSPITAL_COMMUNITY): Payer: Self-pay

## 2013-10-22 ENCOUNTER — Other Ambulatory Visit: Payer: Self-pay

## 2013-10-22 DIAGNOSIS — Z01812 Encounter for preprocedural laboratory examination: Secondary | ICD-10-CM | POA: Insufficient documentation

## 2013-10-22 HISTORY — DX: Unspecified hemorrhoids: K64.9

## 2013-10-22 HISTORY — DX: Psoriasis, unspecified: L40.9

## 2013-10-22 HISTORY — DX: Personal history of other medical treatment: Z92.89

## 2013-10-22 LAB — BASIC METABOLIC PANEL
ANION GAP: 10 (ref 5–15)
BUN: 8 mg/dL (ref 6–23)
CALCIUM: 9.2 mg/dL (ref 8.4–10.5)
CO2: 26 meq/L (ref 19–32)
Chloride: 103 mEq/L (ref 96–112)
Creatinine, Ser: 0.82 mg/dL (ref 0.50–1.35)
GFR calc Af Amer: >90 mL/min (ref >90)
GFR, EST NON AFRICAN AMERICAN: 84 mL/min — AB (ref >90)
Glucose, Bld: 99 mg/dL (ref 70–99)
POTASSIUM: 4.2 meq/L (ref 3.7–5.3)
SODIUM: 139 meq/L (ref 137–147)

## 2013-10-22 LAB — CBC
HCT: 34.1 % — ABNORMAL LOW (ref 39.0–52.0)
Hemoglobin: 10.4 g/dL — ABNORMAL LOW (ref 13.0–17.0)
MCH: 28.9 pg (ref 26.0–34.0)
MCHC: 30.5 g/dL (ref 30.0–36.0)
MCV: 94.7 fL (ref 78.0–100.0)
PLATELETS: 156 10*3/uL (ref 150–400)
RBC: 3.6 MIL/uL — ABNORMAL LOW (ref 4.22–5.81)
RDW: 17.2 % — ABNORMAL HIGH (ref 11.5–15.5)
WBC: 3.3 10*3/uL — ABNORMAL LOW (ref 4.0–10.5)

## 2013-10-22 NOTE — Consult Note (Signed)
WOC ostomy consult note Patient seen for pre-operative stoma site selection per Dr. Johney [place]' request.  DOS is 10/27/2013.  Both an ileostomy and a colostomy site is requested. Patient presents today with his wife for preoperative testing. Abdomen is assessed in the sitting and standing positions.  He wears his belt approximately 5 inches below his umbilicus. Sites selected are within the abdominal rectus muscle and within sight of the patient while standing.  Right MQ ileostomy site is 6cm to the right of the umbilicus and level with the umbilicus. Left MQ colostomy site is 6cm to the left of the umbilicus and level with the umbilicus. Patient and wife received education regarding basic A&P of the GI tract, stoma characteristics and pouch characteristics. Diet and lifestyle as it pertains to activity, bathing, and intimacy provided. Questions answered.  Patient will be seen in the days following surgery on Tuesday, July 14th for post-operative ostomy education. Thank you for requesting pre-operative teaching and stoma site selection. Maudie Flakes, MSN, RN, [place], [place], [place] [phone])

## 2013-10-22 NOTE — Patient Instructions (Addendum)
Richard [name]  10/22/2013                           YOUR PROCEDURE IS SCHEDULED ON: 10/16/2013 AT 7:30 AM               ENTER [place] ENTRANCE AND                            FOLLOW  SIGNS TO SHORT STAY CENTER                 ARRIVE AT SHORT STAY AT:  5:30 AM               CALL THIS NUMBER IF ANY PROBLEMS THE DAY OF SURGERY :               [phone]                                REMEMBER:   Do not eat food or drink liquids AFTER MIDNIGHT            Take these medicines the morning of surgery with               A SIPS OF WATER :    NONE             STOP ASPIRIN AND VITAMINS 5 DAYS PREOP    Do not wear jewelry, make-up   Do not wear lotions, powders, or perfumes.   Do not shave legs or underarms 12 hrs. before surgery (men may shave face)  Do not bring valuables to the hospital.  Contacts, dentures or bridgework may not be worn into surgery.  Leave suitcase in the car. After surgery it may be brought to your room.  For patients admitted to the hospital more than one night, checkout time is            11:00 AM                                                      ________________________________________________________________________                                                                        [place]  Before surgery, you can play an important role.  Because skin is not sterile, your skin needs to be as free of germs as possible.  You can reduce the number of germs on your skin by washing with CHG (chlorahexidine gluconate) soap before surgery.  CHG is an antiseptic cleaner which kills germs and bonds with the skin to continue killing germs even after washing. Please DO NOT use if you have an allergy to CHG or antibacterial soaps.  If your skin becomes reddened/irritated stop using the CHG and inform your nurse when you arrive at Short Stay. Do not shave (including legs and underarms) for at least 48  hours  prior to the first CHG shower.  You may shave your face. Please follow these instructions carefully:   1.  Shower with CHG Soap the night before surgery and the  morning of Surgery.   2.  If you choose to wash your hair, wash your hair first as usual with your  normal  Shampoo.   3.  After you shampoo, rinse your hair and body thoroughly to remove the  shampoo.                                         4.  Use CHG as you would any other liquid soap.  You can apply chg directly  to the skin and wash . Gently wash with scrungie or clean wascloth    5.  Apply the CHG Soap to your body ONLY FROM THE NECK DOWN.   Do not use on open                           Wound or open sores. Avoid contact with eyes, ears mouth and genitals (private parts).                        Genitals (private parts) with your normal soap.              6.  Wash thoroughly, paying special attention to the area where your surgery  will be performed.   7.  Thoroughly rinse your body with warm water from the neck down.   8.  DO NOT shower/wash with your normal soap after using and rinsing off  the CHG Soap .                9.  Pat yourself dry with a clean towel.             10.  Wear clean pajamas.             11.  Place clean sheets on your bed the night of your first shower and do not  sleep with pets.  Day of Surgery : Do not apply any lotions/deodorants the morning of surgery.  Please wear clean clothes to the hospital/surgery center.  FAILURE TO FOLLOW THESE INSTRUCTIONS MAY RESULT IN THE CANCELLATION OF YOUR SURGERY    PATIENT SIGNATURE_________________________________  ______________________________________________________________________    WHAT IS A BLOOD TRANSFUSION? Blood Transfusion Information  A transfusion is the replacement of blood or some of its parts. Blood is made up of multiple cells which provide different functions.  Red blood cells carry oxygen and are used for blood loss  replacement.  White blood cells fight against infection.  Platelets control bleeding.  Plasma helps clot blood.  Other blood products are available for specialized needs, such as hemophilia or other clotting disorders. BEFORE THE TRANSFUSION  Who gives blood for transfusions?   Healthy volunteers who are fully evaluated to make sure their blood is safe. This is blood bank blood. Transfusion therapy is the safest it has ever been in the practice of medicine. Before blood is taken from a donor, a complete history is taken to make sure that person has no history of diseases nor engages in risky social behavior (examples are intravenous drug use or sexual activity with multiple partners). The donor's travel history is screened to minimize risk of  transmitting infections, such as malaria. The donated blood is tested for signs of infectious diseases, such as HIV and hepatitis. The blood is then tested to be sure it is compatible with you in order to minimize the chance of a transfusion reaction. If you or a relative donates blood, this is often done in anticipation of surgery and is not appropriate for emergency situations. It takes many days to process the donated blood. RISKS AND COMPLICATIONS Although transfusion therapy is very safe and saves many lives, the main dangers of transfusion include:   Getting an infectious disease.  Developing a transfusion reaction. This is an allergic reaction to something in the blood you were given. Every precaution is taken to prevent this. The decision to have a blood transfusion has been considered carefully by your caregiver before blood is given. Blood is not given unless the benefits outweigh the risks. AFTER THE TRANSFUSION  Right after receiving a blood transfusion, you will usually feel much better and more energetic. This is especially true if your red blood cells have gotten low (anemic). The transfusion raises the level of the red blood cells which  carry oxygen, and this usually causes an energy increase.  The nurse administering the transfusion will monitor you carefully for complications. HOME CARE INSTRUCTIONS  No special instructions are needed after a transfusion. You may find your energy is better. Speak with your caregiver about any limitations on activity for underlying diseases you may have. SEEK MEDICAL CARE IF:   Your condition is not improving after your transfusion.  You develop redness or irritation at the intravenous (IV) site. SEEK IMMEDIATE MEDICAL CARE IF:  Any of the following symptoms occur over the next 12 hours:  Shaking chills.  You have a temperature by mouth above 102 F (38.9 C), not controlled by medicine.  Chest, back, or muscle pain.  People around you feel you are not acting correctly or are confused.  Shortness of breath or difficulty breathing.  Dizziness and fainting.  You get a rash or develop hives.  You have a decrease in urine output.  Your urine turns a dark color or changes to pink, red, or brown. Any of the following symptoms occur over the next 10 days:  You have a temperature by mouth above 102 F (38.9 C), not controlled by medicine.  Shortness of breath.  Weakness after normal activity.  The white part of the eye turns yellow (jaundice).  You have a decrease in the amount of urine or are urinating less often.  Your urine turns a dark color or changes to pink, red, or brown. Document Released: 03/31/2000 Document Revised: 06/26/2011 Document Reviewed: 11/18/2007 [facility] Patient Information 2014 [place], [place].  _______________________________________________________________________

## 2013-10-28 ENCOUNTER — Inpatient Hospital Stay (HOSPITAL_COMMUNITY): Payer: Medicare Other | Admitting: Anesthesiology

## 2013-10-28 ENCOUNTER — Inpatient Hospital Stay (HOSPITAL_COMMUNITY)
Admission: RE | Admit: 2013-10-28 | Discharge: 2013-11-15 | DRG: 329 | Disposition: E | Payer: Medicare Other | Source: Ambulatory Visit | Attending: General Surgery | Admitting: General Surgery

## 2013-10-28 ENCOUNTER — Encounter (HOSPITAL_COMMUNITY): Admission: RE | Disposition: E | Payer: Self-pay | Source: Ambulatory Visit | Attending: General Surgery

## 2013-10-28 ENCOUNTER — Encounter (HOSPITAL_COMMUNITY): Payer: Medicare Other | Admitting: Anesthesiology

## 2013-10-28 ENCOUNTER — Encounter (HOSPITAL_COMMUNITY): Payer: Self-pay | Admitting: *Deleted

## 2013-10-28 DIAGNOSIS — Y836 Removal of other organ (partial) (total) as the cause of abnormal reaction of the patient, or of later complication, without mention of misadventure at the time of the procedure: Secondary | ICD-10-CM | POA: Diagnosis not present

## 2013-10-28 DIAGNOSIS — C19 Malignant neoplasm of rectosigmoid junction: Secondary | ICD-10-CM | POA: Diagnosis present

## 2013-10-28 DIAGNOSIS — E878 Other disorders of electrolyte and fluid balance, not elsewhere classified: Secondary | ICD-10-CM | POA: Diagnosis not present

## 2013-10-28 DIAGNOSIS — J441 Chronic obstructive pulmonary disease with (acute) exacerbation: Secondary | ICD-10-CM | POA: Diagnosis not present

## 2013-10-28 DIAGNOSIS — S68118A Complete traumatic metacarpophalangeal amputation of other finger, initial encounter: Secondary | ICD-10-CM

## 2013-10-28 DIAGNOSIS — R34 Anuria and oliguria: Secondary | ICD-10-CM | POA: Diagnosis not present

## 2013-10-28 DIAGNOSIS — Z66 Do not resuscitate: Secondary | ICD-10-CM | POA: Diagnosis not present

## 2013-10-28 DIAGNOSIS — R6520 Severe sepsis without septic shock: Secondary | ICD-10-CM

## 2013-10-28 DIAGNOSIS — K651 Peritoneal abscess: Secondary | ICD-10-CM | POA: Diagnosis not present

## 2013-10-28 DIAGNOSIS — F17200 Nicotine dependence, unspecified, uncomplicated: Secondary | ICD-10-CM | POA: Diagnosis present

## 2013-10-28 DIAGNOSIS — J9600 Acute respiratory failure, unspecified whether with hypoxia or hypercapnia: Secondary | ICD-10-CM | POA: Diagnosis not present

## 2013-10-28 DIAGNOSIS — K929 Disease of digestive system, unspecified: Secondary | ICD-10-CM | POA: Diagnosis not present

## 2013-10-28 DIAGNOSIS — E872 Acidosis, unspecified: Secondary | ICD-10-CM | POA: Diagnosis not present

## 2013-10-28 DIAGNOSIS — K560 Paralytic ileus: Secondary | ICD-10-CM | POA: Diagnosis present

## 2013-10-28 DIAGNOSIS — L821 Other seborrheic keratosis: Secondary | ICD-10-CM | POA: Diagnosis present

## 2013-10-28 DIAGNOSIS — C20 Malignant neoplasm of rectum: Secondary | ICD-10-CM | POA: Diagnosis present

## 2013-10-28 DIAGNOSIS — Z9221 Personal history of antineoplastic chemotherapy: Secondary | ICD-10-CM

## 2013-10-28 DIAGNOSIS — D6959 Other secondary thrombocytopenia: Secondary | ICD-10-CM | POA: Diagnosis not present

## 2013-10-28 DIAGNOSIS — Y9210 Unspecified residential institution as the place of occurrence of the external cause: Secondary | ICD-10-CM | POA: Diagnosis not present

## 2013-10-28 DIAGNOSIS — Z781 Physical restraint status: Secondary | ICD-10-CM | POA: Diagnosis not present

## 2013-10-28 DIAGNOSIS — T814XXD Infection following a procedure, subsequent encounter: Secondary | ICD-10-CM

## 2013-10-28 DIAGNOSIS — A419 Sepsis, unspecified organism: Secondary | ICD-10-CM | POA: Diagnosis not present

## 2013-10-28 DIAGNOSIS — D638 Anemia in other chronic diseases classified elsewhere: Secondary | ICD-10-CM

## 2013-10-28 DIAGNOSIS — C184 Malignant neoplasm of transverse colon: Secondary | ICD-10-CM

## 2013-10-28 DIAGNOSIS — R1310 Dysphagia, unspecified: Secondary | ICD-10-CM | POA: Diagnosis not present

## 2013-10-28 DIAGNOSIS — J9601 Acute respiratory failure with hypoxia: Secondary | ICD-10-CM

## 2013-10-28 DIAGNOSIS — R6521 Severe sepsis with septic shock: Secondary | ICD-10-CM

## 2013-10-28 DIAGNOSIS — E875 Hyperkalemia: Secondary | ICD-10-CM | POA: Diagnosis not present

## 2013-10-28 DIAGNOSIS — L98499 Non-pressure chronic ulcer of skin of other sites with unspecified severity: Secondary | ICD-10-CM | POA: Diagnosis present

## 2013-10-28 DIAGNOSIS — G9340 Encephalopathy, unspecified: Secondary | ICD-10-CM | POA: Diagnosis not present

## 2013-10-28 DIAGNOSIS — Z923 Personal history of irradiation: Secondary | ICD-10-CM

## 2013-10-28 DIAGNOSIS — R443 Hallucinations, unspecified: Secondary | ICD-10-CM | POA: Diagnosis not present

## 2013-10-28 DIAGNOSIS — E870 Hyperosmolality and hypernatremia: Secondary | ICD-10-CM | POA: Diagnosis not present

## 2013-10-28 DIAGNOSIS — R7309 Other abnormal glucose: Secondary | ICD-10-CM | POA: Diagnosis present

## 2013-10-28 DIAGNOSIS — E46 Unspecified protein-calorie malnutrition: Secondary | ICD-10-CM | POA: Diagnosis present

## 2013-10-28 DIAGNOSIS — F1010 Alcohol abuse, uncomplicated: Secondary | ICD-10-CM | POA: Diagnosis present

## 2013-10-28 DIAGNOSIS — J90 Pleural effusion, not elsewhere classified: Secondary | ICD-10-CM | POA: Diagnosis not present

## 2013-10-28 DIAGNOSIS — N179 Acute kidney failure, unspecified: Secondary | ICD-10-CM

## 2013-10-28 DIAGNOSIS — IMO0001 Reserved for inherently not codable concepts without codable children: Secondary | ICD-10-CM

## 2013-10-28 DIAGNOSIS — Z720 Tobacco use: Secondary | ICD-10-CM | POA: Diagnosis present

## 2013-10-28 HISTORY — DX: Anemia in other chronic diseases classified elsewhere: D63.8

## 2013-10-28 LAB — TYPE AND SCREEN
ABO/RH(D): A NEG
Antibody Screen: NEGATIVE

## 2013-10-28 LAB — CBC
HCT: 28.2 % — ABNORMAL LOW (ref 39.0–52.0)
Hemoglobin: 9.1 g/dL — ABNORMAL LOW (ref 13.0–17.0)
MCH: 29.6 pg (ref 26.0–34.0)
MCHC: 32.3 g/dL (ref 30.0–36.0)
MCV: 91.9 fL (ref 78.0–100.0)
Platelets: 162 10*3/uL (ref 150–400)
RBC: 3.07 MIL/uL — ABNORMAL LOW (ref 4.22–5.81)
RDW: 17.4 % — AB (ref 11.5–15.5)
WBC: 14.6 10*3/uL — AB (ref 4.0–10.5)

## 2013-10-28 LAB — CREATININE, SERUM
CREATININE: 0.87 mg/dL (ref 0.50–1.35)
GFR calc Af Amer: >90 mL/min (ref >90)
GFR calc non Af Amer: 82 mL/min — ABNORMAL LOW (ref >90)

## 2013-10-28 SURGERY — ROBOT ASSISTED LAPAROSCOPIC PARTIAL COLECTOMY
Anesthesia: General

## 2013-10-28 MED ORDER — DEXTROSE 5 % IV SOLN
2.0000 g | Freq: Two times a day (BID) | INTRAVENOUS | Status: AC
  Administered 2013-10-28: 2 g via INTRAVENOUS
  Filled 2013-10-28: qty 2

## 2013-10-28 MED ORDER — DEXAMETHASONE SODIUM PHOSPHATE 10 MG/ML IJ SOLN
INTRAMUSCULAR | Status: DC | PRN
  Administered 2013-10-28: 10 mg via INTRAVENOUS

## 2013-10-28 MED ORDER — MAGIC MOUTHWASH
15.0000 mL | Freq: Four times a day (QID) | ORAL | Status: DC | PRN
  Filled 2013-10-28: qty 15

## 2013-10-28 MED ORDER — BUPIVACAINE-EPINEPHRINE 0.25% -1:200000 IJ SOLN
INTRAMUSCULAR | Status: AC
  Filled 2013-10-28: qty 1

## 2013-10-28 MED ORDER — PHENOL 1.4 % MT LIQD: 2.0000 | OROMUCOSAL | Status: DC | PRN

## 2013-10-28 MED ORDER — LACTATED RINGERS IR SOLN
Status: DC | PRN
  Administered 2013-10-28: 3000 mL

## 2013-10-28 MED ORDER — METHYLENE BLUE 1 % INJ SOLN
INTRAMUSCULAR | Status: AC
  Filled 2013-10-28: qty 10

## 2013-10-28 MED ORDER — SUCCINYLCHOLINE CHLORIDE 20 MG/ML IJ SOLN
INTRAMUSCULAR | Status: DC | PRN
  Administered 2013-10-28: 100 mg via INTRAVENOUS

## 2013-10-28 MED ORDER — GLYCOPYRROLATE 0.2 MG/ML IJ SOLN
INTRAMUSCULAR | Status: AC
  Filled 2013-10-28: qty 3

## 2013-10-28 MED ORDER — PROPOFOL 10 MG/ML IV BOLUS
INTRAVENOUS | Status: DC | PRN
  Administered 2013-10-28: 120 mg via INTRAVENOUS

## 2013-10-28 MED ORDER — METOPROLOL TARTRATE 1 MG/ML IV SOLN
5.0000 mg | Freq: Four times a day (QID) | INTRAVENOUS | Status: DC | PRN
  Filled 2013-10-28: qty 5

## 2013-10-28 MED ORDER — FENTANYL CITRATE 0.05 MG/ML IJ SOLN
INTRAMUSCULAR | Status: DC | PRN
  Administered 2013-10-28: 25 ug via INTRAVENOUS
  Administered 2013-10-28: 50 ug via INTRAVENOUS
  Administered 2013-10-28: 25 ug via INTRAVENOUS
  Administered 2013-10-28 (×4): 50 ug via INTRAVENOUS
  Administered 2013-10-28: 25 ug via INTRAVENOUS
  Administered 2013-10-28: 50 ug via INTRAVENOUS

## 2013-10-28 MED ORDER — HEPARIN SODIUM (PORCINE) 5000 UNIT/ML IJ SOLN
5000.0000 [IU] | Freq: Three times a day (TID) | INTRAMUSCULAR | Status: DC
  Administered 2013-10-29: 5000 [IU] via SUBCUTANEOUS
  Filled 2013-10-28 (×4): qty 1

## 2013-10-28 MED ORDER — ROCURONIUM BROMIDE 100 MG/10ML IV SOLN
INTRAVENOUS | Status: AC
  Filled 2013-10-28: qty 1

## 2013-10-28 MED ORDER — BUPIVACAINE 0.25 % ON-Q PUMP DUAL CATH 300 ML
300.0000 mL | INJECTION | Status: DC
  Filled 2013-10-28: qty 300

## 2013-10-28 MED ORDER — BUPIVACAINE-EPINEPHRINE 0.25% -1:200000 IJ SOLN
INTRAMUSCULAR | Status: DC | PRN
Start: 2013-10-28 — End: 2013-10-28
  Administered 2013-10-28: 23 mL

## 2013-10-28 MED ORDER — FENTANYL CITRATE 0.05 MG/ML IJ SOLN
INTRAMUSCULAR | Status: AC
  Filled 2013-10-28: qty 5

## 2013-10-28 MED ORDER — MENTHOL 3 MG MT LOZG: 1.0000 | LOZENGE | OROMUCOSAL | Status: DC | PRN

## 2013-10-28 MED ORDER — HYDROMORPHONE HCL PF 1 MG/ML IJ SOLN
INTRAMUSCULAR | Status: AC
  Filled 2013-10-28: qty 1

## 2013-10-28 MED ORDER — PROPOFOL 10 MG/ML IV BOLUS
INTRAVENOUS | Status: AC
  Filled 2013-10-28: qty 20

## 2013-10-28 MED ORDER — ACETAMINOPHEN 500 MG PO TABS
1000.0000 mg | ORAL_TABLET | Freq: Three times a day (TID) | ORAL | Status: DC
  Administered 2013-10-28 – 2013-10-30 (×8): 1000 mg via ORAL
  Filled 2013-10-28 (×11): qty 2

## 2013-10-28 MED ORDER — PROMETHAZINE HCL 25 MG/ML IJ SOLN: 6.2500 mg | INTRAMUSCULAR | Status: DC | PRN

## 2013-10-28 MED ORDER — HYDROMORPHONE HCL PF 1 MG/ML IJ SOLN
0.2500 mg | INTRAMUSCULAR | Status: DC | PRN
  Administered 2013-10-28 (×2): 0.5 mg via INTRAVENOUS

## 2013-10-28 MED ORDER — ROCURONIUM BROMIDE 100 MG/10ML IV SOLN
INTRAVENOUS | Status: DC | PRN
  Administered 2013-10-28: 20 mg via INTRAVENOUS
  Administered 2013-10-28 (×3): 10 mg via INTRAVENOUS
  Administered 2013-10-28: 20 mg via INTRAVENOUS
  Administered 2013-10-28: 30 mg via INTRAVENOUS
  Administered 2013-10-28: 10 mg via INTRAVENOUS
  Administered 2013-10-28: 20 mg via INTRAVENOUS

## 2013-10-28 MED ORDER — ENALAPRILAT 1.25 MG/ML IV SOLN
0.6250 mg | Freq: Four times a day (QID) | INTRAVENOUS | Status: DC | PRN
  Filled 2013-10-28: qty 1

## 2013-10-28 MED ORDER — ALUM & MAG HYDROXIDE-SIMETH 200-200-20 MG/5ML PO SUSP
30.0000 mL | Freq: Four times a day (QID) | ORAL | Status: DC | PRN
Start: 2013-10-28 — End: 2013-10-31
  Administered 2013-10-30 – 2013-10-31 (×2): 30 mL via ORAL
  Filled 2013-10-28 (×2): qty 30

## 2013-10-28 MED ORDER — LACTATED RINGERS IV BOLUS (SEPSIS): 1000.0000 mL | Freq: Three times a day (TID) | INTRAVENOUS | Status: AC | PRN

## 2013-10-28 MED ORDER — ONDANSETRON HCL 4 MG/2ML IJ SOLN
INTRAMUSCULAR | Status: AC
  Filled 2013-10-28: qty 2

## 2013-10-28 MED ORDER — HEPARIN SODIUM (PORCINE) 5000 UNIT/ML IJ SOLN
5000.0000 [IU] | Freq: Once | INTRAMUSCULAR | Status: AC
  Administered 2013-10-28: 5000 [IU] via SUBCUTANEOUS
  Filled 2013-10-28: qty 1

## 2013-10-28 MED ORDER — ALVIMOPAN 12 MG PO CAPS
12.0000 mg | ORAL_CAPSULE | Freq: Two times a day (BID) | ORAL | Status: DC
  Administered 2013-10-29 – 2013-10-30 (×4): 12 mg via ORAL
  Filled 2013-10-28 (×6): qty 1

## 2013-10-28 MED ORDER — GLYCOPYRROLATE 0.2 MG/ML IJ SOLN
INTRAMUSCULAR | Status: DC | PRN
  Administered 2013-10-28: 0.6 mg via INTRAVENOUS

## 2013-10-28 MED ORDER — OXYCODONE HCL 5 MG PO TABS: 5.0000 mg | ORAL_TABLET | Freq: Once | ORAL | Status: DC | PRN

## 2013-10-28 MED ORDER — OXYCODONE HCL 5 MG PO TABS: 5.0000 mg | ORAL_TABLET | ORAL | Status: AC | PRN

## 2013-10-28 MED ORDER — SPOT INK MARKER SYRINGE KIT
PACK | SUBMUCOSAL | Status: DC | PRN
  Administered 2013-10-28: 5 mL via SUBMUCOSAL

## 2013-10-28 MED ORDER — LACTATED RINGERS IV SOLN: INTRAVENOUS | Status: DC

## 2013-10-28 MED ORDER — DEXTROSE 5 % IV SOLN
2.0000 g | INTRAVENOUS | Status: AC
  Administered 2013-10-28 (×2): 2 g via INTRAVENOUS
  Filled 2013-10-28: qty 2

## 2013-10-28 MED ORDER — SACCHAROMYCES BOULARDII 250 MG PO CAPS
250.0000 mg | ORAL_CAPSULE | Freq: Two times a day (BID) | ORAL | Status: DC
  Administered 2013-10-28 – 2013-10-30 (×5): 250 mg via ORAL
  Filled 2013-10-28 (×9): qty 1

## 2013-10-28 MED ORDER — LACTATED RINGERS IV SOLN
INTRAVENOUS | Status: DC | PRN
  Administered 2013-10-28 (×5): via INTRAVENOUS

## 2013-10-28 MED ORDER — NEOSTIGMINE METHYLSULFATE 10 MG/10ML IV SOLN
INTRAVENOUS | Status: DC | PRN
  Administered 2013-10-28: 4 mg via INTRAVENOUS

## 2013-10-28 MED ORDER — DIPHENHYDRAMINE HCL 50 MG/ML IJ SOLN: 12.5000 mg | Freq: Four times a day (QID) | INTRAMUSCULAR | Status: DC | PRN

## 2013-10-28 MED ORDER — NEOSTIGMINE METHYLSULFATE 10 MG/10ML IV SOLN
INTRAVENOUS | Status: AC
  Filled 2013-10-28: qty 1

## 2013-10-28 MED ORDER — DEXAMETHASONE SODIUM PHOSPHATE 10 MG/ML IJ SOLN
INTRAMUSCULAR | Status: AC
  Filled 2013-10-28: qty 1

## 2013-10-28 MED ORDER — OXYCODONE HCL 5 MG/5ML PO SOLN
5.0000 mg | Freq: Once | ORAL | Status: DC | PRN
  Filled 2013-10-28: qty 5

## 2013-10-28 MED ORDER — HYDROMORPHONE HCL PF 1 MG/ML IJ SOLN
0.5000 mg | INTRAMUSCULAR | Status: DC | PRN
  Administered 2013-10-29 (×2): 1 mg via INTRAVENOUS
  Administered 2013-10-30: 0.5 mg via INTRAVENOUS
  Administered 2013-10-30: 1 mg via INTRAVENOUS
  Filled 2013-10-28 (×4): qty 1

## 2013-10-28 MED ORDER — SODIUM CHLORIDE 0.9 % IV SOLN
INTRAVENOUS | Status: DC
  Filled 2013-10-28: qty 6

## 2013-10-28 MED ORDER — ONDANSETRON HCL 4 MG/2ML IJ SOLN
INTRAMUSCULAR | Status: DC | PRN
  Administered 2013-10-28: 4 mg via INTRAVENOUS

## 2013-10-28 MED ORDER — ALVIMOPAN 12 MG PO CAPS
12.0000 mg | ORAL_CAPSULE | Freq: Once | ORAL | Status: AC
  Administered 2013-10-28: 12 mg via ORAL
  Filled 2013-10-28: qty 1

## 2013-10-28 MED ORDER — MEPERIDINE HCL 50 MG/ML IJ SOLN: 6.2500 mg | INTRAMUSCULAR | Status: DC | PRN

## 2013-10-28 MED ORDER — BISMUTH SUBSALICYLATE 262 MG/15ML PO SUSP: 30.0000 mL | Freq: Three times a day (TID) | ORAL | Status: DC | PRN

## 2013-10-28 MED ORDER — CEFOTETAN DISODIUM-DEXTROSE 2-2.08 GM-% IV SOLR
INTRAVENOUS | Status: AC
  Filled 2013-10-28: qty 50

## 2013-10-28 MED ORDER — KCL IN DEXTROSE-NACL 40-5-0.9 MEQ/L-%-% IV SOLN
INTRAVENOUS | Status: DC
  Administered 2013-10-28 – 2013-10-31 (×6): via INTRAVENOUS
  Filled 2013-10-28 (×7): qty 1000

## 2013-10-28 MED ORDER — DIPHENHYDRAMINE HCL 12.5 MG/5ML PO ELIX: 12.5000 mg | ORAL_SOLUTION | Freq: Four times a day (QID) | ORAL | Status: DC | PRN

## 2013-10-28 MED ORDER — LIP MEDEX EX OINT
1.0000 "application " | TOPICAL_OINTMENT | Freq: Two times a day (BID) | CUTANEOUS | Status: DC
  Administered 2013-10-28 – 2013-11-07 (×17): 1 via TOPICAL
  Filled 2013-10-28 (×2): qty 7

## 2013-10-28 MED ORDER — SPOT INK MARKER SYRINGE KIT
PACK | SUBMUCOSAL | Status: AC
  Filled 2013-10-28: qty 10

## 2013-10-28 MED ORDER — ADULT MULTIVITAMIN W/MINERALS CH
1.0000 | ORAL_TABLET | Freq: Every day | ORAL | Status: DC
  Administered 2013-10-29 – 2013-10-30 (×2): 1 via ORAL
  Filled 2013-10-28 (×3): qty 1

## 2013-10-28 SURGICAL SUPPLY — 123 items
APPLIER CLIP 5 13 M/L LIGAMAX5 (MISCELLANEOUS)
APPLIER CLIP ROT 10 11.4 M/L (STAPLE)
APR CLP MED LRG 11.4X10 (STAPLE)
APR CLP MED LRG 5 ANG JAW (MISCELLANEOUS)
BAG URINE DRAINAGE (UROLOGICAL SUPPLIES) ×3 IMPLANT
BLADE EXTENDED COATED 6.5IN (ELECTRODE) ×3 IMPLANT
BLADE HEX COATED 2.75 (ELECTRODE) ×6 IMPLANT
BLADE SURG SZ10 CARB STEEL (BLADE) ×3 IMPLANT
BLADE SURG SZ11 CARB STEEL (BLADE) ×3 IMPLANT
CABLE HIGH FREQUENCY MONO STRZ (ELECTRODE) IMPLANT
CANISTER SUCTION 2500CC (MISCELLANEOUS) ×1 IMPLANT
CATH FOLEY 2WAY SLVR 18FR 30CC (CATHETERS) ×2 IMPLANT
CATH FOLEY 2WAY SLVR 30CC 24FR (CATHETERS) ×3 IMPLANT
CATH KIT ON Q 7.5IN SLV (PAIN MANAGEMENT) IMPLANT
CATH ROBINSON RED A/P 16FR (CATHETERS) IMPLANT
CELLS DAT CNTRL 66122 CELL SVR (MISCELLANEOUS) IMPLANT
CHLORAPREP W/TINT 26ML (MISCELLANEOUS) ×3 IMPLANT
CLIP APPLIE 5 13 M/L LIGAMAX5 (MISCELLANEOUS) IMPLANT
CLIP APPLIE ROT 10 11.4 M/L (STAPLE) IMPLANT
CLIP LIGATING HEM O LOK PURPLE (MISCELLANEOUS) ×3 IMPLANT
CLIP LIGATING HEMO O LOK GREEN (MISCELLANEOUS) IMPLANT
CLIP LIGATING HEMOLOK MED (MISCELLANEOUS) IMPLANT
COUNTER NEEDLE 20 DBL MAG RED (NEEDLE) ×2 IMPLANT
COVER MAYO STAND STRL (DRAPES) ×3 IMPLANT
COVER TIP SHEARS 8 DVNC (MISCELLANEOUS) ×1 IMPLANT
COVER TIP SHEARS 8MM DA VINCI (MISCELLANEOUS) ×4
DECANTER SPIKE VIAL GLASS SM (MISCELLANEOUS) ×6 IMPLANT
DEVICE TROCAR PUNCTURE CLOSURE (ENDOMECHANICALS) ×1 IMPLANT
DRAIN CHANNEL 19F RND (DRAIN) ×2 IMPLANT
DRAIN PENROSE 18X1/4 LTX STRL (WOUND CARE) ×2 IMPLANT
DRAPE ARM DVNC X/XI (DISPOSABLE) ×4 IMPLANT
DRAPE CAMERA CLOSED 9X96 (DRAPES) ×2 IMPLANT
DRAPE COLUMN DVNC XI (DISPOSABLE) ×1 IMPLANT
DRAPE DA VINCI XI ARM (DISPOSABLE) ×8
DRAPE DA VINCI XI COLUMN (DISPOSABLE) ×2
DRAPE LG THREE QUARTER DISP (DRAPES) ×3 IMPLANT
DRAPE SURG IRRIG POUCH 19X23 (DRAPES) ×3 IMPLANT
DRAPE UTILITY XL STRL (DRAPES) ×5 IMPLANT
DRAPE WARM FLUID 44X44 (DRAPE) ×4 IMPLANT
DRSG OPSITE POSTOP 4X10 (GAUZE/BANDAGES/DRESSINGS) IMPLANT
DRSG OPSITE POSTOP 4X6 (GAUZE/BANDAGES/DRESSINGS) IMPLANT
DRSG OPSITE POSTOP 4X8 (GAUZE/BANDAGES/DRESSINGS) IMPLANT
DRSG TEGADERM 2-3/8X2-3/4 SM (GAUZE/BANDAGES/DRESSINGS) ×2 IMPLANT
DRSG TEGADERM 4X4.75 (GAUZE/BANDAGES/DRESSINGS) ×2 IMPLANT
DRSG TEGADERM 6X8 (GAUZE/BANDAGES/DRESSINGS) ×1 IMPLANT
ELECT REM PT RETURN 9FT ADLT (ELECTROSURGICAL) ×3
ELECTRODE REM PT RTRN 9FT ADLT (ELECTROSURGICAL) ×1 IMPLANT
ENDOLOOP SUT PDS II  0 18 (SUTURE)
ENDOLOOP SUT PDS II 0 18 (SUTURE) IMPLANT
EVACUATOR SILICONE 100CC (DRAIN) ×2 IMPLANT
GAUZE SPONGE 4X4 12PLY STRL (GAUZE/BANDAGES/DRESSINGS) IMPLANT
GAUZE SPONGE 4X4 16PLY XRAY LF (GAUZE/BANDAGES/DRESSINGS) ×2 IMPLANT
GLOVE ECLIPSE 8.0 STRL XLNG CF (GLOVE) ×9 IMPLANT
GLOVE INDICATOR 8.0 STRL GRN (GLOVE) ×9 IMPLANT
GOWN STRL REUS W/TWL 2XL LVL3 (GOWN DISPOSABLE) ×3 IMPLANT
GOWN STRL REUS W/TWL XL LVL3 (GOWN DISPOSABLE) ×21 IMPLANT
HOLDER FOLEY CATH W/STRAP (MISCELLANEOUS) ×2 IMPLANT
KIT BASIN OR (CUSTOM PROCEDURE TRAY) ×6 IMPLANT
LEGGING LITHOTOMY PAIR STRL (DRAPES) ×3 IMPLANT
LUBRICANT JELLY K Y 4OZ (MISCELLANEOUS) ×3 IMPLANT
MANIFOLD NEPTUNE II (INSTRUMENTS) ×2 IMPLANT
NDL INSUFFLATION 14GA 120MM (NEEDLE) ×1 IMPLANT
NEEDLE HYPO 22GX1.5 SAFETY (NEEDLE) ×3 IMPLANT
NEEDLE INSUFFLATION 14GA 120MM (NEEDLE) ×3 IMPLANT
PACK CARDIOVASCULAR III (CUSTOM PROCEDURE TRAY) ×3 IMPLANT
PACK GENERAL/GYN (CUSTOM PROCEDURE TRAY) ×3 IMPLANT
PENCIL BUTTON HOLSTER BLD 10FT (ELECTRODE) ×3 IMPLANT
PUMP PAIN ON-Q (MISCELLANEOUS) IMPLANT
RETRACTOR LONE STAR DISPOSABLE (INSTRUMENTS) ×2 IMPLANT
RETRACTOR STAY HOOK 5MM (MISCELLANEOUS) ×2 IMPLANT
RETRACTOR WND ALEXIS 18 MED (MISCELLANEOUS) IMPLANT
RTRCTR WOUND ALEXIS 18CM MED (MISCELLANEOUS)
SCISSORS LAP 5X35 DISP (ENDOMECHANICALS) ×2 IMPLANT
SCRUB PCMX 4 OZ (MISCELLANEOUS) ×3 IMPLANT
SEAL CANN UNIV 5-8 DVNC XI (MISCELLANEOUS) ×4 IMPLANT
SEAL XI 5MM-8MM UNIVERSAL (MISCELLANEOUS) ×8
SEALER TISSUE G2 STRG ARTC 35C (ENDOMECHANICALS) IMPLANT
SET IRRIG TUBING LAPAROSCOPIC (IRRIGATION / IRRIGATOR) IMPLANT
SLEEVE XCEL OPT CAN 5 100 (ENDOMECHANICALS) ×2 IMPLANT
SOLUTION ELECTROLUBE (MISCELLANEOUS) ×3 IMPLANT
SPONGE LAP 18X18 X RAY DECT (DISPOSABLE) ×2 IMPLANT
STAPLER CANNULA SEAL DVNC XI (STAPLE) IMPLANT
STAPLER CANNULA SEAL XI (STAPLE) ×2
STAPLER CIRC ILS CVD 33MM 37CM (STAPLE) ×2 IMPLANT
STAPLER PROXIMATE 75MM BLUE (STAPLE) ×2 IMPLANT
STAPLER SHEATH (SHEATH) ×2
STAPLER SHEATH ENDOWRIST DVNC (SHEATH) IMPLANT
SUCTION POOLE TIP (SUCTIONS) ×3 IMPLANT
SUT MNCRL AB 4-0 PS2 18 (SUTURE) ×3 IMPLANT
SUT PDS AB 1 CTX 36 (SUTURE) ×2 IMPLANT
SUT PDS AB 1 TP1 96 (SUTURE) IMPLANT
SUT PROLENE 0 CT 2 (SUTURE) ×1 IMPLANT
SUT PROLENE 0 SH 30 (SUTURE) ×8 IMPLANT
SUT PROLENE 2 0 CT 1 (SUTURE) ×2 IMPLANT
SUT SILK 2 0 (SUTURE) ×3
SUT SILK 2 0 SH CR/8 (SUTURE) ×3 IMPLANT
SUT SILK 2-0 18XBRD TIE 12 (SUTURE) ×1 IMPLANT
SUT SILK 3 0 (SUTURE) ×3
SUT SILK 3 0 SH CR/8 (SUTURE) ×3 IMPLANT
SUT SILK 3-0 18XBRD TIE 12 (SUTURE) ×1 IMPLANT
SUT VIC AB 2-0 SH 27 (SUTURE) ×3
SUT VIC AB 2-0 SH 27X BRD (SUTURE) IMPLANT
SUT VIC AB 3-0 SH 18 (SUTURE) ×2 IMPLANT
SUT VICRYL 0 TIES 12 18 (SUTURE) ×3 IMPLANT
SUT VICRYL 0 UR6 27IN ABS (SUTURE) ×3 IMPLANT
SUT VLOC BARB 180 ABS3/0GR12 (SUTURE) ×12
SUTURE VLOC BRB 180 ABS3/0GR12 (SUTURE) IMPLANT
SYR BULB IRRIGATION 50ML (SYRINGE) ×2 IMPLANT
SYRINGE 12CC LL (MISCELLANEOUS) ×1 IMPLANT
SYRINGE 20CC LL (MISCELLANEOUS) ×9 IMPLANT
SYS LAPSCP GELPORT 120MM (MISCELLANEOUS)
SYSTEM LAPSCP GELPORT 120MM (MISCELLANEOUS) IMPLANT
TAPE UMBILICAL COTTON 1/8X30 (MISCELLANEOUS) ×3 IMPLANT
TOWEL OR 17X26 10 PK STRL BLUE (TOWEL DISPOSABLE) ×6 IMPLANT
TOWEL OR NON WOVEN STRL DISP B (DISPOSABLE) ×6 IMPLANT
TRAY FOLEY CATH 14FRSI W/METER (CATHETERS) ×1 IMPLANT
TRAY FOLEY CATH 16FRSI W/METER (SET/KITS/TRAYS/PACK) ×2 IMPLANT
TROCAR BLADELESS OPT 5 100 (ENDOMECHANICALS) ×3 IMPLANT
TUBING CONNECTING 10 (TUBING) IMPLANT
TUBING CONNECTING 10' (TUBING)
TUBING FILTER THERMOFLATOR (ELECTROSURGICAL) ×3 IMPLANT
TUNNELER SHEATH ON-Q 16GX12 DP (PAIN MANAGEMENT) IMPLANT
YANKAUER SUCT BULB TIP 10FT TU (MISCELLANEOUS) ×3 IMPLANT

## 2013-10-28 NOTE — Transfer of Care (Signed)
Immediate Anesthesia Transfer of Care Note  Patient: Richard [name]  Procedure(s) Performed: Procedure(s): MINIMALLY  INVASIVE ROBOT /LAPAROSCOPIC  REMOVAL OF PARTIAL COLON AND RECTUM LOW ANTERIOR RESECTION DIVERTING LOOP ILEOSTOMY RIGID PROTOSCOPY  (N/A)  Patient Location: PACU  Anesthesia Type:General  Level of Consciousness: awake, sedated and patient cooperative  Airway & Oxygen Therapy: Patient Spontanous Breathing and Patient connected to face mask oxygen  Post-op Assessment: Report given to PACU RN and Post -op Vital signs reviewed and stable  Post vital signs: Reviewed and stable  Complications: No apparent anesthesia complications

## 2013-10-28 NOTE — Discharge Instructions (Signed)
ABDOMINAL SURGERY: POST OP INSTRUCTIONS ° °1. DIET: Follow a light bland diet the first 24 hours after arrival home, such as soup, liquids, crackers, etc.  Be sure to include lots of fluids daily.  Avoid fast food or heavy meals as your are more likely to get nauseated.  Eat a low fat the next few days after surgery.   °2. Take your usually prescribed home medications unless otherwise directed. °3. PAIN CONTROL: °a. Pain is best controlled by a usual combination of three different methods TOGETHER: °i. Ice/Heat °ii. Over the counter pain medication °iii. Prescription pain medication °b. Most patients will experience some swelling and bruising around the incisions.  Ice packs or heating pads (30-60 minutes up to 6 times a day) will help. Use ice for the first few days to help decrease swelling and bruising, then switch to heat to help relax tight/sore spots and speed recovery.  Some people prefer to use ice alone, heat alone, alternating between ice & heat.  Experiment to what works for you.  Swelling and bruising can take several weeks to resolve.   °c. It is helpful to take an over-the-counter pain medication regularly for the first few weeks.  Choose one of the following that works best for you: °i. Naproxen (Aleve, etc)  Two 220mg tabs twice a day °ii. Ibuprofen (Advil, etc) Three 200mg tabs four times a day (every meal & bedtime) °iii. Acetaminophen (Tylenol, etc) 500-650mg four times a day (every meal & bedtime) °d. A  prescription for pain medication (such as oxycodone, hydrocodone, etc) should be given to you upon discharge.  Take your pain medication as prescribed.  °i. If you are having problems/concerns with the prescription medicine (does not control pain, nausea, vomiting, rash, itching, etc), please call us [phone] to see if we need to switch you to a different pain medicine that will work better for you and/or control your side effect better. °ii. If you need a refill on your pain medication,  please contact your pharmacy.  They will contact our office to request authorization. Prescriptions will not be filled after 5 pm or on week-ends. °4. Avoid getting constipated.  Between the surgery and the pain medications, it is common to experience some constipation.  Increasing fluid intake and taking a fiber supplement (such as Metamucil, Citrucel, FiberCon, MiraLax, etc) 1-2 times a day regularly will usually help prevent this problem from occurring.  A mild laxative (prune juice, Milk of Magnesia, MiraLax, etc) should be taken according to package directions if there are no bowel movements after 48 hours.   °5. Watch out for diarrhea.  If you have many loose bowel movements, simplify your diet to bland foods & liquids for a few days.  Stop any stool softeners and decrease your fiber supplement.  Switching to mild anti-diarrheal medications (Kayopectate, Pepto Bismol) can help.  If this worsens or does not improve, please call us. °6. Wash / shower every day.  You may shower over the incision / wound.  Avoid baths until the skin is fully healed.  Continue to shower over incision(s) after the dressing is off. °7. Remove your waterproof bandages 5 days after surgery.  You may leave the incision open to air.  You may replace a dressing/Band-Aid to cover the incision for comfort if you wish. °8. ACTIVITIES as tolerated:   °a. You may resume regular (light) daily activities beginning the next day--such as daily self-care, walking, climbing stairs--gradually increasing activities as tolerated.  If you can   walk 30 minutes without difficulty, it is safe to try more intense activity such as jogging, treadmill, bicycling, low-impact aerobics, swimming, etc. b. Save the most intensive and strenuous activity for last such as sit-ups, heavy lifting, contact sports, etc  Refrain from any heavy lifting or straining until you are off narcotics for pain control.   c. DO NOT PUSH THROUGH PAIN.  Let pain be your guide: If it  hurts to do something, don't do it.  Pain is your body warning you to avoid that activity for another week until the pain goes down. d. You may drive when you are no longer taking prescription pain medication, you can comfortably wear a seatbelt, and you can safely maneuver your car and apply brakes. e. Dennis Bast may have sexual intercourse when it is comfortable.  9. FOLLOW UP in our office a. Please call [facility] at [phone] to set up an appointment to see your surgeon in the office for a follow-up appointment approximately 1-2 weeks after your surgery. b. Make sure that you call for this appointment the day you arrive home to insure a convenient appointment time. 10. IF YOU HAVE DISABILITY OR FAMILY LEAVE FORMS, BRING THEM TO THE OFFICE FOR PROCESSING.  DO NOT GIVE THEM TO YOUR DOCTOR.   WHEN TO CALL us [phone]: 1. Poor pain control 2. Reactions / problems with new medications (rash/itching, nausea, etc)  3. Fever over 101.5 F (38.5 C) 4. Inability to urinate 5. Nausea and/or vomiting 6. Worsening swelling or bruising 7. Continued bleeding from incision. 8. Increased pain, redness, or drainage from the incision  The clinic staff is available to answer your questions during regular business hours (8:30am-5pm).  Please dont hesitate to call and ask to speak to one of our nurses for clinical concerns.   A surgeon from [facility] is always on call at the hospitals   If you have a medical emergency, go to the nearest emergency room or call 911.    [facility], [place], [place], [place], [place]  [address] ? MAIN: [phone] ? TOLL FREE: [phone] ? FAX [phone] [email or web address]   Ostomy Support Information  Yes, Theresia Majors heard that people get along just fine with only one of their eyes, or one of their lungs, or one of their kidneys. But you also know that you have only one intestine and only one bladder, and that  leaves you feeling awfully empty, both physically and emotionally: You think no other people go around without part of their intestine with the ends of their intestines sticking out through their abdominal walls.  Well, you are wrong! There are nearly three quarters of a million people in the [place] who have an ostomy; people who have had surgery to remove all or part of their colons or bladders. There is even a national association, the [place] [employer] [place] with over 350 local affiliated support groups that are organized by volunteers who provide peer support and counseling. Juan Quam has a toll free telephone num-ber, ([phone] and an educational,  interactive website, www.ostomy.org   An ostomy is an opening in the belly (abdominal wall) made by surgery. Ostomates are people who have had this procedure. The opening (stoma) allows the kidney or bowel to discharge waste. An external pouch covers the stoma to collect waste. Pouches are are a simple bag and are odor free. Different companies have disposable or reusable pouches to fit one's lifestyle. An ostomy can either  be temporary or permanent.  THERE ARE THREE MAIN TYPES OF OSTOMIES  Colostomy. A colostomy is a surgically created opening in the large intestine (colon).  Ileostomy. An ileostomy is a surgically created opening in the small intestine.  Urostomy. A urostomy is a surgically created opening to divert urine away from the bladder. FREQUENTLY ASKED QUESTIONS   Why havent you met any of these folks who have an ostomy?  Well, maybe you have! You just did not recognize them because an ostomy doesn't show. It can be kept secret if you wish. Why, maybe some of your best friends, office associates or neighbors have an ostomy ... you never can tell.   People facing ostomy surgery have many quality-of-life questions like:  Will you bulge? Smell? Make noises? Will you feel waste leaving your body? Will you be a captive of the toilet?  Will you starve? Be a social outcast? Get/stay married? Have babies? Easily bathe, go swimming, bend over?  OK, lets look at what you can expect:  Will you bulge?  Remember, without part of the intestine or bladder, and its contents, you should have a flatter tummy than before. You can expect to wear, with little exception, what you wore before surgery ... and this in-cludes tight clothing and bathing suits.  Will you smell?  Today, thanks to modern odor proof pouching systems, you can walk into an ostomy support group meeting and not smell anything that is foul or offensive. And, for those with an ileostomy or colostomy who are concerned about odor when emptying their pouch, there are in-pouch deodorants that can be used to eliminate any waste odors that may exist.  Will you make noises?  Everyone produces gas, especially if they are an air-swallower. But intestinal sounds that occur from time to time are no differ-ent than a gurgling tummy, and quite often your clothing will muffle any sounds.   Will you feel the waste discharges?  For those with a colostomy or ileostomy there might be a slight pressure when waste leaves your body, but understand that the intestines have no nerve endings, so there will be no unpleasant sensations. Those with a urostomy will probably be unaware of any kidney drainage.  Will you be a captive of the toilet?  Immediately post-op you will spend more time in the bathroom than you will after your body recovers from surgery. Every person is different, but on average those with an ileostomy or urostomy may empty their pouches 4 to 6 times a day; a little  less if you have a colostomy. The average wear time between pouch system changes is 3 to 5 days and the changing process should take less than 30 minutes.  Will I need to be on a special diet? Most people return to their normal diet when they have recovered from surgery. Be sure to chew your food well, eat a well-balanced  diet and drink plenty of fluids. If you experience problems with a certain food, wait a couple of weeks and try it again. Will there be odor and noises? Pouching systems are designed to be odor-proof or odor-resistant. There are deodorants that can be used in the pouch. Medications are also available to help reduce odor. Limit gas-producing foods and carbonated beverages. You will experience less gas and fewer noises as you heal from surgery. How much time will it take to care for my ostomy? At first, you may spend a lot of time learning about your ostomy and how to  take care of it. As you become more comfortable and skilled at changing the pouching system, it will take very little time to care for it.  Will I be able to return to work? People with ostomies can perform most jobs. As soon as you have healed from surgery, you should be able to return to work. Heavy lifting (more than 10 pounds) may be discouraged.  What about intimacy? Sexual relationships and intimacy are important and fulfilling aspects of your life. They should continue after ostomy surgery. Intimacy-related concerns should be discussed openly between you and your partner.  Can I wear regular clothing? You do not need to wear special clothing. Ostomy pouches are fairly flat and barely noticeable. Elastic undergarments will not hurt the stoma or prevent the ostomy from functioning.  Can I participate in sports? An ostomy should not limit your involvement in sports. Many people with ostomies are runners, skiers, swimmers or participate in other active lifestyles. Talk with your caregiver first before doing heavy physical activity.  Will you starve?  Not if you follow doctors orders at each stage of your post-op adjustment. There is no such thing as an ostomy diet. Some people with an ostomy will be able to eat and tolerate anything; others may find diffi-culty with some foods. Each person is an individual and must determine, by  trial, what is best for them. A good practice for all is to drink plenty of water.  Will you be a social outcast?  Have you met anyone who has an ostomy and is a social outcast? Why should you be the first? Only your attitude and self image will effect how you are treated. No confi-dent person is an Occupational psychologist.   PROFESSIONAL HELP  Resources are available if you need help or have questions about your ostomy.    Specially trained nurses called Wound, Ostomy Continence Nurses (WOCN) are available for consultation in most major medical centers.   Consider getting an ostomy consult with Cena Benton at [facility] to help troubleshoot stoma pouch fittings and other issues with your ostomy: [phone]   The [employer] Ostomy Association (UOA) is a group made up of many local chapters throughout the [place]. These local groups hold meetings and provide support to prospective and existing ostomates. They sponsor educational events and have qualified visitors to make personal or telephone visits. Contact the UOA for the chapter nearest you and for other educational publications.  More detailed information can be found in Colostomy Guide, a publication of the [employer] (UOA). Contact UOA at 1-(219) [phone] or visit their web site at [email or web address]/. The website contains links to other sites, suppliers and resources. Document Released: 04/06/2003 Document Revised: 06/26/2011 Document Reviewed: 08/05/2008 [facility] Patient Information 2013 [place].  GETTING TO GOOD BOWEL HEALTH. Irregular bowel habits such as constipation and diarrhea can lead to many problems over time.  Having one soft bowel movement a day is the most important way to prevent further problems.  The anorectal canal is designed to handle stretching and feces to safely manage our ability to get rid of solid waste (feces, poop, stool) out of our body.  BUT, hard constipated stools can act like ripping concrete  bricks and diarrhea can be a burning fire to this very sensitive area of our body, causing inflamed hemorrhoids, anal fissures, increasing risk is perirectal abscesses, abdominal pain/bloating, an making irritable bowel worse.     The goal: ONE SOFT BOWEL MOVEMENT A DAY!  To have soft,  regular bowel movements:    Drink at least 8 tall glasses of water a day.     Take plenty of fiber.  Fiber is the undigested part of plant food that passes into the colon, acting s natures broom to encourage bowel motility and movement.  Fiber can absorb and hold large amounts of water. This results in a larger, bulkier stool, which is soft and easier to pass. Work gradually over several weeks up to 6 servings a day of fiber (25g a day even more if needed) in the form of: o Vegetables -- Root (potatoes, carrots, turnips), leafy green (lettuce, salad greens, celery, spinach), or cooked high residue (cabbage, broccoli, etc) o Fruit -- Fresh (unpeeled skin & pulp), Dried (prunes, apricots, cherries, etc ),  or stewed ( applesauce)  o Whole grain breads, pasta, etc (whole wheat)  o Bran cereals    Bulking Agents -- This type of water-retaining fiber generally is easily obtained each day by one of the following:  o Psyllium bran -- The psyllium plant is remarkable because its ground seeds can retain so much water. This product is available as Metamucil, Konsyl, Effersyllium, Per Diem Fiber, or the less expensive generic preparation in drug and health food stores. Although labeled a laxative, it really is not a laxative.  o Methylcellulose -- This is another fiber derived from wood which also retains water. It is available as Citrucel. o Polyethylene Glycol - and artificial fiber commonly called Miralax or Glycolax.  It is helpful for people with gassy or bloated feelings with regular fiber o Flax Seed - a less gassy fiber than psyllium   No reading or other relaxing activity while on the toilet. If bowel movements take  longer than 5 minutes, you are too constipated   AVOID CONSTIPATION.  High fiber and water intake usually takes care of this.  Sometimes a laxative is needed to stimulate more frequent bowel movements, but    Laxatives are not a good long-term solution as it can wear the colon out. o Osmotics (Milk of Magnesia, Fleets phosphosoda, Magnesium citrate, MiraLax, GoLytely) are safer than  o Stimulants (Senokot, Castor Oil, Dulcolax, Ex Lax)    o Do not take laxatives for more than 7days in a row.    IF SEVERELY CONSTIPATED, try a Bowel Retraining Program: o Do not use laxatives.  o Eat a diet high in roughage, such as bran cereals and leafy vegetables.  o Drink six (6) ounces of prune or apricot juice each morning.  o Eat two (2) large servings of stewed fruit each day.  o Take one (1) heaping tablespoon of a psyllium-based bulking agent twice a day. Use sugar-free sweetener when possible to avoid excessive calories.  o Eat a normal breakfast.  o Set aside 15 minutes after breakfast to sit on the toilet, but do not strain to have a bowel movement.  o If you do not have a bowel movement by the third day, use an enema and repeat the above steps.    Controlling diarrhea o Switch to liquids and simpler foods for a few days to avoid stressing your intestines further. o Avoid dairy products (especially milk & ice cream) for a short time.  The intestines often can lose the ability to digest lactose when stressed. o Avoid foods that cause gassiness or bloating.  Typical foods include beans and other legumes, cabbage, broccoli, and dairy foods.  Every person has some sensitivity to other foods, so listen to our body  and avoid those foods that trigger problems for you. o Adding fiber (Citrucel, Metamucil, psyllium, Miralax) gradually can help thicken stools by absorbing excess fluid and retrain the intestines to act more normally.  Slowly increase the dose over a few weeks.  Too much fiber too soon can backfire  and cause cramping & bloating. o Probiotics (such as active yogurt, Align, etc) may help repopulate the intestines and colon with normal bacteria and calm down a sensitive digestive tract.  Most studies show it to be of mild help, though, and such products can be costly. o Medicines:   Bismuth subsalicylate (ex. Kayopectate, Pepto Bismol) every 30 minutes for up to 6 doses can help control diarrhea.  Avoid if pregnant.   Loperamide (Immodium) can slow down diarrhea.  Start with two tablets (58m total) first and then try one tablet every 6 hours.  Avoid if you are having fevers or severe pain.  If you are not better or start feeling worse, stop all medicines and call your doctor for advice o Call your doctor if you are getting worse or not better.  Sometimes further testing (cultures, endoscopy, X-ray studies, bloodwork, etc) may be needed to help diagnose and treat the cause of the diarrhea.  Managing Pain  Pain after surgery or related to activity is often due to strain/injury to muscle, tendon, nerves and/or incisions.  This pain is usually short-term and will improve in a few months.   Many people find it helpful to do the following things TOGETHER to help speed the process of healing and to get back to regular activity more quickly:  1. Avoid heavy physical activity a.  no lifting greater than 20 pounds b. Do not push through the pain.  Listen to your body and avoid positions and maneuvers than reproduce the pain c. Walking is okay as tolerated, but go slowly and stop when getting sore.  d. Remember: If it hurts to do it, then dont do it! 2. Take Anti-inflammatory medication  a. Take with food/snack around the clock for 1-2 weeks i. This helps the muscle and nerve tissues become less irritable and calm down faster ii. Choose Acetaminophen 5033mtabs (Tylenol) 1-2 pills with every meal and just before bedtime 3. Use a Heating pad or Ice/Cold Pack a. 4-6 times a day b. May use warm  bath/hottub  or showers 4. Try Gentle Massage and/or Stretching  a. at the area of pain many times a day b. stop if you feel pain - do not overdo it  Try these steps together to help you body heal faster and avoid making things get worse.  Doing just one of these things may not be enough.    If you are not getting better after two weeks or are noticing you are getting worse, contact our office for further advice; we may need to re-evaluate you & see what other things we can do to help.

## 2013-10-28 NOTE — H&P (Signed)
[address] [place], [place] [address] Phone: [phone] FAX: [phone]     Richard [name]  19-Jun-1934 [id number]  CARE TEAM:  PCP: Leonard Downing, MD  Outpatient Care Team: Patient Care Team: Leonard Downing, MD as PCP - General (Family Medicine) Adin Hector, MD as Consulting Physician (General Surgery) Wonda Horner, MD as Consulting Physician (Gastroenterology) Linna Hoff, MD as Consulting Physician (Orthopedic Surgery) Hayden Pedro, MD as Consulting Physician (Ophthalmology) Carola Frost, RN as Registered Nurse (Oncology) Marye Round, MD as Consulting Physician (Radiation Oncology) Ladell Pier, MD as Consulting Physician (Oncology)  Inpatient Treatment Team: Treatment Team: Attending Provider: Adin Hector, MD  This patient is a 79 y.o.male who presents today for surgical evaluation.   Reason for visit: Rectal cancer followup   Pleasant gentleman. Comes today with his wife. Both heavy smokers. Found to have bulky rectal cancer. Seen by the multidisciplinary group. This completed neoadjuvant chemoradiation therapy. Skin ulceration on his tailbone but that is improving with the help of Neosporin and gentle care by his wife. No more bleeding. Mild mucus anal drainage but not severe. No fevers or chills. Eating okay. Working out in the garden and staying active. Completed chemotherapy 9 weeks ago. Gradually recovering.   Past Medical History  Diagnosis Date  . Rectal adenocarcinoma (7cm posterior) 06/16/2013  . Kyphoscoliosis deformity of spine   . Colon cancer 05/29/13    invasive adenocarcinoma  . History of radiation therapy 07/14/13-08/20/13    rectal, 50.4Gy/  . Psoriasis     SCALP  . Hemorrhoids   . History of transfusion     Past Surgical History  Procedure Laterality Date  . I&d extremity  06/07/2011    Procedure: IRRIGATION AND DEBRIDEMENT EXTREMITY;  Surgeon:  Linna Hoff, MD;  Location: [facility];  Service: Orthopedics;  Laterality: Left;  . Amputation  06/07/2011    Procedure: AMPUTATION DIGIT;  Surgeon: Linna Hoff, MD;  Location: [facility];  Service: Orthopedics;  Laterality: Left;  Revision multiple fingers index and middle   . Eus N/A 06/25/2013    Procedure: LOWER ENDOSCOPIC ULTRASOUND (EUS);  Surgeon: Arta Silence, MD;  Location: [facility];  Service: Endoscopy;  Laterality: N/A;  . Colonoscopy w/ biopsies  05/29/13  . Eus  06/25/13    History   Social History  . Marital Status: Married    Spouse Name: N/A    Number of Children: N/A  . Years of Education: N/A   Occupational History  . Not on file.   Social History Main Topics  . Smoking status: Current Every Day Smoker -- 1.00 packs/day for 64 years    Types: Cigarettes  . Smokeless tobacco: Not on file     Comment: 07/16/13 currently 1/2 ppd  . Alcohol Use: 3.0 oz/week    5 Cans of beer per week  . Drug Use: No  . Sexual Activity: Not Currently   Other Topics Concern  . Not on file   Social History Narrative  . No narrative on file    Family History  Problem Relation Age of Onset  . Lung cancer Father   . Kidney cancer Brother   . Cancer - Other Mother     "upper stomach"  . Lung cancer Sister     Current Facility-Administered Medications  Medication Dose Route Frequency Provider Last Rate Last Dose  . bupivacaine 0.25 % ON-Q pump DUAL CATH 300  mL  300 mL Other Continuous Adin Hector, MD      . cefoTEtan (CEFOTAN) 2 g in dextrose 5 % 50 mL IVPB  2 g Intravenous On Call to OR Adin Hector, MD      . clindamycin (CLEOCIN) 900 mg, gentamicin (GARAMYCIN) 240 mg in sodium chloride 0.9 % 1,000 mL for intraperitoneal lavage   Intraperitoneal To OR Adin Hector, MD       Facility-Administered Medications Ordered in Other Encounters  Medication Dose Route Frequency Provider Last Rate Last Dose  . lactated ringers infusion    Continuous PRN Dione Booze, CRNA          No Known Allergies  ROS: Constitutional:  No fevers, chills, sweats.  Weight stable Eyes:  No vision changes, No discharge HENT:  No sore throats, nasal drainage Lymph: No neck swelling, No bruising easily Pulmonary:  No cough, productive sputum CV: No orthopnea, PND  No exertional chest/neck/shoulder/arm pain. GI: No personal nor family history of inflammatory bowel disease, irritable bowel syndrome, allergy such as Celiac Sprue, dietary/dairy problems, colitis, ulcers nor gastritis.  No recent sick contacts/gastroenteritis.  No travel outside the country.  No changes in diet. Renal: No UTIs, No hematuria Genital:  No drainage, bleeding, masses Musculoskeletal: No severe joint pain.  Good ROM major joints Skin:  No sores or lesions.  No rashes Heme/Lymph:  No easy bleeding.  No swollen lymph nodes Neuro: No focal weakness/numbness.  No seizures Psych: No suicidal ideation.  No hallucinations  BP 143/74  Pulse 72  Temp(Src) 97.4 F (36.3 C) (Oral)  Resp 18  Ht 5' 11.5" (1.816 m)  Wt 158 lb (71.668 kg)  BMI 21.73 kg/m2  SpO2 98%  Physical Exam: General: Pt awake/alert/oriented x4 in no major acute distress Eyes: PERRL, normal EOM. Sclera nonicteric Neuro: CN II-XII intact w/o focal sensory/motor deficits. Lymph: No head/neck/groin lymphadenopathy Psych:  No delerium/psychosis/paranoia HENT: Normocephalic, Mucus membranes moist.  No thrush Neck: Supple, No tracheal deviation Chest: No pain.  Good respiratory excursion. CV:  Pulses intact.  Regular rhythm Abdomen: Soft, Nondistended.  Nontender.  No incarcerated hernias. Ext:  SCDs BLE.  No significant edema.  No cyanosis Skin: No petechiae / purpurea.  No major sores Musculoskeletal: No severe joint pain.  Good ROM major joints.  Kyphosis unchanged   Results:   Labs: Results for orders placed during the hospital encounter of 10/26/2013 (from the past 48 hour(s))  TYPE AND SCREEN     Status: None   Collection  Time    11/08/2013  5:49 AM      Result Value Ref Range   ABO/RH(D) A NEG     Antibody Screen NEG     Sample Expiration 10/31/2013      Imaging / Studies: No results found.  Medications / Allergies: per chart  Antibiotics: Anti-infectives   Start     Dose/Rate Route Frequency Ordered Stop   10/22/2013 0545  clindamycin (CLEOCIN) 900 mg, gentamicin (GARAMYCIN) 240 mg in sodium chloride 0.9 % 1,000 mL for intraperitoneal lavage    Comments:  Pharmacy may adjust dosing strength, schedule, rate of infusion, etc as needed to optimize therapy    Intraperitoneal To Surgery 10/24/2013 0536 10/29/13 0545   10/21/2013 0536  cefoTEtan (CEFOTAN) 2 g in dextrose 5 % 50 mL IVPB     2 g 100 mL/hr over 30 Minutes Intravenous On call to O.R. 10/31/2013 0536 10/29/13 0559      Assessment  Richard [name]  79 y.o. male  Day of Surgery  Procedure(s): MINIMALLY  INVASIVE ROBOT /LAPAROSCOPIC POSSIBLE OPEN REMOVAL OF PARTIAL COLON AND RECTUM LOW ANTERIOR RESECTION DIVERTING LOOP ILEOSTOMY RIGID PROTOSCOPY   Problem List:  Active Problems:   * No active hospital problems. *   Bulky mid to proximal rectal cancer in elderly smoking male s/p completion of neoadjuvant chemoXRT 08/21/2013  Plan:  Plan surgical segmental LAR resection. Perform minimally invasive (robotic/laparoscopic).  Diverting loop ileostomy to protect the anastomosis as it will be within 5 cm of the anal verge to get good margins & smoker. Preop ileostomy marking ordered & stoma training box given:   The anatomy & physiology of the digestive tract was discussed. The pathophysiology was discussed. Natural history risks without surgery was discussed. I worked to give an overview of the disease and the frequent need to have multispecialty involvement. I feel the risks of no intervention will lead to serious problems that outweigh the operative risks; therefore, I recommended a partial proctocolectomy to remove the pathology. Minimally Invasive  (Robotic/Laparoscopic) & open techniques were discussed. We will work to preserve anal & pelvic floor function without sacrificing cure.  Risks such as bleeding, infection, abscess, leak, reoperation, possible ostomy, hernia, heart attack, death, and other risks were discussed. I noted a good likelihood this will help address the problem. Goals of post-operative recovery were discussed as well. We will work to minimize complications. An educational handout on the pathology was given as well. Questions were answered.  The patient & his wife express understanding & wishes to proceed with surgery.   AGAIN, I strongly encouraged him to quit smoking. He has cut back "some." We talked to the patient about the dangers of smoking. We stressed that tobacco use dramatically increases the risk of peri-operative complications such as infection, tissue necrosis leaving to problems with incision/wound and organ healing, hernia, chronic pain, heart attack, stroke, DVT, pulmonary embolism, and death. We noted there are programs in our community to help stop smoking. Information was available.    -VTE prophylaxis- SCDs, etc -mobilize as tolerated to help recovery    Adin Hector, M.D., F.A.C.S. Gastrointestinal and Minimally Invasive Surgery [facility] [address] [place], [place] [address] [phone] Main / Paging   10/20/2013  Note: Portions of this report may have been transcribed using voice recognition software. Every effort was made to ensure accuracy; however, inadvertent computerized transcription errors may be present.   Any transcriptional errors that result from this process are unintentional.

## 2013-10-28 NOTE — Anesthesia Preprocedure Evaluation (Signed)
Anesthesia Evaluation  Patient identified by MRN, date of birth, ID band Patient awake    Reviewed: Allergy & Precautions, H&P , NPO status , Patient's Chart, lab work & pertinent test results  Airway Mallampati: I TM Distance: >3 FB Neck ROM: full    Dental  (+) Edentulous Upper, Edentulous Lower   Pulmonary COPDCurrent Smoker,          Cardiovascular negative cardio ROS      Neuro/Psych negative neurological ROS  negative psych ROS   GI/Hepatic negative GI ROS, Neg liver ROS,   Endo/Other  negative endocrine ROS  Renal/GU negative Renal ROS     Musculoskeletal negative musculoskeletal ROS (+)   Abdominal   Peds  Hematology negative hematology ROS (+)   Anesthesia Other Findings   Reproductive/Obstetrics                           Anesthesia Physical  Anesthesia Plan  ASA: II  Anesthesia Plan: General   Post-op Pain Management:    Induction: Intravenous  Airway Management Planned: Oral ETT  Additional Equipment:   Intra-op Plan:   Post-operative Plan: Extubation in OR  Informed Consent: I have reviewed the patients History and Physical, chart, labs and discussed the procedure including the risks, benefits and alternatives for the proposed anesthesia with the patient or authorized representative who has indicated his/her understanding and acceptance.   Dental advisory given  Plan Discussed with: CRNA  Anesthesia Plan Comments:         Anesthesia Quick Evaluation

## 2013-10-28 NOTE — Op Note (Addendum)
11/08/2013  2:11 PM  PATIENT:  Richard [name]  79 y.o. male  Patient Care Team: Leonard Downing, MD as PCP - General (Family Medicine) Adin Hector, MD as Consulting Physician (General Surgery) Wonda Horner, MD as Consulting Physician (Gastroenterology) Linna Hoff, MD as Consulting Physician (Orthopedic Surgery) Hayden Pedro, MD as Consulting Physician (Ophthalmology) Carola Frost, RN as Registered Nurse (Oncology) Marye Round, MD as Consulting Physician (Radiation Oncology) Ladell Pier, MD as Consulting Physician (Oncology)  PRE-OPERATIVE DIAGNOSIS:  MID LOW RECTAL CANCER   POST-OPERATIVE DIAGNOSIS:  MID/LOW RECTAL CANCER   PROCEDURE:  Procedure(s): MINIMALLY  INVASIVE ROBOTIC LOW ANTERIOR RESECTION  DIVERTING LOOP ILEOSTOMY  RIGID PROCTOSCOPY BLADDER REPAIR Excision of seborrheic keratosis   SURGEON:  Surgeon(s): Adin Hector, MD Ralene Ok, MD Asst  ANESTHESIA:   local and general  EBL:  Total I/O In: 5361 [I.V.:4200] Out: 1050 [Urine:350; Blood:700]  Delay start of Pharmacological VTE agent (>24hrs) due to surgical blood loss or risk of bleeding:  no  DRAINS: (19Fr) Blake drain(s) in the pelvis   SPECIMEN:  Source of Specimen:    Rectosigmoid (open end distal) Distal anastomotic ring (final margin)  DISPOSITION OF SPECIMEN:  PATHOLOGY  COUNTS:  YES  PLAN OF CARE: Admit to inpatient   PATIENT DISPOSITION:  PACU - guarded condition.  INDICATION:    Patient with rectal; cancer.   I recommended segmental resection.  S/p neoadjuvant chemoradiation Tx  The anatomy & physiology of the digestive tract was discussed.  The pathophysiology was discussed.  Natural history risks without surgery was discussed.   I worked to give an overview of the disease and the frequent need to have multispecialty involvement.  I feel the risks of no intervention will lead to serious problems that outweigh the operative risks; therefore, I recommended  a partial proctocolectomy to remove the pathology.  Minimally Invasive (Robotic/Laparoscopic) & open techniques were discussed.  We will work to preserve anal & pelvic floor function without sacrificing cure.  Risks such as bleeding, infection, abscess, leak, reoperation, possible ostomy, hernia, heart attack, death, and other risks were discussed.  I noted a good likelihood this will help address the problem.   Goals of post-operative recovery were discussed as well.  We will work to minimize complications.  An educational handout on the pathology was given as well.  Questions were answered.    The patient expresses understanding & wishes to proceed with surgery.    The patient expressed understanding & wished to proceed with surgery.  OR FINDINGS:   Patient had bulky scarred circumferential tumor in mid rectum.  No obvious metastatic disease on visceral parietal peritoneum or liver.  The anastomosis rests 3-4 cm from the anal verge by rigid proctoscopy.  Bladder repaired at proximal dome x 2 layers (V-lock)  DESCRIPTION:   Informed consent was confirmed.  The patient underwent general anaesthesia without difficulty.  The patient was positioned appropriately.  VTE prevention in place.  Surgical timeout confirmed our plan.  I did rigid proctoscopy confirmed to the circumferential scar 7 cm from the anal verge.  I tattooed anteriorly and posteriorly using any ink spot.  I placed a Foley balloon to help decompress the rectum.  The patient's abdomen was clipped, prepped, & draped in a sterile fashion.  Surgical timeout confirmed our plan.  The patient was positioned in reverse Trendelenburg.  I placed a Varess needle insufflated the abdomen to 15 mm.  Abdominal entry was gained using optical  entry technique in the subcostal [name] upper abdomen.  Entry was clean.  I induced carbon dioxide insufflation.  Camera inspection revealed no injury.  Extra ports were carefully placed under direct  laparoscopic visualization.  The greater omentum was artery up in the left upper quadrant.  I reflected the small bowel in the upper abdomen.  The patient was carefully positioned.  The Intuitive daVinci robot was carefully docked with camera & instruments carefully placed.  The patient had a very redundant sigmoid colon.  I scored the base of peritoneum of the medial side of the mesentery of the left colon from the ligament of Treitz to the peritoneal reflection of the mid rectum.   I elevated the sigmoid mesentery and entered into the retro-mesenteric plane. We were able to identify the left ureter and gonadal vessels. We kept those posterior within the retroperitoneum and elevated the left colon mesentery off that. I did isolated IMA pedicle but did not ligate it yet.  I continued distally and got into the avascular plane posterior to the proximal mesorectum. This allowed me to help mobilize the rectum as well by freeing the mesorectum off the sacrum.  We continued medial to lateral and then lateral to medial dissection to mobilize the entire left colon up to the splenic flexure.  Also freed some greater omentum to help releasing some help at fall down as well.  I skeletonized the lymph nodes off the inferior mesenteric artery pedicle.  I went down to its takeoff from the aorta.  We ensured hemostasis.  I freed the rectum off the sigmoid colon and unfolded.  This allowed [place] to place the rectum under more axial tension.  I then proceeded with mesorectal dissection.  We placed the rectosgimoid on axial tension & elevated cephalad.  I then scored the mesorectal sleeve off the sacrum and nerves.  We came around the lateral pedicles.  I scored the peritoneum along the [name] and left pelvis.  He had a very low and undermining peritoneal reflection. Eventually we came around that.  I alternated between posterior lateral and anterior dissection of the rectum until we got into the distal rectum.  Had to free the  rectum off the bladder prostate and seminal vesicles to get more distally.  The mid rectum was very bulky and fibrotic.  Challenge to get a clean mesorectal sleeve here.  Oozy but was able to provide hemostasis with cautery.  After marriage checked and we were several centimeters distal to the most distal aspect of the bulky tumor.  I decided to proceed with transection.  A good dissection we had not noticed a hole in the [name] lateral rectal wall.  Quite distal about a centimeter proximal to the pelvic floor.  I therefore decided to proceed with completion resection and transanal extraction.  A scissors to come across the rectum circumferentially.  Thursday for duodenal dilation pulled the specimen out.  We got the proximal sigmoid colon coming out the anus quite well.  We ligated the inferior minute mesenteric vein pedicle clamp and ties.  We chose an area in the proximal sigmoid colon and stapled off.  Transected intervening mesentery with clamps and ties.   We sent the rectosigmoid colon specimen off to go to pathology.  We placed a 33 EEA anvil 5 cm proximal to that descending/sigmoid staple line and closed with a 2-0 Prolene pursestring stitch.  We then replaced the colon transanally back into the pelvis.  I then robotically sewed the rectal stump using  2-0 Prolene and then later a to V-lock 2-0 around it.  We did further dissection on the rectal stump distally  to make sure we had 2 cm circumferential length distal to pursestring.  This was about 2 cm from the anal verge.  We blew up the bladder and noted a hole in the dome close to but not at the trigone.  I closed that defect using a 3-0 V-lock stitch robotically.  Saw some thinned out areas thereby ended another stitch there as well to act as a second layer coverage.  Reinsufflation of the bladder revealed no leak.  Dr Rosendo Gros scrubbed down and did gentle anal dilation and advanced the EEA stapler up the rectal stump. The spike was brought out at the  provimal end of the rectal stump under direct visualization.  I robotically attached the anvil of the proximal colon the spike of the stapler. Anvil was tightened down and held clamped for 60 seconds. The EEA stapler was fired and held clamped for 30 seconds. The stapler was released & removed. We noted 2 excellent anastomotic rings. Blue stitch is in the distal ring.  Dr Rosendo Gros did rigid proctoscopy noted the anastomosis was at 3-4 cm from the anal verge consistent with the proximal rectum.  We did a final irrigation of antibiotic solution (900 mg clindamycin/240 mg gentamicin in a liter of crystalloid) & held that for the pelvic air leak test .  The rectum was insufflated the rectum while clamping the colon proximal to that anastomosis.  There was a small posterior air leak. There was no tension of mesentery or bowel at the anastomosis.   Tissues looked viable.  Endoluminal gas was evacuated.  We changed gloves.  We did diagnostic laparoscopy.  No evidence of bowel injury.  No tension at the anastomosis.  I placed a drain as noted above.  He does of the distal anastomosis was severely, did a diverting loop ileostomy.  Chose a section of ileum about a foot proximal to the ileocecal valve and easily reached up the premarked [name] midabdominal ileostomy site.  I placed a Penrose through that mesentery and held that clamped.  We aspirated the antibiotic irrigation.  Hemostasis was good.   Ureters & bowel uninjured.  The anastomosis looked healthy.   I removed CO2 gas out through the ports.  I opened up the skin every 3 cm circular skin defect of the [name] ear paramedian region.  Got down to the entericus fashion split transversely.  Bluntly split through the rectus muscle and got into the peritoneum.  Dilated easily to 3 fingers.  Brought the Penrose and loop of distal ileum up.  Proximal end cephalad.  Confirmed that.  I closed the 28mm and RLQ 12 mm fascial port sites using 0 Vicryl stitch given his general body  habitus and smoking.  I excised a 2.5x2cm large seborrheic keratosis in the [name] subcostal region near one of the port sites.  Closed that with Monocryl as well.  Skin of port sites closed using Monocryl stitch and sterile dressing.   Matured the ileostomy using 3-0 Vicryl interrupted suture to good result.  Patient is extubated & in recovery room. I discussed postop care with the patient in detail the office & in the holding area. Instructions are written. I updated the status of the patient to the patient's family & friends.  I made recommendations.  I answered questions.  Understanding & appreciation was expressed.

## 2013-10-29 ENCOUNTER — Encounter (HOSPITAL_COMMUNITY): Payer: Self-pay | Admitting: Surgery

## 2013-10-29 ENCOUNTER — Telehealth (INDEPENDENT_AMBULATORY_CARE_PROVIDER_SITE_OTHER): Payer: Self-pay

## 2013-10-29 LAB — CBC
HEMATOCRIT: 23.8 % — AB (ref 39.0–52.0)
Hemoglobin: 7.7 g/dL — ABNORMAL LOW (ref 13.0–17.0)
MCH: 30 pg (ref 26.0–34.0)
MCHC: 32.4 g/dL (ref 30.0–36.0)
MCV: 92.6 fL (ref 78.0–100.0)
Platelets: 140 10*3/uL — ABNORMAL LOW (ref 150–400)
RBC: 2.57 MIL/uL — ABNORMAL LOW (ref 4.22–5.81)
RDW: 17.6 % — ABNORMAL HIGH (ref 11.5–15.5)
WBC: 11.9 10*3/uL — ABNORMAL HIGH (ref 4.0–10.5)

## 2013-10-29 LAB — BASIC METABOLIC PANEL
ANION GAP: 8 (ref 5–15)
BUN: 9 mg/dL (ref 6–23)
CALCIUM: 7.9 mg/dL — AB (ref 8.4–10.5)
CO2: 26 meq/L (ref 19–32)
Chloride: 105 mEq/L (ref 96–112)
Creatinine, Ser: 0.9 mg/dL (ref 0.50–1.35)
GFR calc Af Amer: >90 mL/min (ref >90)
GFR, EST NON AFRICAN AMERICAN: 81 mL/min — AB (ref >90)
GLUCOSE: 131 mg/dL — AB (ref 70–99)
Potassium: 4.3 mEq/L (ref 3.7–5.3)
Sodium: 139 mEq/L (ref 137–147)

## 2013-10-29 LAB — MAGNESIUM: Magnesium: 1.8 mg/dL (ref 1.5–2.5)

## 2013-10-29 MED ORDER — HEPARIN SODIUM (PORCINE) 5000 UNIT/ML IJ SOLN
5000.0000 [IU] | Freq: Three times a day (TID) | INTRAMUSCULAR | Status: DC
Start: 2013-10-29 — End: 2013-11-03
  Administered 2013-10-29 – 2013-11-03 (×15): 5000 [IU] via SUBCUTANEOUS
  Filled 2013-10-29 (×16): qty 1

## 2013-10-29 MED ORDER — SODIUM CHLORIDE 0.9 % IJ SOLN
10.0000 mL | INTRAMUSCULAR | Status: DC | PRN
  Administered 2013-10-31: 10 mL

## 2013-10-29 NOTE — Anesthesia Postprocedure Evaluation (Signed)
Anesthesia Post Note  Patient: Richard [name]  Procedure(s) Performed: Procedure(s) (LRB): MINIMALLY  INVASIVE ROBOT /LAPAROSCOPIC  REMOVAL OF PARTIAL COLON AND RECTUM LOW ANTERIOR RESECTION DIVERTING LOOP ILEOSTOMY RIGID PROTOSCOPY  (N/A)  Anesthesia type: General  Patient location: PACU  Post pain: Pain level controlled  Post assessment: Post-op Vital signs reviewed  Last Vitals: BP 101/49  Pulse 74  Temp(Src) 37.5 C (Oral)  Resp 16  Ht 5' 11.5" (1.816 m)  Wt 165 lb 5.5 oz (75 kg)  BMI 22.74 kg/m2  SpO2 100%  Post vital signs: Reviewed  Level of consciousness: sedated  Complications: No apparent anesthesia complications

## 2013-10-29 NOTE — Telephone Encounter (Signed)
Added pt to GI cancer conf. For 7/22 per Dr Johney Maine.

## 2013-10-29 NOTE — Evaluation (Signed)
Occupational Therapy Evaluation Patient Details Name: Richard [name] MRN: [id number] DOB: February 16, 1935 Today's Date: 10/29/2013    History of Present Illness pt is s/p ileostomy for rectal adenocarcinoma   Clinical Impression   This 79 year old man was admitted for the above surgery.  He will benefit from skilled OT to increase safety and independence with adls.  Pt was independent prior to admission.  Pain was limiting factor during evaluation and he needed up to total A for LB adls.  Goals in acute are set for supervision to occasional mod A.      Follow Up Recommendations  SNF (may progress to HH--this was first time OOB) They have other family in the area who work.  Wife is home with patient   Equipment Recommendations  3 in 1 bedside comode, possibly   Recommendations for Other Services       Precautions / Restrictions Precautions Precautions: Fall Precaution Comments: ostomy, drain Restrictions Weight Bearing Restrictions: No      Mobility Bed Mobility Overal bed mobility: Needs Assistance Bed Mobility: Sidelying to Sit   Sidelying to sit: Min assist       General bed mobility comments: increased abdominal pain:  raised HOB to help him rise up:  took 3 attempts  Transfers Overall transfer level: Needs assistance Equipment used:  (used armrest of chair) Transfers: Sit to/from [employer] to Stand: Min assist  Stand pivot transfers: Min assist       General transfer comment: steadying assistance  Would have +2 for ambulation due to impulsiveness and lines    Balance Overall balance assessment: Needs assistance Sitting-balance support: No upper extremity supported   Sitting balance - Comments: pt able to hold balance in sitting but did not stay upright without assistance when pain hit him       Standing balance comment: held onto chair armrests during transfer                            ADL Overall ADL's : Needs  assistance/impaired     Grooming: Wash/dry face;Minimal assistance;Sitting   Upper Body Bathing: Minimal assitance;Sitting   Lower Body Bathing: Maximal assistance;Sit to/from stand   Upper Body Dressing : Minimal assistance;Sitting   Lower Body Dressing: Total assistance;Sit to/from stand   Toilet Transfer: Minimal assistance;Stand-pivot (recliner)   Toileting- Clothing Manipulation and Hygiene: Bed level;Moderate assistance         General ADL Comments: pt sat eob and had abdominal pain:  required assistance to stay seated as he began to lean posteriorly.  Performed seated grooming for safety.  Pain interfered with ADLs.  Pt needed cue for water cup when he went to rinse mouth:  he initially grabbed clear cup with tea.       Vision                     Perception     Praxis      Pertinent Vitals/Pain Abdominal pain initially moderate:  Called for pain medication.  After getting to chair, pt rated pain as pretty bad.  Pain moved from abdomen to chest: RN present and took vitals.  CP decreased after a brief time.  RN brought ice packs and pt repositioned in chair     Hand Dominance     Extremity/Trunk Assessment Upper Extremity Assessment Upper Extremity Assessment: Generalized weakness (R shoulder sore-since sx; missing L DIP )  Communication Communication Communication: No difficulties   Cognition Arousal/Alertness: Awake/alert Behavior During Therapy: WFL for tasks assessed/performed Overall Cognitive Status: Impaired/Different from baseline (pt impulsive; cue for water cup to rinse mouth)                     General Comments       Exercises       Shoulder Instructions      Home Living Family/patient expects to be discharged to:: Private residence Living Arrangements: Spouse/significant other;Children Available Help at Discharge: Family Type of Home: House Home Access: Stairs to enter Technical brewer of Steps: 3          Bathroom Shower/Tub: Teacher, early years/pre: Standard                Prior Functioning/Environment Level of Independence: Independent             OT Diagnosis: Generalized weakness   OT Problem List: Decreased strength;Decreased activity tolerance;Impaired balance (sitting and/or standing);Decreased safety awareness;Pain   OT Treatment/Interventions: Self-care/ADL training;DME and/or AE instruction;Patient/family education;Balance training    OT Goals(Current goals can be found in the care plan section) Acute Rehab OT Goals Patient Stated Goal: get up OT Goal Formulation: With patient Time For Goal Achievement: 11/12/13 Potential to Achieve Goals: Good ADL Goals Pt Will Perform Grooming: with supervision;standing Pt Will Perform Lower Body Bathing: with min assist;with adaptive equipment;sit to/from stand Pt Will Perform Lower Body Dressing: with mod assist;sit to/from stand Pt Will Transfer to Toilet: with min guard assist;bedside commode;ambulating Pt Will Perform Toileting - Clothing Manipulation and hygiene: with supervision;sit to/from stand Additional ADL Goal #1: pt will perform bed mobility at supervision level from flat bed in preparation for adls  OT Frequency: Min 2X/week   Barriers to D/C:            Co-evaluation              End of Session    Activity Tolerance: Patient limited by pain Patient left: in chair;with call bell/phone within reach;with chair alarm set   Time: 2774-1287 OT Time Calculation (min): 26 min Charges:  OT General Charges $OT Visit: 1 Procedure OT Evaluation $Initial OT Evaluation Tier I: 1 Procedure OT Treatments $Self Care/Home Management : 8-22 mins G-Codes:    Naria Abbey 11/28/2013, 1:58 PM  Lesle Chris, OTR/L [phone] 2013/11/28

## 2013-10-29 NOTE — Telephone Encounter (Signed)
Called back to get the date pushed back for GI cancer conf. For 7/29 since Dr Johney Maine out of office on 7/22.

## 2013-10-29 NOTE — Progress Notes (Signed)
Peripherally Inserted Central Catheter/Midline Placement  The IV Nurse has discussed with the patient and/or persons authorized to consent for the patient, the purpose of this procedure and the potential benefits and risks involved with this procedure.  The benefits include less needle sticks, lab draws from the catheter and patient may be discharged home with the catheter.  Risks include, but not limited to, infection, bleeding, blood clot (thrombus formation), and puncture of an artery; nerve damage and irregular heat beat.  Alternatives to this procedure were also discussed.  PICC/Midline Placement Documentation        Richard [name] 10/29/2013, 9:44 AM

## 2013-10-29 NOTE — Care Management Note (Addendum)
    Page 1 of 2   11/06/2013     1:22:35 PM CARE MANAGEMENT NOTE 11/06/2013  Patient:  Richard [name],Richard [name]   Account Number:  [id number]  Date Initiated:  10/29/2013  Documentation initiated by:  [facility]  Subjective/Objective Assessment:   adm: MID/LOW RECTAL CANCER     Action/Plan:   discharge planning   Anticipated DC Date:  12-07-13   Anticipated DC Plan:  [place]  CM consult      [facility] Choice  HOME HEALTH   Choice offered to / List presented to:          [facility] arranged  IV Antibiotics      [place].   Status of service:  Completed, signed off Medicare Important Message given?  YES (If response is "NO", the following Medicare IM given date fields will be blank) Date Medicare IM given:  10/22/2013 Medicare IM given by:  DAVIS,RHONDA Date Additional Medicare IM given:  11/06/2013 Additional Medicare IM given by:  [facility]  Discharge Disposition:    Per UR Regulation:  Reviewed for med. necessity/level of care/duration of stay  If discussed at [place] of Stay Meetings, dates discussed:    Comments:  11/06/13 Mete Purdum RN,BSN NCM [phone] TRANSFER FROM SDU.D/C PLAN SNF.  [id number]/IOMBTD DAvis,Rn,BSn,CCM: post op day 6, patient remains septic, a.line in place/off iv levophed am of 072015/s/p Procedure(s): MINIMALLY  INVASIVE ROBOT /LAPAROSCOPIC  REMOVAL OF PARTIAL COLON AND RECTUM LOW ANTERIOR RESECTION DIVERTING LOOP ILEOSTOMY RIGID PROTOSCOPY  (N/A) AKI/ hx of etoh abuse/pt transferred to icu on [id number] due to hypotension and requiring iv levophed support/a.line in place/iv abx/  10/29/13 10:00 CM spoke with pt who states it is fine to set up everything with his wife, [name] [phone] or cell [phone].  CM called [name] to offer choice and she states [facility] is fine.  Address and contact information verified with [name].  CM called referral to [facility] for HHPT/RN for IV ABX.  Will  continue to follow for PT/OT evals for possible DME.  Mariane Masters, Durel Salts [phone].

## 2013-10-29 NOTE — Progress Notes (Addendum)
[facility]  [address] [place], [place] [address] Phone: [phone] FAX: [phone]    Richard [name] [id number] 04-02-1935  CARE TEAM:  PCP: Leonard Downing, MD  Outpatient Care Team: Patient Care Team: Leonard Downing, MD as PCP - General (Family Medicine) Adin Hector, MD as Consulting Physician (General Surgery) Wonda Horner, MD as Consulting Physician (Gastroenterology) Linna Hoff, MD as Consulting Physician (Orthopedic Surgery) Hayden Pedro, MD as Consulting Physician (Ophthalmology) Carola Frost, RN as Registered Nurse (Oncology) Marye Round, MD as Consulting Physician (Radiation Oncology) Ladell Pier, MD as Consulting Physician (Oncology)  Inpatient Treatment Team: Treatment Team: Attending Provider: Adin Hector, MD   Subjective:  Patient feeling fine.  Wife at bedside.  Wanting to get up.  Objective:  Vital signs:  Filed Vitals:   10/16/2013 1915 10/29/13 0200 10/29/13 0500 10/29/13 0559  BP: 100/53 86/47  101/49  Pulse: 72 74  74  Temp: 98.8 F (37.1 C) 98.6 F (37 C)  99.5 F (37.5 C)  TempSrc: Oral Oral  Oral  Resp: $Remo'14 16  16  'aVEsg$ Height:      Weight:   165 lb 5.5 oz (75 kg)   SpO2: 100% 96%  100%    Last BM Date: 10/27/13  Intake/Output   Yesterday:  07/14 0701 - 07/15 0700 In: 5733.8 [P.O.:220; I.V.:5463.8; IV Piggyback:50] Out: 3060 [Urine:1875; Drains:485; Blood:700] This shift:  Total I/O In: 983.8 [P.O.:220; I.V.:713.8; IV Piggyback:50] Out: 4825 [Urine:1225; Drains:380]  Bowel function:  Flatus: Y in bag  BM: no  Drain: thinly serosanguinous  Physical Exam:  General: Pt awake/alert/oriented x4 in acute distress Eyes: PERRL, normal EOM.  Sclera clear.  No icterus Neuro: CN II-XII intact w/o focal sensory/motor deficits. Lymph: No head/neck/groin lymphadenopathy Psych:  No delerium/psychosis/paranoia HENT: Normocephalic, Mucus membranes moist.   No thrush Neck: Supple, No tracheal deviation Chest: No chest wall pain w good excursion CV:  Pulses intact.  Regular rhythm MS: Normal AROM mjr joints.  No obvious deformity Abdomen: Soft.  Nondistended.  Ileostomy pink.  Gas in bed.  No effluent/succus.  Foley catheter in place.  Mildly tender at incisions only.  No evidence of peritonitis.  No incarcerated hernias. Ext:  SCDs BLE.  No mjr edema.  No cyanosis Skin: No petechiae / purpura   Problem List:   Principal Problem:   Rectal adenocarcinoma (7cm posterior) Active Problems:   Tobacco abuse   Anemia of chronic disease   Rectal cancer   Assessment  Richard [name]  79 y.o. male  1 Day Post-Op  Procedure(s): PRE-OPERATIVE DIAGNOSIS: MID LOW RECTAL CANCER  POST-OPERATIVE DIAGNOSIS: MID/LOW RECTAL CANCER  PROCEDURE: Procedure(s):  MINIMALLY INVASIVE ROBOTIC LOW ANTERIOR RESECTION  DIVERTING LOOP ILEOSTOMY  RIGID PROCTOSCOPY  Excision of seborrheic keratosis  Primary bladder repair  SURGEON: Surgeon(s):  Adin Hector, MD  Ralene Ok, MD Asst  ANESTHESIA: local and general      Recovering remarkably so well status post extensive surgery for bulky mid/low distal rectal cancer with diverting loop ileostomy and bladder repair.  Plan:  Continue Foley catheter for at least one week.  Plan cystogram next week.  No leak, can removed.  Continue ileal loop diversion.  Advance diet per protocol.  Ileostomy care/strain.  He keeps the drain for several weeks and will go home with this.  Please PICC line and set up home IV infusion therapy q. Monday Wednesday Friday to avoid episodes of dehydration/renal failure with  diverting loop ileostomy.  Blood pressure control.  Expected postoperative anemia.  Not symptomatic.  Hold off on transfusion.  -VTE prophylaxis- SCDs, etc  -mobilize as tolerated to help recovery.  GET HIM UP!  I updated the patient's status to the patient & his wife.  Recommendations were  made.  Questions were answered.  The patient expressed understanding & appreciation.   Adin Hector, M.D., F.A.C.S. Gastrointestinal and Minimally Invasive Surgery [facility] [address] [place], [place] [address] [phone] Main / Paging   10/29/2013   Results:   Labs: Results for orders placed during the hospital encounter of 11/03/2013 (from the past 48 hour(s))  TYPE AND SCREEN     Status: None   Collection Time    11/10/2013  5:49 AM      Result Value Ref Range   ABO/RH(D) A NEG     Antibody Screen NEG     Sample Expiration 10/31/2013    CBC     Status: Abnormal   Collection Time    11/14/2013  5:17 PM      Result Value Ref Range   WBC 14.6 (*) 4.0 - 10.5 K/uL   RBC 3.07 (*) 4.22 - 5.81 MIL/uL   Hemoglobin 9.1 (*) 13.0 - 17.0 g/dL   HCT 28.2 (*) 39.0 - 52.0 %   MCV 91.9  78.0 - 100.0 fL   MCH 29.6  26.0 - 34.0 pg   MCHC 32.3  30.0 - 36.0 g/dL   RDW 17.4 (*) 11.5 - 15.5 %   Platelets 162  150 - 400 K/uL  CREATININE, SERUM     Status: Abnormal   Collection Time    10/17/2013  5:17 PM      Result Value Ref Range   Creatinine, Ser 0.87  0.50 - 1.35 mg/dL   GFR calc non Af Amer 82 (*) >90 mL/min   GFR calc Af Amer >90  >90 mL/min   Comment: (NOTE)     The eGFR has been calculated using the CKD EPI equation.     This calculation has not been validated in all clinical situations.     eGFR's persistently <90 mL/min signify possible Chronic Kidney     Disease.  BASIC METABOLIC PANEL     Status: Abnormal   Collection Time    10/29/13  5:15 AM      Result Value Ref Range   Sodium 139  137 - 147 mEq/L   Potassium 4.3  3.7 - 5.3 mEq/L   Chloride 105  96 - 112 mEq/L   CO2 26  19 - 32 mEq/L   Glucose, Bld 131 (*) 70 - 99 mg/dL   BUN 9  6 - 23 mg/dL   Creatinine, Ser 0.90  0.50 - 1.35 mg/dL   Calcium 7.9 (*) 8.4 - 10.5 mg/dL   GFR calc non Af Amer 81 (*) >90 mL/min   GFR calc Af Amer >90  >90 mL/min   Comment: (NOTE)     The  eGFR has been calculated using the CKD EPI equation.     This calculation has not been validated in all clinical situations.     eGFR's persistently <90 mL/min signify possible Chronic Kidney     Disease.   Anion gap 8  5 - 15  CBC     Status: Abnormal   Collection Time    10/29/13  5:15 AM      Result Value Ref Range  WBC 11.9 (*) 4.0 - 10.5 K/uL   RBC 2.57 (*) 4.22 - 5.81 MIL/uL   Hemoglobin 7.7 (*) 13.0 - 17.0 g/dL   HCT 23.8 (*) 39.0 - 52.0 %   MCV 92.6  78.0 - 100.0 fL   MCH 30.0  26.0 - 34.0 pg   MCHC 32.4  30.0 - 36.0 g/dL   RDW 17.6 (*) 11.5 - 15.5 %   Platelets 140 (*) 150 - 400 K/uL  MAGNESIUM     Status: None   Collection Time    10/29/13  5:15 AM      Result Value Ref Range   Magnesium 1.8  1.5 - 2.5 mg/dL    Imaging / Studies: No results found.  Medications / Allergies: per chart  Antibiotics: Anti-infectives   Start     Dose/Rate Route Frequency Ordered Stop   10/30/2013 2200  cefoTEtan (CEFOTAN) 2 g in dextrose 5 % 50 mL IVPB     2 g 100 mL/hr over 30 Minutes Intravenous Every 12 hours 10/25/2013 1639 11/06/2013 2203   10/26/2013 0545  clindamycin (CLEOCIN) 900 mg, gentamicin (GARAMYCIN) 240 mg in sodium chloride 0.9 % 1,000 mL for intraperitoneal lavage  Status:  Discontinued    Comments:  Pharmacy may adjust dosing strength, schedule, rate of infusion, etc as needed to optimize therapy    Intraperitoneal To Surgery 10/22/2013 0536 11/10/2013 1625   11/13/2013 0536  cefoTEtan (CEFOTAN) 2 g in dextrose 5 % 50 mL IVPB     2 g 100 mL/hr over 30 Minutes Intravenous On call to O.R. 10/16/2013 0536 11/06/2013 1344       Note: Portions of this report may have been transcribed using voice recognition software. Every effort was made to ensure accuracy; however, inadvertent computerized transcription errors may be present.   Any transcriptional errors that result from this process are unintentional.

## 2013-10-29 NOTE — Telephone Encounter (Signed)
Message copied by Illene Regulus on Wed Oct 29, 2013  9:20 AM ------      Message from: Adin Hector      Created: Wed Oct 29, 2013  8:38 AM       Please add to GI tumor board.  Rectal cancer.  Dr. Benay Spice, Dr. Lisbeth Renshaw, Dr. Johney Maine, Dr Penelope Coop ------

## 2013-10-29 NOTE — Progress Notes (Signed)
INITIAL NUTRITION ASSESSMENT  DOCUMENTATION CODES Per approved criteria  -Not Applicable   INTERVENTION: - Pureed diet to be advanced to Heart Healthy when patient tolerates full liquid/pureed and passes flatus. - Educated patient and wife on a post ileostomy diet.  AND handout provided with RD contact information.  Teach back method used.    NUTRITION DIAGNOSIS: Inadequate oral intake related to post op as evidenced by stated intake..   Goal: Tolerate diet with intake to meet >75% estimated needs.  Monitor:  Diet tolerance and intake, labs weight.  Reason for Assessment: MST  79 y.o. male  Admitting Dx: Rectal adenocarcinoma  ASSESSMENT: Patient s/p ileostomy for rectal adenocarcinoma.    Tolerating a light lunch with clear liquids, pureed soup and ice cream.  To get a pureed dinner and advance to Heart Healthy if passing flatus and continued tolerance of po intake.    Good intake prior to admit.  Patient had seen the [facility] RD 07/28/13 secondary to weight loss and weight has increased since that time.  UBW 159 lbs 07/16/13.    Height: Ht Readings from Last 1 Encounters:  11/10/2013 5' 11.5" (1.816 m)    Weight: Wt Readings from Last 1 Encounters:  10/29/13 165 lb 5.5 oz (75 kg)    Ideal Body Weight: 175 lbs  % Ideal Body Weight: 94  Wt Readings from Last 10 Encounters:  10/29/13 165 lb 5.5 oz (75 kg)  10/29/13 165 lb 5.5 oz (75 kg)  10/22/13 158 lb (71.668 kg)  09/25/13 155 lb 8 oz (70.534 kg)  09/05/13 152 lb 8 oz (69.174 kg)  09/03/13 152 lb (68.947 kg)  08/20/13 152 lb 12.8 oz (69.31 kg)  08/08/13 153 lb 3.2 oz (69.491 kg)  08/05/13 151 lb 3.2 oz (68.584 kg)  07/28/13 149 lb 4.8 oz (67.722 kg)    Usual Body Weight: 159 lbs 07/16/13  % Usual Body Weight: 104  BMI:  Body mass index is 22.74 kg/(m^2).  Estimated Nutritional Needs: Kcal: 2100-2200 Protein: 95-105 gm Fluid: >2L daily  Skin: incision/ileostomy  Diet Order: Dysphagia  1  EDUCATION NEEDS: -Education needs addressed   Intake/Output Summary (Last 24 hours) at 10/29/13 1442 Last data filed at 10/29/13 1400  Gross per 24 hour  Intake 1533.75 ml  Output   2520 ml  Net -986.25 ml     Labs:   Recent Labs Lab 10/31/2013 1717 10/29/13 0515  NA  --  139  K  --  4.3  CL  --  105  CO2  --  26  BUN  --  9  CREATININE 0.87 0.90  CALCIUM  --  7.9*  MG  --  1.8  GLUCOSE  --  131*    CBG (last 3)  No results found for this basename: GLUCAP,  in the last 72 hours  Scheduled Meds: . acetaminophen  1,000 mg Oral TID  . alvimopan  12 mg Oral BID  . heparin subcutaneous  5,000 Units Subcutaneous 3 times per day  . lip balm  1 application Topical BID  . multivitamin with minerals  1 tablet Oral Daily  . saccharomyces boulardii  250 mg Oral BID    Continuous Infusions: . dextrose 5 % and 0.9 % NaCl with KCl 40 mEq/L 75 mL/hr at 10/29/13 1040  . lactated ringers      Past Medical History  Diagnosis Date  . Rectal adenocarcinoma (7cm posterior) 06/16/2013  . Kyphoscoliosis deformity of spine   . History of  radiation therapy 07/14/13-08/20/13    rectal, 50.4Gy/  . Psoriasis     SCALP  . Hemorrhoids   . History of transfusion   . Anemia of chronic disease 10/21/2013    Past Surgical History  Procedure Laterality Date  . I&d extremity  06/07/2011    Procedure: IRRIGATION AND DEBRIDEMENT EXTREMITY;  Surgeon: Linna Hoff, MD;  Location: [facility];  Service: Orthopedics;  Laterality: Left;  . Amputation  06/07/2011    Procedure: AMPUTATION DIGIT;  Surgeon: Linna Hoff, MD;  Location: [facility];  Service: Orthopedics;  Laterality: Left;  Revision multiple fingers index and middle   . Eus N/A 06/25/2013    Procedure: LOWER ENDOSCOPIC ULTRASOUND (EUS);  Surgeon: Arta Silence, MD;  Location: [facility];  Service: Endoscopy;  Laterality: N/A;  . Colonoscopy w/ biopsies  05/29/13  . Eus  06/25/13    Antonieta Iba, RD, LDN Clinical Inpatient  Dietitian Pager:  [phone] Weekend and after hours pager:  [phone]

## 2013-10-29 NOTE — Evaluation (Signed)
Physical Therapy Evaluation Patient Details Name: Ziair Penson MRN: [id number] DOB: 04/03/1935 Today's Date: 10/29/2013   History of Present Illness  79 yo male s/p LAR diverting ileostomy bladder repair 10/18/2013. Hx of uretal cancer, kyphoscoliosis.   Clinical Impression  On eval, pt required Min guard assist for mobility-able to ambulate ~150 feet with walker. Tolerated well. Recommend daily mobility with nursing, in addition to physical therapy. D/C plan depends on progress however pt may be able to d/c home if he continues to do well.     Follow Up Recommendations SNF vs Home health PT;Supervision/Assistance - 24 hour (depending on progress)    Equipment Recommendations  Rolling walker with 5" wheels (possibly)    Recommendations for Other Services OT consult     Precautions / Restrictions Precautions Precautions: Fall Precaution Comments: ostomy, drain Restrictions Weight Bearing Restrictions: No      Mobility  Bed Mobility Overal bed mobility: Needs Assistance Bed Mobility: Sidelying to Sit   Sidelying to sit: Min assist       General bed mobility comments: pt sitting in recliner  Transfers Overall transfer level: Needs assistance Equipment used: Rolling walker (2 wheeled) Transfers: Sit to/from Stand Sit to Stand: Min guard Stand pivot transfers: Min assist       General transfer comment: close guard for safety. VCs safety, hand placement  Ambulation/Gait Ambulation/Gait assistance: Min guard Ambulation Distance (Feet): 150 Feet Assistive device: Rolling walker (2 wheeled) Gait Pattern/deviations: Step-through pattern;Decreased stride length;Drifts right/left     General Gait Details: close guard for safety. Pt tolerated well. No LOB with use of walker.   Stairs            Wheelchair Mobility    Modified Rankin (Stroke Patients Only)       Balance Overall balance assessment: Needs assistance Sitting-balance support: No upper extremity  supported   Sitting balance - Comments: pt able to hold balance in sitting but did not stay upright without assistance when pain hit him       Standing balance comment: held onto chair armrests during transfer                             Pertinent Vitals/Pain Abdomen 5/10 with activity.     Home Living Family/patient expects to be discharged to:: Private residence Living Arrangements: Spouse/significant other;Children Available Help at Discharge: Family Type of Home: House Home Access: Stairs to enter   Technical brewer of Steps: 3          Prior Function Level of Independence: Independent               Hand Dominance        Extremity/Trunk Assessment   Upper Extremity Assessment: Defer to OT evaluation           Lower Extremity Assessment: Generalized weakness      Cervical / Trunk Assessment: Kyphotic  Communication   Communication: No difficulties  Cognition Arousal/Alertness: Awake/alert Behavior During Therapy: WFL for tasks assessed/performed Overall Cognitive Status: Within Functional Limits for tasks assessed                      General Comments      Exercises        Assessment/Plan    PT Assessment Patient needs continued PT services  PT Diagnosis Difficulty walking;Generalized weakness;Acute pain   PT Problem List Decreased strength;Decreased activity tolerance;Decreased balance;Decreased mobility;Pain;Decreased knowledge of use of DME  PT Treatment Interventions DME instruction;Gait training;Functional mobility training;Therapeutic activities;Therapeutic exercise;Patient/family education;Balance training   PT Goals (Current goals can be found in the Care Plan section) Acute Rehab PT Goals Patient Stated Goal: less pain PT Goal Formulation: With patient/family Time For Goal Achievement: 11/12/13 Potential to Achieve Goals: Good    Frequency Min 3X/week   Barriers to discharge         Co-evaluation               End of Session   Activity Tolerance: Patient tolerated treatment well Patient left: in chair;with call bell/phone within reach;with family/visitor present           Time: 4709-2957 PT Time Calculation (min): 12 min   Charges:   PT Evaluation $Initial PT Evaluation Tier I: 1 Procedure PT Treatments $Gait Training: 8-22 mins   PT G Codes:          Weston Anna, MPT Pager: [phone]

## 2013-10-30 LAB — CBC
HCT: 27.6 % — ABNORMAL LOW (ref 39.0–52.0)
Hemoglobin: 8.9 g/dL — ABNORMAL LOW (ref 13.0–17.0)
MCH: 29.9 pg (ref 26.0–34.0)
MCHC: 32.2 g/dL (ref 30.0–36.0)
MCV: 92.6 fL (ref 78.0–100.0)
PLATELETS: 167 10*3/uL (ref 150–400)
RBC: 2.98 MIL/uL — ABNORMAL LOW (ref 4.22–5.81)
RDW: 17.6 % — ABNORMAL HIGH (ref 11.5–15.5)
WBC: 13.4 10*3/uL — ABNORMAL HIGH (ref 4.0–10.5)

## 2013-10-30 MED ORDER — DIPHENHYDRAMINE HCL 50 MG/ML IJ SOLN: 12.5000 mg | Freq: Four times a day (QID) | INTRAMUSCULAR | Status: DC | PRN

## 2013-10-30 MED ORDER — PROMETHAZINE HCL 25 MG/ML IJ SOLN
6.2500 mg | Freq: Four times a day (QID) | INTRAMUSCULAR | Status: DC | PRN
  Administered 2013-10-30 – 2013-10-31 (×2): 6.25 mg via INTRAVENOUS
  Administered 2013-11-06: 12.5 mg via INTRAVENOUS
  Filled 2013-10-30 (×3): qty 1

## 2013-10-30 MED ORDER — ONDANSETRON 8 MG/NS 50 ML IVPB
8.0000 mg | Freq: Four times a day (QID) | INTRAVENOUS | Status: DC | PRN
  Filled 2013-10-30: qty 8

## 2013-10-30 MED ORDER — ALUM & MAG HYDROXIDE-SIMETH 200-200-20 MG/5ML PO SUSP
30.0000 mL | Freq: Once | ORAL | Status: AC
  Administered 2013-10-30: 30 mL via ORAL
  Filled 2013-10-30: qty 30

## 2013-10-30 MED ORDER — ONDANSETRON 4 MG PO TBDP
4.0000 mg | ORAL_TABLET | Freq: Four times a day (QID) | ORAL | Status: DC | PRN
  Filled 2013-10-30: qty 2

## 2013-10-30 MED ORDER — LACTATED RINGERS IV BOLUS (SEPSIS): 1000.0000 mL | Freq: Three times a day (TID) | INTRAVENOUS | Status: DC | PRN

## 2013-10-30 MED ORDER — FENTANYL CITRATE 0.05 MG/ML IJ SOLN
25.0000 ug | INTRAMUSCULAR | Status: DC | PRN
  Administered 2013-10-30 (×3): 25 ug via INTRAVENOUS
  Administered 2013-10-31: 50 ug via INTRAVENOUS
  Administered 2013-10-31 (×4): 25 ug via INTRAVENOUS
  Administered 2013-10-31 (×2): 50 ug via INTRAVENOUS
  Administered 2013-10-31 (×2): 25 ug via INTRAVENOUS
  Administered 2013-11-01 – 2013-11-06 (×5): 50 ug via INTRAVENOUS
  Administered 2013-11-06 (×2): 25 ug via INTRAVENOUS
  Filled 2013-10-30 (×18): qty 2

## 2013-10-30 MED ORDER — ONDANSETRON HCL 4 MG/2ML IJ SOLN: 4.0000 mg | Freq: Four times a day (QID) | INTRAMUSCULAR | Status: DC | PRN

## 2013-10-30 MED ORDER — ONDANSETRON HCL 4 MG/2ML IJ SOLN
4.0000 mg | Freq: Four times a day (QID) | INTRAMUSCULAR | Status: DC | PRN
  Administered 2013-10-30: 4 mg via INTRAVENOUS
  Filled 2013-10-30: qty 2

## 2013-10-30 MED ORDER — PROMETHAZINE HCL 25 MG/ML IJ SOLN: 6.2500 mg | Freq: Four times a day (QID) | INTRAMUSCULAR | Status: DC | PRN

## 2013-10-30 NOTE — Progress Notes (Signed)
[facility]  [address] [place], [place] [address] Phone: [phone] FAX: [phone]    [name] [id number] 1934/07/02  CARE TEAM:  PCP: Leonard Downing, MD  Outpatient Care Team: Patient Care Team: Leonard Downing, MD as PCP - General (Family Medicine) Adin Hector, MD as Consulting Physician (General Surgery) Wonda Horner, MD as Consulting Physician (Gastroenterology) Linna Hoff, MD as Consulting Physician (Orthopedic Surgery) Hayden Pedro, MD as Consulting Physician (Ophthalmology) Carola Frost, RN as Registered Nurse (Oncology) Marye Round, MD as Consulting Physician (Radiation Oncology) Ladell Pier, MD as Consulting Physician (Oncology)  Inpatient Treatment Team: Treatment Team: Attending Provider: Adin Hector, MD; Registered Nurse: Kristopher Oppenheim, RN; Respiratory Therapist: Miquel Dunn, RRT; Occupational Therapist: Lesle Chris, OT   Subjective:  Patient feeling bloated w pain Dilaudid sedating  Objective:  Vital signs:  Filed Vitals:   10/29/13 1000 10/29/13 1319 10/30/13 0235 10/30/13 0551  BP: 100/56 124/54 109/51 128/65  Pulse: 75 75 83 79  Temp: 99.4 F (37.4 C) 97.9 F (36.6 C) 98.4 F (36.9 C) 98.3 F (36.8 C)  TempSrc: Oral Oral  Oral  Resp: $Remo'18 20  16  'zBuMO$ Height:      Weight:      SpO2: 95% 100% 97% 99%    Last BM Date: 10/29/13 (stoma had stool present at change per day shift)  Intake/Output   Yesterday:  07/15 0701 - 07/16 0700 In: 2370 [P.O.:720; I.V.:1650] Out: 2790 [Urine:2125; Drains:465; Stool:200] This shift:     Bowel function:  Flatus: Y in bag  BM: Thin green in bag  Drain: thinly serosanguinous  Physical Exam:  General: Pt awake/alert/oriented x4 in acute distress Eyes: PERRL, normal EOM.  Sclera clear.  No icterus Neuro: CN II-XII intact w/o focal sensory/motor deficits. Lymph: No head/neck/groin  lymphadenopathy Psych:  No delerium/psychosis/paranoia HENT: Normocephalic, Mucus membranes moist.  No thrush Neck: Supple, No tracheal deviation Chest: No chest wall pain w good excursion CV:  Pulses intact.  Regular rhythm MS: Normal AROM mjr joints.  No obvious deformity Abdomen: Soft.  Very distended.  Ileostomy pink.  Gas in bed.  No effluent/succus.  Foley catheter in place.  Mod tender at LLQ but no evidence of peritonitis.  No incarcerated hernias. Ext:  SCDs BLE.  No mjr edema.  No cyanosis Skin: No petechiae / purpura   Problem List:   Principal Problem:   Rectal adenocarcinoma s/p robotic LAR /ileostomy 11/14/2013 Active Problems:   Tobacco abuse   Anemia of chronic disease   Assessment  Richard [name]  79 y.o. male  2 Days Post-Op  Procedure(s): PRE-OPERATIVE DIAGNOSIS: MID LOW RECTAL CANCER  POST-OPERATIVE DIAGNOSIS: MID/LOW RECTAL CANCER  PROCEDURE: Procedure(s):  MINIMALLY INVASIVE ROBOTIC LOW ANTERIOR RESECTION  DIVERTING LOOP ILEOSTOMY  RIGID PROCTOSCOPY  Excision of seborrheic keratosis  Primary bladder repair  SURGEON: Surgeon(s):  Adin Hector, MD  Ralene Ok, MD Asst  ANESTHESIA: local and general      Probable ileus status post extensive surgery for bulky mid/low distal rectal cancer with diverting loop ileostomy and bladder repair.  Plan:  Sips only.  NGT if worse  No tachycardia or peritonitis - follow CBC, VS, PExam, etc  Continue Foley catheter for at least one week.  Plan cystogram next week.  No leak, can removed.  Continue ileal loop diversion.  Ileostomy care/training - d/w [facility]  He keeps the drain for several weeks and will go home with  this.  PICC line for up home IV infusion therapy q. Monday Wednesday Friday to avoid episodes of dehydration/renal failure with diverting loop ileostomy.  Blood pressure control.  Expected postoperative anemia.  Not symptomatic.  Hold off on transfusion.  Follow closely  -VTE  prophylaxis- SCDs, etc  -mobilize as tolerated to help recovery.  GET HIM UP!  I updated the patient's status to the patient.  Recommendations were made.  Questions were answered.  The patient expressed understanding & appreciation.   Adin Hector, M.D., F.A.C.S. Gastrointestinal and Minimally Invasive Surgery [facility] [address] [place], [place] [address] [phone] Main / Paging   10/30/2013   Results:   Labs: Results for orders placed during the hospital encounter of 11/10/2013 (from the past 48 hour(s))  CBC     Status: Abnormal   Collection Time    10/26/2013  5:17 PM      Result Value Ref Range   WBC 14.6 (*) 4.0 - 10.5 K/uL   RBC 3.07 (*) 4.22 - 5.81 MIL/uL   Hemoglobin 9.1 (*) 13.0 - 17.0 g/dL   HCT 28.2 (*) 39.0 - 52.0 %   MCV 91.9  78.0 - 100.0 fL   MCH 29.6  26.0 - 34.0 pg   MCHC 32.3  30.0 - 36.0 g/dL   RDW 17.4 (*) 11.5 - 15.5 %   Platelets 162  150 - 400 K/uL  CREATININE, SERUM     Status: Abnormal   Collection Time    11/06/2013  5:17 PM      Result Value Ref Range   Creatinine, Ser 0.87  0.50 - 1.35 mg/dL   GFR calc non Af Amer 82 (*) >90 mL/min   GFR calc Af Amer >90  >90 mL/min   Comment: (NOTE)     The eGFR has been calculated using the CKD EPI equation.     This calculation has not been validated in all clinical situations.     eGFR's persistently <90 mL/min signify possible Chronic Kidney     Disease.  BASIC METABOLIC PANEL     Status: Abnormal   Collection Time    10/29/13  5:15 AM      Result Value Ref Range   Sodium 139  137 - 147 mEq/L   Potassium 4.3  3.7 - 5.3 mEq/L   Chloride 105  96 - 112 mEq/L   CO2 26  19 - 32 mEq/L   Glucose, Bld 131 (*) 70 - 99 mg/dL   BUN 9  6 - 23 mg/dL   Creatinine, Ser 0.90  0.50 - 1.35 mg/dL   Calcium 7.9 (*) 8.4 - 10.5 mg/dL   GFR calc non Af Amer 81 (*) >90 mL/min   GFR calc Af Amer >90  >90 mL/min   Comment: (NOTE)     The eGFR has been calculated using the  CKD EPI equation.     This calculation has not been validated in all clinical situations.     eGFR's persistently <90 mL/min signify possible Chronic Kidney     Disease.   Anion gap 8  5 - 15  CBC     Status: Abnormal   Collection Time    10/29/13  5:15 AM      Result Value Ref Range   WBC 11.9 (*) 4.0 - 10.5 K/uL   RBC 2.57 (*) 4.22 - 5.81 MIL/uL   Hemoglobin 7.7 (*) 13.0 - 17.0 g/dL   HCT 23.8 (*)  39.0 - 52.0 %   MCV 92.6  78.0 - 100.0 fL   MCH 30.0  26.0 - 34.0 pg   MCHC 32.4  30.0 - 36.0 g/dL   RDW 17.6 (*) 11.5 - 15.5 %   Platelets 140 (*) 150 - 400 K/uL  MAGNESIUM     Status: None   Collection Time    10/29/13  5:15 AM      Result Value Ref Range   Magnesium 1.8  1.5 - 2.5 mg/dL    Imaging / Studies: No results found.  Medications / Allergies: per chart  Antibiotics: Anti-infectives   Start     Dose/Rate Route Frequency Ordered Stop   11/06/2013 2200  cefoTEtan (CEFOTAN) 2 g in dextrose 5 % 50 mL IVPB     2 g 100 mL/hr over 30 Minutes Intravenous Every 12 hours 11/13/2013 1639 11/06/2013 2203   10/29/2013 0545  clindamycin (CLEOCIN) 900 mg, gentamicin (GARAMYCIN) 240 mg in sodium chloride 0.9 % 1,000 mL for intraperitoneal lavage  Status:  Discontinued    Comments:  Pharmacy may adjust dosing strength, schedule, rate of infusion, etc as needed to optimize therapy    Intraperitoneal To Surgery 11/10/2013 0536 11/06/2013 1625   11/02/2013 0536  cefoTEtan (CEFOTAN) 2 g in dextrose 5 % 50 mL IVPB     2 g 100 mL/hr over 30 Minutes Intravenous On call to O.R. 11/08/2013 0536 10/24/2013 1344       Note: Portions of this report may have been transcribed using voice recognition software. Every effort was made to ensure accuracy; however, inadvertent computerized transcription errors may be present.   Any transcriptional errors that result from this process are unintentional.

## 2013-10-30 NOTE — Clinical Documentation Improvement (Signed)
Noted 10/25/2013 Op Note... "Bladder repaired at proximal dome x 2 layers (V-lock)"... "We did further dissection on the rectal stump distally to make sure we had 2 cm circumferential length distal to pursestring. This was about 2 cm from the anal verge. We blew up the bladder and noted a hole in the dome close to but not at the trigone. I closed that defect using a 3-0 V-lock stitch robotically. Saw some thinned out areas thereby ended another stitch there as well to act as a second layer coverage. Reinsufflation of the bladder revealed no leak.  For accurate Dx specificity and severity can noted findings be further clarified with procedure noted. Thank you  Possible Clinical Conditions?   The "Bladder repaired at proximal dome x 2 layers (V-lock)" diagnosis is integral to procedure. The "Bladder repaired at proximal dome x 2 layers (V-lock)" diagnosis is a complication to the procedure The "Bladder repaired at proximal dome x 2 layers (V-lock)" diagnosis is unrelated to the procedure Other Condition Cannot Clinically Determine  Supporting Information: Risk Factors: See Above note Signs & Symptoms: See Above note Diagnostics: See Above note Treatment: See Above note  Thank You, Ermelinda Das, RN, BSN, CCDS Certified Clinical Documentation Specialist Pager: [facility]

## 2013-10-30 NOTE — Consult Note (Signed)
WOC ostomy consult note Stoma type/location: RMQ ileostomy Stomal assessment/size: 2 inches round, moist, edematous. Slightly larger at dome than at base. Peristomal assessment: intact, clear Treatment options for stomal/peristomal skin: Skin barrier ring Output: 150ml in pouch, light brown effluent Ostomy pouching: 2pc. 2 and 1/4 inch pouching system Education provided: Patient is distended and miserable at the moment.  Unable to pass flatus, feeling nauseated.  Granddaughter in room, present for ostomy pouching session.  Patient unable to engage or ask questions, but granddaughter is and does.  Ostomy education booklet left in room and supplies are provided for staff to the bedside. [facility] nursing team will follow, and will remain available to this patient, the nursing, surgical and medical teams.   Thanks, Maudie Flakes, MSN, RN, [place], [place], [place] [phone])

## 2013-10-30 NOTE — Progress Notes (Signed)
Occupational Therapy Treatment Patient Details Name: Richard [name] MRN: [id number] DOB: May 06, 1934 Today's Date: 10/30/2013    History of present illness 79 yo male s/p LAR diverting ileostomy bladder repair 11/12/2013. Hx of uretal cancer, kyphoscoliosis.    OT comments  Pt with decreased pain today but limited by nausea  Follow Up Recommendations  SNF (depending upon progress) May progress to HHOT--limited by nausea today   Equipment Recommendations   (to be further assessed; possibly 3:1 commode)    Recommendations for Other Services      Precautions / Restrictions Precautions Precautions: Fall Precaution Comments: ostomy, drain       Mobility Bed Mobility       Sidelying to sit: Min guard       General bed mobility comments: rolled with light min A; used rail to sit up. Extra time as pt was sleepy  Transfers     Transfers: Sit to/from Stand Sit to Stand: Min guard         General transfer comment: for safety; cues for hand placement and bed elevated    Balance   Sitting-balance support: No upper extremity supported;Feet supported   Sitting balance - Comments: good sitting balance today       Standing balance comment: pt unsteady standing at sink; light min A given.  Pt unsteady posteriorly and to R                    ADL Overall ADL's : Needs assistance/impaired     Grooming: Oral care;Minimal assistance;Standing                   Toilet Transfer: Minimal assistance;Stand-pivot (recliner)             General ADL Comments: Pt was premedicated; I woke him up.  Pt stood at sink to brush teeth and needed min A for balance.  He became nauseated and performed SPT to chair.  Stood briefly before this so that NT could get his weight      Vision                     Perception     Praxis      Cognition   Behavior During Therapy: [facility] for tasks assessed/performed Overall Cognitive Status: Within Functional Limits for  tasks assessed (remains impulsive at times)                       Extremity/Trunk Assessment               Exercises     Shoulder Instructions       General Comments      Pertinent Vitals/ Pain       Abdominal pain reported by not rated; pt was premedicated.  No grimacing, c/o pain with movement today.  Pt was nauseas after standing at sink for several minutes--RN notified  Home Living                                          Prior Functioning/Environment              Frequency Min 2X/week     Progress Toward Goals  OT Goals(current goals can now be found in the care plan section)  Progress towards OT goals: Progressing toward goals     Plan  Co-evaluation                 End of Session     Activity Tolerance  (limited by nausea)   Patient Left in chair;with call bell/phone within reach;with family/visitor present;with chair alarm set   Nurse Communication  (nausea)        Time: 1747-1595 OT Time Calculation (min): 27 min  Charges: OT General Charges $OT Visit: 1 Procedure OT Treatments $Self Care/Home Management : 23-37 mins  Richard [name] 10/30/2013, 11:04 AM Richard [name], OTR/L [phone] 10/30/2013

## 2013-10-31 ENCOUNTER — Inpatient Hospital Stay (HOSPITAL_COMMUNITY): Payer: Medicare Other

## 2013-10-31 DIAGNOSIS — R6520 Severe sepsis without septic shock: Secondary | ICD-10-CM

## 2013-10-31 DIAGNOSIS — G9340 Encephalopathy, unspecified: Secondary | ICD-10-CM

## 2013-10-31 DIAGNOSIS — N179 Acute kidney failure, unspecified: Secondary | ICD-10-CM

## 2013-10-31 DIAGNOSIS — A419 Sepsis, unspecified organism: Secondary | ICD-10-CM

## 2013-10-31 DIAGNOSIS — J441 Chronic obstructive pulmonary disease with (acute) exacerbation: Secondary | ICD-10-CM

## 2013-10-31 DIAGNOSIS — C20 Malignant neoplasm of rectum: Secondary | ICD-10-CM

## 2013-10-31 LAB — CBC
HCT: 28.2 % — ABNORMAL LOW (ref 39.0–52.0)
Hemoglobin: 8.9 g/dL — ABNORMAL LOW (ref 13.0–17.0)
MCH: 29.4 pg (ref 26.0–34.0)
MCHC: 31.6 g/dL (ref 30.0–36.0)
MCV: 93.1 fL (ref 78.0–100.0)
PLATELETS: 162 10*3/uL (ref 150–400)
RBC: 3.03 MIL/uL — AB (ref 4.22–5.81)
RDW: 17.7 % — ABNORMAL HIGH (ref 11.5–15.5)
WBC: 12.3 10*3/uL — AB (ref 4.0–10.5)

## 2013-10-31 LAB — CBC WITH DIFFERENTIAL/PLATELET
Basophils Absolute: 0 10*3/uL (ref 0.0–0.1)
Basophils Relative: 0 % (ref 0–1)
Eosinophils Absolute: 0 10*3/uL (ref 0.0–0.7)
Eosinophils Relative: 0 % (ref 0–5)
HCT: 31.9 % — ABNORMAL LOW (ref 39.0–52.0)
Hemoglobin: 10 g/dL — ABNORMAL LOW (ref 13.0–17.0)
LYMPHS PCT: 8 % — AB (ref 12–46)
Lymphs Abs: 0.4 10*3/uL — ABNORMAL LOW (ref 0.7–4.0)
MCH: 28.9 pg (ref 26.0–34.0)
MCHC: 31.3 g/dL (ref 30.0–36.0)
MCV: 92.2 fL (ref 78.0–100.0)
MONOS PCT: 12 % (ref 3–12)
Monocytes Absolute: 0.6 10*3/uL (ref 0.1–1.0)
NEUTROS ABS: 3.7 10*3/uL (ref 1.7–7.7)
NEUTROS PCT: 80 % — AB (ref 43–77)
Platelets: 173 10*3/uL (ref 150–400)
RBC: 3.46 MIL/uL — ABNORMAL LOW (ref 4.22–5.81)
RDW: 17.6 % — ABNORMAL HIGH (ref 11.5–15.5)
WBC Morphology: INCREASED
WBC: 4.6 10*3/uL (ref 4.0–10.5)

## 2013-10-31 LAB — CREATININE, SERUM
CREATININE: 0.88 mg/dL (ref 0.50–1.35)
GFR calc Af Amer: >90 mL/min (ref >90)
GFR, EST NON AFRICAN AMERICAN: 81 mL/min — AB (ref >90)

## 2013-10-31 LAB — BASIC METABOLIC PANEL
ANION GAP: 12 (ref 5–15)
BUN: 24 mg/dL — ABNORMAL HIGH (ref 6–23)
CHLORIDE: 105 meq/L (ref 96–112)
CO2: 22 meq/L (ref 19–32)
Calcium: 8.2 mg/dL — ABNORMAL LOW (ref 8.4–10.5)
Creatinine, Ser: 2.08 mg/dL — ABNORMAL HIGH (ref 0.50–1.35)
GFR calc non Af Amer: 29 mL/min — ABNORMAL LOW (ref >90)
GFR, EST AFRICAN AMERICAN: 34 mL/min — AB (ref >90)
Glucose, Bld: 163 mg/dL — ABNORMAL HIGH (ref 70–99)
Potassium: 5.4 mEq/L — ABNORMAL HIGH (ref 3.7–5.3)
Sodium: 139 mEq/L (ref 137–147)

## 2013-10-31 LAB — LACTIC ACID, PLASMA: LACTIC ACID, VENOUS: 5.1 mmol/L — AB (ref 0.5–2.2)

## 2013-10-31 LAB — POTASSIUM: Potassium: 4.8 mEq/L (ref 3.7–5.3)

## 2013-10-31 LAB — MRSA PCR SCREENING: MRSA BY PCR: NEGATIVE

## 2013-10-31 MED ORDER — FUROSEMIDE 10 MG/ML IJ SOLN
20.0000 mg | Freq: Once | INTRAMUSCULAR | Status: AC
  Administered 2013-10-31: 20 mg via INTRAVENOUS
  Filled 2013-10-31: qty 2

## 2013-10-31 MED ORDER — IPRATROPIUM-ALBUTEROL 0.5-2.5 (3) MG/3ML IN SOLN
3.0000 mL | RESPIRATORY_TRACT | Status: AC
  Filled 2013-10-31: qty 3

## 2013-10-31 MED ORDER — SODIUM CHLORIDE 0.9 % IV SOLN
1.0000 g | Freq: Every day | INTRAVENOUS | Status: DC
  Administered 2013-10-31 – 2013-11-06 (×7): 1 g via INTRAVENOUS
  Filled 2013-10-31 (×8): qty 1

## 2013-10-31 MED ORDER — IPRATROPIUM-ALBUTEROL 0.5-2.5 (3) MG/3ML IN SOLN
3.0000 mL | RESPIRATORY_TRACT | Status: DC
Start: 2013-10-31 — End: 2013-11-01
  Administered 2013-10-31 (×4): 3 mL via RESPIRATORY_TRACT
  Filled 2013-10-31 (×4): qty 3

## 2013-10-31 MED ORDER — HALOPERIDOL LACTATE 5 MG/ML IJ SOLN
INTRAMUSCULAR | Status: AC
Start: 2013-10-31 — End: 2013-11-01
  Administered 2013-11-01: 5 mg
  Filled 2013-10-31: qty 1

## 2013-10-31 MED ORDER — ERTAPENEM SODIUM 1 G IJ SOLR
1.0000 g | INTRAMUSCULAR | Status: DC
  Filled 2013-10-31: qty 1

## 2013-10-31 MED ORDER — LACTATED RINGERS IV BOLUS (SEPSIS)
1000.0000 mL | Freq: Once | INTRAVENOUS | Status: AC
  Administered 2013-10-31: 1000 mL via INTRAVENOUS

## 2013-10-31 MED ORDER — SODIUM CHLORIDE 0.9 % IV BOLUS (SEPSIS)
1000.0000 mL | Freq: Once | INTRAVENOUS | Status: AC
  Administered 2013-10-31: 1000 mL via INTRAVENOUS

## 2013-10-31 MED ORDER — HALOPERIDOL LACTATE 5 MG/ML IJ SOLN
5.0000 mg | Freq: Once | INTRAMUSCULAR | Status: AC
  Administered 2013-10-31: 5 mg via INTRAVENOUS

## 2013-10-31 MED ORDER — SODIUM CHLORIDE 0.9 % IV SOLN
INTRAVENOUS | Status: DC
  Administered 2013-10-31: 150 mL/h via INTRAVENOUS
  Administered 2013-11-01: 08:00:00 via INTRAVENOUS

## 2013-10-31 NOTE — Significant Event (Addendum)
Rapid Response Event Note  Overview:      Initial Focused Assessment: Respiratory   Interventions: Monitored and called Surgeon on call   Event Summary:RRT called to 1531 by RT. Upon arrival pt supine in bed with noted large distended abdomen and increased RR. Placed on monitor: SBP 88-93, DBP 62-64, HR 114 NSR, RR 40-42, Sats 96% on Roby 2.5 L. Lungs with wheezes and rhonchi. Abdomen firm, distended and no bowel sounds auscultated. Urine dark amber and minimal. C/o sharp mid abdominal pain consistently - unchanged from this am. Fentanyl 25 mcg IVP given. Call placed to Dr Marcello Moores on call Surgeon for orders. Returned call and orders received. Ordered 2 view abdomen and 1 view chest for equivalent of Acute abd. Series per radiology. Bedside RN will wait for results and call to Dr Marcello Moores.   at      at          [place], [place]

## 2013-10-31 NOTE — Progress Notes (Signed)
Physical Therapy Treatment Patient Details Name: Richard [name] MRN: [id number] DOB: 15-Feb-1935 Today's Date: 10/31/2013    History of Present Illness 79 yo male s/p LAR diverting ileostomy bladder repair 11/08/2013. Hx of uretal cancer, kyphoscoliosis.     PT Comments    Not progressing well this session. Pt required increased assistance today. Only able to tolerate ~50 feet ambulation distance. Dyspnea 2-3/4. Mobility limited by pain, fatigue. At this time, feel pt may need ST rehab placement for continued rehab. Will continue to follow and assess.   Follow Up Recommendations  SNF     Equipment Recommendations  Rolling walker with 5" wheels    Recommendations for Other Services OT consult     Precautions / Restrictions Precautions Precautions: Fall Precaution Comments: ostomy, drain Restrictions Weight Bearing Restrictions: No    Mobility  Bed Mobility Overal bed mobility: Needs Assistance Bed Mobility: Rolling;Sidelying to Sit Rolling: Min assist Sidelying to sit: Min assist       General bed mobility comments: Assist for trunk to upright. Increased time.   Transfers Overall transfer level: Needs assistance Equipment used: Rolling walker (2 wheeled) Transfers: Sit to/from Stand Sit to Stand: From elevated surface;Min assist         General transfer comment: Assist to rise, stabilize, control descent. VCs safety, technique, hand placement.   Ambulation/Gait Ambulation/Gait assistance: Min assist Ambulation Distance (Feet): 50 Feet Assistive device: Rolling walker (2 wheeled) Gait Pattern/deviations: Trunk flexed;Decreased stride length;Step-through pattern     General Gait Details: assist to stabilize throughout ambulation. fatigues easily. Dyspnea 2-3/4 with short ambulation distance. Followed with recliner   Stairs            Wheelchair Mobility    Modified Rankin (Stroke Patients Only)       Balance                                     Cognition Arousal/Alertness: Awake/alert Behavior During Therapy: WFL for tasks assessed/performed Overall Cognitive Status: Within Functional Limits for tasks assessed                      Exercises      General Comments        Pertinent Vitals/Pain 9/10 abdomen. Pt appears to be very uncomfortable. Made RN aware. Pt was medicated prior to session.     Home Living                      Prior Function            PT Goals (current goals can now be found in the care plan section) Progress towards PT goals: Not progressing toward goals - comment (limited by pain, fatigue this session)    Frequency  Min 3X/week    PT Plan Current plan remains appropriate    Co-evaluation             End of Session   Activity Tolerance: Patient limited by fatigue;Patient limited by pain Patient left: in chair;with call bell/phone within reach;with chair alarm set     Time: 0920-0940 PT Time Calculation (min): 20 min  Charges:  $Gait Training: 8-22 mins                    G Codes:      Weston Anna, MPT Pager: [phone]

## 2013-10-31 NOTE — Progress Notes (Signed)
Assisted pt up in hall w/ walker.  Pt ambulated approx 200 feet and became SOB w/ skin color slightly ashen.  Pt back in bed w/ VSS- sats 98% ra immediately after ambulation.  Lungs clear. UOP decreased.

## 2013-10-31 NOTE — Progress Notes (Signed)
3 Days Post-Op  Subjective: Pt with some con't nausea.   "breathing fast" this AM  Objective: Vital signs in last 24 hours: Temp:  [99.6 F (37.6 C)-102.4 F (39.1 C)] 102.4 F (39.1 C) (07/16 2200) Pulse Rate:  [81-108] 108 (07/16 2200) Resp:  [15-16] 16 (07/16 2200) BP: (128-144)/(64-68) 128/68 mmHg (07/16 2200) SpO2:  [98 %-99 %] 98 % (07/16 2200) Weight:  [163 lb 12.8 oz (74.299 kg)] 163 lb 12.8 oz (74.299 kg) (07/16 1024) Last BM Date: 10/30/13  Intake/Output from previous day: 07/16 0701 - 07/17 0700 In: 1635 [P.O.:60; I.V.:1575] Out: 1600 [Urine:1075; Drains:175; Stool:350] Intake/Output this shift: Total I/O In: 435 [P.O.:60; I.V.:375] Out: 710 [Urine:300; Drains:60; Stool:350]  General appearance: alert and cooperative Resp: wheezes bilaterally Cardio: regular rate and rhythm, S1, S2 normal, no murmur, click, rub or gallop GI: soft, approp ttp, hypoactive BS, distended, ostomy pink/patent, wounds c/d/i, JP SS Incision/Wound: c/d/i  Lab Results:   Recent Labs  10/30/13 1044 10/31/13 0440  WBC 13.4* 12.3*  HGB 8.9* 8.9*  HCT 27.6* 28.2*  PLT 167 162   BMET  Recent Labs  10/29/13 0515 10/31/13 0440  NA 139  --   K 4.3 4.8  CL 105  --   CO2 26  --   GLUCOSE 131*  --   BUN 9  --   CREATININE 0.90 0.88  CALCIUM 7.9*  --     Anti-infectives: Anti-infectives   Start     Dose/Rate Route Frequency Ordered Stop   11/06/2013 2200  cefoTEtan (CEFOTAN) 2 g in dextrose 5 % 50 mL IVPB     2 g 100 mL/hr over 30 Minutes Intravenous Every 12 hours 11/06/2013 1639 10/23/2013 2203   11/12/2013 0545  clindamycin (CLEOCIN) 900 mg, gentamicin (GARAMYCIN) 240 mg in sodium chloride 0.9 % 1,000 mL for intraperitoneal lavage  Status:  Discontinued    Comments:  Pharmacy may adjust dosing strength, schedule, rate of infusion, etc as needed to optimize therapy    Intraperitoneal To Surgery 10/21/2013 0536 10/31/2013 1625   10/21/2013 0536  cefoTEtan (CEFOTAN) 2 g in dextrose 5 % 50  mL IVPB     2 g 100 mL/hr over 30 Minutes Intravenous On call to O.R. 10/20/2013 0536 10/27/2013 1344      Assessment/Plan: s/p Procedure(s): MINIMALLY  INVASIVE ROBOT /LAPAROSCOPIC  REMOVAL OF PARTIAL COLON AND RECTUM LOW ANTERIOR RESECTION DIVERTING LOOP ILEOSTOMY RIGID PROTOSCOPY  (N/A) Place NGT for ileus OOBTC and PT for ambulation Schedule Duoneb breathing treatements Await bowel function JP SS and output decreasing, con't for now Con't Foley x total of 7 days  LOS: 3 days    Rosario Jacks., [facility] 10/31/2013

## 2013-10-31 NOTE — Progress Notes (Signed)
Dr Rosendo Gros notified of decreased UOP from foley today- order for Lasix IV given and administered. Pt tachypneic and tachycardic.  Resp therapist called to assess pt. Pt alert and oriented but irritable.  Assisted pt to a sitting position in bed.

## 2013-10-31 NOTE — Progress Notes (Signed)
Current Sp02 98% 2 lpm [place]- RN aware.

## 2013-10-31 NOTE — Progress Notes (Signed)
RT paged to PT room via RN. PT remains tachypnic (RR 30's), complaining of SOB and pain level of 10. RN is aware of these symptoms and has called MD. RT spoke with Rapid Response RN and she will come eval PT. RT does not feel this is a problem that neb tx will resolve- has had Q4 tx today without change. PT family states he is severly bloated RN aware. Current BP 94/67- RN aware.

## 2013-10-31 NOTE — Progress Notes (Addendum)
RT unable to electronically transfer blood gas results to EPIC.  ABG results are temp corrected (98.50F) and as follows:  Ph: 7.434 Pco2: 28.9 Po2: 63.9 Hco3: 19.0  Pt was on room air when abg obtained.  Dr. Dario Guardian was given a copy of these results.

## 2013-10-31 NOTE — Progress Notes (Signed)
eLink Physician-Brief Progress Note Patient Name: Richard [name] DOB: [birth year] MRN: [id number]  Date of Service  10/31/2013   HPI/Events of Note  Call from nurse that paitent is now agitated and striking out at staff.  Security has been called.  According to spouse patient has ETOH history.  Currently is hypotensive by cuff pressures.  QTc on EKG is less than 500.   eICU Interventions  Plan: Haldol 5 mg IV times one dose now In 30 minutes if patient remains agitated give 10 mg IV haldol - nurse to contact [facility] MD if patient remains agitated. 12 lead EKG in AM.   Intervention Category Major Interventions: Delirium, psychosis, severe agitation - evaluation and management  DETERDING,ELIZABETH 10/31/2013, 10:25 PM

## 2013-10-31 NOTE — Progress Notes (Signed)
eLink Physician-Brief Progress Note Patient Name: Richard [name] DOB: 1934-08-31 MRN: [id number]  Date of Service  10/31/2013   HPI/Events of Note  Patient on surgery service transferred to stepdown for ongoing hypotension in the setting of concern for potential anastomotic leak vs other.  He now has oliguria not responding to lasix, hypotension with MAP in the 50s, tachypenia, and tachycardia.  BUN/creat are up with K of 5.4.  Hgb is stable at 10.0 with WBC of 4.6.   eICU Interventions  Plan: Check lactate, ABG 1 liter of NS for BP support Aline Consider NEO for BP support To be seen by PCCM fellow   Intervention Category Major Interventions: Hypotension - evaluation and management  Nasirah Sachs 10/31/2013, 10:03 PM

## 2013-10-31 NOTE — Progress Notes (Signed)
Clinical Social Work Department BRIEF PSYCHOSOCIAL ASSESSMENT 10/31/2013  Patient:  Nicolson,Ranjit     Account Number:  [id number]     Admit date:  10/19/2013  Clinical Social Worker:  Lacie Scotts  Date/Time:  10/31/2013 03:33 PM  Referred by:  CSW  Date Referred:  10/31/2013 Referred for  SNF Placement   Other Referral:   Interview type:  Patient Other interview type:    PSYCHOSOCIAL DATA Living Status:  WIFE Admitted from facility:   Level of care:   Primary support name:  [name] Primary support relationship to patient:  SPOUSE Degree of support available:   supportive    CURRENT CONCERNS Current Concerns  Post-Acute Placement   Other Concerns:    SOCIAL WORK ASSESSMENT / PLAN Pt is a 79 yr old gentleman living at home prior to hospitalization. CSW met with pt / spouse to assist with d/c planning. PN reviewed. PT is recommending SNF placement at d/c unless pt's status improves. Pt remains medically unstable. Pt / spouse would like to give pt a little more time, to hopefully improve, prior to making d/c planning decisions. CSW will continue to follow pt's progress and return to assist with d/c planning.   Assessment/plan status:  Psychosocial Support/Ongoing Assessment of Needs Other assessment/ plan:   Information/referral to community resources:   none needed at this time.    PATIENT'S/FAMILY'S RESPONSE TO PLAN OF CARE: D/C planning is ongoing. No definite plan in place at this time. CSW will continue to follow.   Werner Lean LCSW [phone]

## 2013-10-31 NOTE — Progress Notes (Signed)
Pt is confused and unable to sign consent for aline.  Per RN, patient's wife will consider this and call her back.  RN will advise RT if/when patient's wife gives [place] permission to place aline.

## 2013-10-31 NOTE — Progress Notes (Signed)
Called by rapid response RN for pt with hypotension, tachycardia, tachypnea.  Given lasix today for low UOP with no response.  Ordered bolus LR, CXR, AXR, CBC and Chemistry.  Cr elevated and labs appear hemo concentrated.  AXR concerning for possible anastomotic leak.  Abd exam reveal distention with tenderness in lowe abd.  JP with feculent output.  Concerning for anastomotic leak with sepsis.  Will start Invanz and start 0.9 NS at 192ml/h.  Xfer to step down.  Will consult CCM for further assistance with his management.  Given he is diverted, I feel that this can be controlled with medical management at this time.

## 2013-10-31 NOTE — Consult Note (Signed)
WOC ostomy follow up Enrolled patient in [place] Start Discharge program: Yes [facility] nursing team will continue to follow, and will remain available to this patient, the nursing, surgical and medical teams.  Please re-consult if needed. Thanks, Maudie Flakes, MSN, RN, [place], [place], [place] [phone])

## 2013-11-01 DIAGNOSIS — A419 Sepsis, unspecified organism: Secondary | ICD-10-CM

## 2013-11-01 DIAGNOSIS — R6520 Severe sepsis without septic shock: Secondary | ICD-10-CM

## 2013-11-01 DIAGNOSIS — G9340 Encephalopathy, unspecified: Secondary | ICD-10-CM

## 2013-11-01 DIAGNOSIS — C20 Malignant neoplasm of rectum: Secondary | ICD-10-CM

## 2013-11-01 DIAGNOSIS — J441 Chronic obstructive pulmonary disease with (acute) exacerbation: Secondary | ICD-10-CM

## 2013-11-01 DIAGNOSIS — N179 Acute kidney failure, unspecified: Secondary | ICD-10-CM

## 2013-11-01 LAB — CBC
HCT: 27.6 % — ABNORMAL LOW (ref 39.0–52.0)
Hemoglobin: 8.9 g/dL — ABNORMAL LOW (ref 13.0–17.0)
MCH: 29.8 pg (ref 26.0–34.0)
MCHC: 32.2 g/dL (ref 30.0–36.0)
MCV: 92.3 fL (ref 78.0–100.0)
PLATELETS: 138 10*3/uL — AB (ref 150–400)
RBC: 2.99 MIL/uL — ABNORMAL LOW (ref 4.22–5.81)
RDW: 17.6 % — AB (ref 11.5–15.5)
WBC: 10.1 10*3/uL (ref 4.0–10.5)

## 2013-11-01 LAB — BLOOD GAS, ARTERIAL
Acid-base deficit: 4.4 mmol/L — ABNORMAL HIGH (ref 0.0–2.0)
BICARBONATE: 18.9 meq/L — AB (ref 20.0–24.0)
Drawn by: 232811
FIO2: 0.21 %
O2 SAT: 95.1 %
PATIENT TEMPERATURE: 98.9
TCO2: 17.8 mmol/L (ref 0–100)
pCO2 arterial: 30 mmHg — ABNORMAL LOW (ref 35.0–45.0)
pH, Arterial: 7.416 (ref 7.350–7.450)
pO2, Arterial: 75.8 mmHg — ABNORMAL LOW (ref 80.0–100.0)

## 2013-11-01 LAB — CREATININE, SERUM
Creatinine, Ser: 2.63 mg/dL — ABNORMAL HIGH (ref 0.50–1.35)
GFR, EST AFRICAN AMERICAN: 26 mL/min — AB (ref >90)
GFR, EST NON AFRICAN AMERICAN: 22 mL/min — AB (ref >90)

## 2013-11-01 LAB — GLUCOSE, CAPILLARY
GLUCOSE-CAPILLARY: 90 mg/dL (ref 70–99)
GLUCOSE-CAPILLARY: 157 mg/dL — AB (ref 70–99)
Glucose-Capillary: 139 mg/dL — ABNORMAL HIGH (ref 70–99)
Glucose-Capillary: 150 mg/dL — ABNORMAL HIGH (ref 70–99)

## 2013-11-01 LAB — LACTIC ACID, PLASMA: Lactic Acid, Venous: 2.7 mmol/L — ABNORMAL HIGH (ref 0.5–2.2)

## 2013-11-01 LAB — POTASSIUM: POTASSIUM: 5.2 meq/L (ref 3.7–5.3)

## 2013-11-01 MED ORDER — IPRATROPIUM-ALBUTEROL 0.5-2.5 (3) MG/3ML IN SOLN
3.0000 mL | Freq: Three times a day (TID) | RESPIRATORY_TRACT | Status: DC
  Administered 2013-11-01: 3 mL via RESPIRATORY_TRACT
  Filled 2013-11-01: qty 3

## 2013-11-01 MED ORDER — METHYLPREDNISOLONE SODIUM SUCC 40 MG IJ SOLR
20.0000 mg | Freq: Two times a day (BID) | INTRAMUSCULAR | Status: DC
  Administered 2013-11-01 – 2013-11-03 (×5): 20 mg via INTRAVENOUS
  Filled 2013-11-01 (×5): qty 1

## 2013-11-01 MED ORDER — THIAMINE HCL 100 MG/ML IJ SOLN
100.0000 mg | Freq: Every day | INTRAMUSCULAR | Status: DC
  Administered 2013-11-01 – 2013-11-04 (×4): 100 mg via INTRAVENOUS
  Filled 2013-11-01 (×5): qty 2

## 2013-11-01 MED ORDER — PANTOPRAZOLE SODIUM 40 MG IV SOLR
40.0000 mg | INTRAVENOUS | Status: DC
  Administered 2013-11-01 – 2013-11-04 (×4): 40 mg via INTRAVENOUS
  Filled 2013-11-01 (×5): qty 40

## 2013-11-01 MED ORDER — INSULIN ASPART 100 UNIT/ML ~~LOC~~ SOLN
0.0000 [IU] | SUBCUTANEOUS | Status: DC
  Administered 2013-11-01: 2 [IU] via SUBCUTANEOUS
  Administered 2013-11-01 – 2013-11-02 (×4): 1 [IU] via SUBCUTANEOUS
  Administered 2013-11-02 (×2): 2 [IU] via SUBCUTANEOUS
  Administered 2013-11-02 – 2013-11-04 (×10): 1 [IU] via SUBCUTANEOUS

## 2013-11-01 MED ORDER — HALOPERIDOL LACTATE 5 MG/ML IJ SOLN: 1.0000 mg | INTRAMUSCULAR | Status: DC | PRN

## 2013-11-01 MED ORDER — IPRATROPIUM-ALBUTEROL 0.5-2.5 (3) MG/3ML IN SOLN
3.0000 mL | Freq: Four times a day (QID) | RESPIRATORY_TRACT | Status: DC
  Administered 2013-11-01 – 2013-11-04 (×12): 3 mL via RESPIRATORY_TRACT
  Filled 2013-11-01 (×11): qty 3

## 2013-11-01 MED ORDER — SODIUM CHLORIDE 0.9 % IV BOLUS (SEPSIS)
500.0000 mL | Freq: Once | INTRAVENOUS | Status: AC
  Administered 2013-11-01: 500 mL via INTRAVENOUS

## 2013-11-01 MED ORDER — ALBUTEROL SULFATE (2.5 MG/3ML) 0.083% IN NEBU
2.5000 mg | INHALATION_SOLUTION | RESPIRATORY_TRACT | Status: DC | PRN
  Administered 2013-11-06 (×2): 2.5 mg via RESPIRATORY_TRACT
  Filled 2013-11-01 (×2): qty 3

## 2013-11-01 MED ORDER — BUDESONIDE 0.5 MG/2ML IN SUSP
0.5000 mg | Freq: Two times a day (BID) | RESPIRATORY_TRACT | Status: DC
  Administered 2013-11-01 – 2013-11-05 (×9): 0.5 mg via RESPIRATORY_TRACT
  Filled 2013-11-01 (×11): qty 2

## 2013-11-01 MED ORDER — BUDESONIDE 0.25 MG/2ML IN SUSP: 0.2500 mg | Freq: Two times a day (BID) | RESPIRATORY_TRACT | Status: DC

## 2013-11-01 MED ORDER — DEXTROSE-NACL 5-0.9 % IV SOLN
INTRAVENOUS | Status: DC
  Administered 2013-11-01 – 2013-11-03 (×5): via INTRAVENOUS

## 2013-11-01 MED ORDER — FOLIC ACID 5 MG/ML IJ SOLN
1.0000 mg | Freq: Every day | INTRAMUSCULAR | Status: DC
  Administered 2013-11-01 – 2013-11-04 (×4): 1 mg via INTRAVENOUS
  Filled 2013-11-01 (×7): qty 0.2

## 2013-11-01 NOTE — Procedures (Signed)
Arterial Catheter Insertion Procedure Note Miracle Criado [id number] Dec 11, 1934  Procedure: Insertion of Arterial Catheter  Indications: Blood pressure monitoring  Procedure Details Consent: Risks of procedure as well as the alternatives and risks of each were explained to the (patient/caregiver).  Consent for procedure obtained. Time Out: Verified patient identification, verified procedure, site/side was marked, verified correct patient position, special equipment/implants available, medications/allergies/relevent history reviewed, required imaging and test results available.  Performed  Maximum sterile technique was used including antiseptics, cap, gloves, gown, hand hygiene, mask and sheet. Skin prep: Chlorhexidine; local anesthetic administered 20 gauge catheter was inserted into right radial artery using the Seldinger technique.  Evaluation Blood flow good; BP tracing good. Complications: No apparent complications.   Camerin Jimenez P 11/01/2013

## 2013-11-01 NOTE — Progress Notes (Signed)
Evaluated pt after report, lungs coarse rhonchi, NG draining green bile, flushed and applied stop cock to line, abd distended, no urine output from bolus.  Family present and anxious.  Pt A&O and pleasant.  C/o abd pain and thirst.  Awaiting the rest of the lab results to call labs and xray results to Dr. Marcello Moores when she arrived on floor.  Order received to move pt to SDU.  Charge nurse notified. Will call report and move pt to SDU. Kirsta Probert P  .

## 2013-11-01 NOTE — Consult Note (Addendum)
PULMONARY  / [place]   Name: Richard [name] MRN: [id number] DOB: [birth year]    ADMISSION DATE:  10/15/2013 CONSULTATION DATE: 10/31/13  REQUESTING CLINICIAN: Marcello Moores PRIMARY SERVICE: Gen surgery  CHIEF COMPLAINT/ REASON FOR CONSULT:  Hypotension, Tachypnea.    BRIEF PATIENT DESCRIPTION: 16yom with h/o rectal cancer s/p neoadjuvant chemoradiation.  Admitted 7/14 for removal of rectal mass, now with diverting loop ileostomy and bladder repair.  Was doing well until the evening of 7/17 when noted to be increasingly tachypneic and hypotensive prompting transfer to stepdown.  SIGNIFICANT EVENTS / STUDIES:  7/14>> Surgical Removal of rectal tumor with resultant loop ileostomy and bladder repair 7/17>> Acutely hypotensive, tachypneic  LINES / TUBES: L PICC  CULTURES: Blood cx x 2 (PICC and peripheral) drawn on 7/17: pending  ANTIBIOTICS: Ertapenem 1G qhs, started 7/17  HISTORY OF PRESENT ILLNESS:  78yom with h/o rectal cancer s/p neoadjuvant chemoradiation.  Admitted 7/14 for removal of rectal mass, now with diverting loop ileostomy and bladder repair.  Was doing well until earlier today when he was noted to be increasingly tachypneic with concurrent hypotension (maps in 50's).  Also noted to have increasingly abdominal swelling prompting a KUB with results concerning for anastomotic leak.  He was started on ertapenem and given 1L LR bolus with good response in BP with Maps increasing to 70's.  He was then transferred to stepdown and we were consulted for further assistance with w/u and management.  Also was quite agitated and pulled out his NGT during this period requiring dose of IV haldol and restraints.  Pt was sedated at time of exam limiting his ability to provide further history.    PAST MEDICAL HISTORY :  Past Medical History  Diagnosis Date  . Rectal adenocarcinoma (7cm posterior) 06/16/2013  . Kyphoscoliosis deformity of spine   . History of radiation  therapy 07/14/13-08/20/13    rectal, 50.4Gy/  . Psoriasis     SCALP  . Hemorrhoids   . History of transfusion   . Anemia of chronic disease 10/20/2013   Past Surgical History  Procedure Laterality Date  . I&d extremity  06/07/2011    Procedure: IRRIGATION AND DEBRIDEMENT EXTREMITY;  Surgeon: Linna Hoff, MD;  Location: [facility];  Service: Orthopedics;  Laterality: Left;  . Amputation  06/07/2011    Procedure: AMPUTATION DIGIT;  Surgeon: Linna Hoff, MD;  Location: [facility];  Service: Orthopedics;  Laterality: Left;  Revision multiple fingers index and middle   . Eus N/A 06/25/2013    Procedure: LOWER ENDOSCOPIC ULTRASOUND (EUS);  Surgeon: Arta Silence, MD;  Location: [facility];  Service: Endoscopy;  Laterality: N/A;  . Colonoscopy w/ biopsies  05/29/13  . Eus  06/25/13   Prior to Admission medications   Medication Sig Start Date End Date Taking? Authorizing Provider  Multiple Vitamin (MULTIVITAMIN) tablet Take 1 tablet by mouth daily.   Yes Historical Provider, MD  oxyCODONE (OXY IR/ROXICODONE) 5 MG immediate release tablet Take 1-2 tablets (5-10 mg total) by mouth every 4 (four) hours as needed for moderate pain, severe pain or breakthrough pain. 11/09/2013   Adin Hector, MD   No Known Allergies  FAMILY HISTORY:  Family History  Problem Relation Age of Onset  . Lung cancer Father   . Kidney cancer Brother   . Cancer - Other Mother     "upper stomach"  . Lung cancer Sister    SOCIAL HISTORY:  Reviewed and obtained from chart review as pt unable to provide  Approx 65 pack year smoking hx.  Cont to smoke.  Also with significant alcohol usage per his wife. Last drink 7/11  REVIEW OF SYSTEMS:  Unable to obtain 2/2 AMS    VITAL SIGNS:  Filed Vitals:   11/01/13 0000  BP: 103/51  Pulse: 107  Temp: 99.5 F (37.5 C)  Resp: 35    VENTILATOR SETTINGS: N/A   INTAKE / OUTPUT: Intake/Output     07/17 0701 - 07/18 0700   I.V. (mL/kg) 535 (7)   IV Piggyback 1050   Total  Intake(mL/kg) 1585 (20.7)   Urine (mL/kg/hr) 105 (0.1)   Emesis/NG output 700 (0.4)   Drains 5 (0)   Total Output 810   Net +775         PHYSICAL EXAMINATION: General:  Frail, sedated male, increased wob, chronically ill appearing Neuro:  Sedated, moves all extremities spontaneously HEENT:  Neck supple without masses or JVD Cardiovascular:  Slightly Tachy, no murmurs Lungs:  Increased WOB, good air movement, scattered exp wheezes, no crackles Abdomen:  Distended, faint BS, Pt grimaced with palpation suggesting tenderness Musculoskeletal:  Moves all extremeties Skin:  No rashes or edema  LABS:  CBC  Recent Labs Lab 10/30/13 1044 10/31/13 0440 10/31/13 1836  WBC 13.4* 12.3* 4.6  HGB 8.9* 8.9* 10.0*  HCT 27.6* 28.2* 31.9*  PLT 167 162 173   BMET  Recent Labs Lab 10/29/13 0515 10/31/13 0440 10/31/13 1836  NA 139  --  139  K 4.3 4.8 5.4*  CL 105  --  105  CO2 26  --  22  BUN 9  --  24*  CREATININE 0.90 0.88 2.08*  GLUCOSE 131*  --  163*   Electrolytes  Recent Labs Lab 10/29/13 0515 10/31/13 1836  CALCIUM 7.9* 8.2*  MG 1.8  --    Sepsis Markers  Recent Labs Lab 10/31/13 2320  LATICACIDVEN 5.1*   ABG  7.435/28.8/63.7 on RA   Imaging Dg Chest Port 1 View  10/31/2013   CLINICAL DATA:  Abdominal pain.  EXAM: PORTABLE CHEST - 1 VIEW  COMPARISON:  June 07, 2011.  FINDINGS: The heart size and mediastinal contours are within normal limits. Left lung is clear. Minimal right lower lobe subsegmental atelectasis or scarring is noted. Nasogastric tube tip is seen in proximal stomach. Interval placement of left-sided PICC line is noted with tip in expected position of cavoatrial junction. No pneumoperitoneum is noted. Old right rib fracture is noted.  IMPRESSION: No pneumoperitoneum is noted. Nasogastric tube tip is seen in proximal stomach. Minimal right lower lobe subsegmental atelectasis or scarring.   Electronically Signed   By: Sabino Dick M.D.   On:  10/31/2013 19:36   Dg Abd Portable 2v  10/31/2013   CLINICAL DATA:  Abdominal pain, distension, laparoscopic partial colectomy on 10/26/2013  EXAM: PORTABLE ABDOMEN - 2 VIEW  COMPARISON:  CT abdomen 06/20/2013  FINDINGS: On the left lateral decubitus view there is nondependent air concerning for pneumoperitoneum. There is gaseous dilatation of the small bowel with a few small bowel air-fluid levels. There is a nasogastric tube with the tip projecting over the stomach.  IMPRESSION: Pneumoperitoneum which may be postsurgical versus related to an anastomotic leak. Gaseous distention of small bowel with a few small bowel air-fluid levels likely reflecting an ileus. Moderate amount of stool throughout the colon. These results were called by telephone at the time of interpretation on 10/31/2013 at 7:37 pm to [facility] ,RN, who verbally acknowledged these results.  There  is a nasogastric tube with the tip projecting over the stomach.   Electronically Signed   By: Kathreen Devoid   On: 10/31/2013 19:37      ASSESSMENT / PLAN:  PULMONARY:  A: Increased WOB but otherwise stable ABG.  Suspect WOB from pain, sepsis and hyperactive delirium P:   Monitor respiratory status for now Pt on RA   CARDIOVASCULAR A: Hypotensive with response to IVF bolus.  Likely 2/2 severe sepsis.  Pt with flat neck veins, source of infection (abdomen), tachypneic, tachycardia.  Also with good response to IVF and no signs of bleeding supporting dx of distributive/ septic etiology of hypotension P:   Abx as below IVF boluses prn.  Pt currently has received 1L Placing Aline for better BP monitoring  RENAL A: Cr elevated to 2.  Likely 2/2 sepsis  P:   S/p 1L.  Cont boluses prn based on BP and UOP  GASTROINTESTINAL A: Likely anastomotic leak P:   Defer management to surgery Agree with current ABX.  Ertapenem should offer excellent GN- and anaerobic coverage.  Low suspicion for MRSA so no coverage necessary at this  time.  HEMATOLOGIC A: H/o anemia of chronic disease.  Stable P:   Will monitor  INFECTIOUS A: Severe sepsis with end organ dysfunction: Hypotension, lactate, AKI, AMS.  Source likely abdominal P:   IV abx as above Fluids as above Blood cx x 2 (PICC and peripheral) pending Repeat lactate in AM with ABG   ENDOCRINE A: No h/o DM but BG's elevated slightly P:   Will monitor for now but cont to stay elevated, will need to add SSI  NEUROLOGIC A: Hyperactive delirium.  Likely 2/2 pain and sepsis.  P:   Treatment of sepsis as above.   Rec using prn fentanyl for pain control as pt clearly grimaces during abdominal exam indicating likely pain Prn haldol 5mg  IV for severe agitation  TODAY'S SUMMARY: 78yom with recent abdominal surgery.  Decompensated today with severe sepsis as likely etiology.  Improved with fluids.  IV Abx started.   I have personally obtained a history, examined the patient, evaluated laboratory and imaging results, formulated the assessment and plan and placed orders.  CRITICAL CARE: The patient is critically ill with multiple organ systems failure and requires high complexity decision making for assessment and support, frequent evaluation and titration of therapies, application of advanced monitoring technologies and extensive interpretation of multiple databases. Critical Care Time devoted to patient care services described in this note is 65 minutes.   Lucrezia Starch, MD Pulmonary and [place] Pager: [phone]   11/01/2013, 12:35 AM

## 2013-11-01 NOTE — Consult Note (Signed)
PULMONARY  / [place]   Name: Richard [name] MRN: [id number] DOB: 1934-12-05    ADMISSION DATE:  11/13/2013 CONSULTATION DATE: 10/31/13  REQUESTING CLINICIAN: Marcello Moores  CHIEF COMPLAINT/ REASON FOR CONSULT:  Hypotension, Tachypnea.    BRIEF PATIENT DESCRIPTION:  79 yo male smoker with hx of rectal cancer s/p admitted for removal of rectal mass on 7/14.  Developed hypotension, tachypnea7/17 and PCCM consulted to assist with management.  He has hx of ETOH abuse.  SIGNIFICANT EVENTS: 7/14 Robotic low anterior resection with diverting loop ileostomy, bladder repair 7/17 Transfer to ICU with hypotension, tachypnea  STUDIES: 7/17 Abd xray >> pneumoperitoneum  LINES / TUBES: L PICC 7/15 >>  Rt radial aline 7/18 >>   CULTURES: Blood 7/17 >>   ANTIBIOTICS: Ertapenem 7/17 >>    SUBJECTIVE: C/o wheezing.  Reports he smokes 1/2 ppd, and drinks 1/5 alcohol bottle per day.  VITAL SIGNS: Temp:  [97.7 F (36.5 C)-99.5 F (37.5 C)] 98.9 F (37.2 C) (07/18 0400) Pulse Rate:  [80-139] 107 (07/18 0900) Resp:  [16-45] 26 (07/18 0900) BP: (81-140)/(46-69) 103/51 mmHg (07/18 0000) SpO2:  [94 %-100 %] 99 % (07/18 0900) Weight:  [161 lb 12.8 oz (73.392 kg)-168 lb 10.4 oz (76.5 kg)] 168 lb 10.4 oz (76.5 kg) (07/17 2150) INTAKE / OUTPUT: Intake/Output     07/17 0701 - 07/18 0700 07/18 0701 - 07/19 0700   P.O. 0    I.V. (mL/kg) 3320 (43.4) 150 (2)   Other  5   NG/GT  30   IV Piggyback 1050    Total Intake(mL/kg) 4370 (57.1) 185 (2.4)   Urine (mL/kg/hr) 105 (0.1)    Emesis/NG output 1350 (0.7) 30 (0.2)   Drains 15 (0) 10 (0.1)   Stool 15 (0) 20 (0.1)   Total Output 1485 60   Net +2885 +125          PHYSICAL EXAMINATION: General: ill appearing, increased WOB Neuro: follows simple commands HEENT: temporal wasting, NG tube in place Cardiovascular: tachycardic, regular, no murmur Lungs: b/l wheeze Abdomen:  Distended, mild tenderness Musculoskeletal: no  edema Skin: no rashes  CBC Recent Labs     10/31/13  0440  10/31/13  1836  11/01/13  0500  WBC  12.3*  4.6  10.1  HGB  8.9*  10.0*  8.9*  HCT  28.2*  31.9*  27.6*  PLT  162  173  138*   BMET Recent Labs     10/31/13  0440  10/31/13  1836  11/01/13  0500  NA   --   139   --   K  4.8  5.4*  5.2  CL   --   105   --   CO2   --   22   --   BUN   --   24*   --   CREATININE  0.88  2.08*  2.63*  GLUCOSE   --   163*   --     Electrolytes Recent Labs     10/31/13  1836  CALCIUM  8.2*   ABG Recent Labs     11/01/13  0410  PHART  7.416  PCO2ART  30.0*  PO2ART  75.8*   Imaging Dg Chest Port 1 View  10/31/2013   CLINICAL DATA:  Abdominal pain.  EXAM: PORTABLE CHEST - 1 VIEW  COMPARISON:  June 07, 2011.  FINDINGS: The heart size and mediastinal contours are within normal limits. Left lung is clear. Minimal right  lower lobe subsegmental atelectasis or scarring is noted. Nasogastric tube tip is seen in proximal stomach. Interval placement of left-sided PICC line is noted with tip in expected position of cavoatrial junction. No pneumoperitoneum is noted. Old right rib fracture is noted.  IMPRESSION: No pneumoperitoneum is noted. Nasogastric tube tip is seen in proximal stomach. Minimal right lower lobe subsegmental atelectasis or scarring.   Electronically Signed   By: Sabino Dick M.D.   On: 10/31/2013 19:36   Dg Abd Portable 2v  10/31/2013   CLINICAL DATA:  Abdominal pain, distension, laparoscopic partial colectomy on 11/13/2013  EXAM: PORTABLE ABDOMEN - 2 VIEW  COMPARISON:  CT abdomen 06/20/2013  FINDINGS: On the left lateral decubitus view there is nondependent air concerning for pneumoperitoneum. There is gaseous dilatation of the small bowel with a few small bowel air-fluid levels. There is a nasogastric tube with the tip projecting over the stomach.  IMPRESSION: Pneumoperitoneum which may be postsurgical versus related to an anastomotic leak. Gaseous distention of small bowel  with a few small bowel air-fluid levels likely reflecting an ileus. Moderate amount of stool throughout the colon. These results were called by telephone at the time of interpretation on 10/31/2013 at 7:37 pm to [facility] ,RN, who verbally acknowledged these results.  There is a nasogastric tube with the tip projecting over the stomach.   Electronically Signed   By: Kathreen Devoid   On: 10/31/2013 19:37    ASSESSMENT / PLAN:  PULMONARY:  A: Acute hypoxic respiratory failure 2nd to presumed AECOPD and sepsis. Tobacco abuse. P:   F/u CXR, ABG Oxygen to keep SpO2 90 to 94% Add pulmicort 7/18 Scheduled BD's Add solumedrol 20 mg q12h 7/18 Bronchial hygiene  Doubt he would tolerate BiPAP >> if respiratory status worse, then likely would need intubation  CARDIOVASCULAR A: Severe sepsis 2nd to peritonitis. P:   Continue IV fluids Pressors as needed to keep SBP > 90, MAP > 65  RENAL A:  Acute kidney injury 2nd to sepsis (baseline creatinine 0.88 from 10/31/13). P:   Optimize hemodynamics Monitor renal fx, urine outpt, electrolytes  GASTROINTESTINAL A: Rectal cancer s/p  low anterior resection with diverting loop ileostomy with post-op course likely complicated by anastomotic leak. Protein calorie malnutrition. P:   Management per surgery >> non operative management at this time NPO Protonix for SUP  HEMATOLOGIC A: Anemia of critical illness and chronic disease. P:   F/u CBC Transfuse for Hb < 7 SQ heparin for DVT prevention  INFECTIOUS A: Severe sepsis likely due to peritonitis. P:   Day 2 eratapenem  ENDOCRINE A: Hyperglycemia. P:   SSI  NEUROLOGIC A: Acute encephalopathy 2nd to sepsis, hypoxia, renal failure. Hx of ETOH. P:   Thiamine, folic acid, dextrose in IV fluid Prn fentanyl for post-op pain control If delirium gets worse, then precedex may be good option with hx of ETOH abuse  CC time 50 minutes  Change to ICU status  Chesley Mires, MD [place] 11/01/2013, 9:18 AM Pager:  [phone] After 3pm call: [phone]

## 2013-11-01 NOTE — Progress Notes (Signed)
4 Days Post-Op  Subjective: Decreased loc with increased wob. Arouses.  Objective: Vital signs in last 24 hours: Temp:  [97.7 F (36.5 C)-99.5 F (37.5 C)] 98.9 F (37.2 C) (07/18 0400) Pulse Rate:  [80-139] 110 (07/18 0600) Resp:  [16-45] 39 (07/18 0700) BP: (81-140)/(46-69) 103/51 mmHg (07/18 0000) SpO2:  [94 %-100 %] 99 % (07/18 0600) Weight:  [161 lb 12.8 oz (73.392 kg)-168 lb 10.4 oz (76.5 kg)] 168 lb 10.4 oz (76.5 kg) (07/17 2150) Last BM Date:  (gas in bag)  Intake/Output from previous day: 07/17 0701 - 07/18 0700 In: 4370 [I.V.:3320; IV Piggyback:1050] Out: 1485 [Urine:105; Emesis/NG output:1350; Drains:15; Stool:15] Intake/Output this shift: Total I/O In: 155 [I.V.:150; Other:5] Out: -   Resp: clear to auscultation bilaterally and tachypneic Cardio: regular rate and rhythm and slightly tachycardic GI: Tender and distended and quiet. ostomy pink with some air and liquid in bag. drain with small amount of dark fluid  Lab Results:   Recent Labs  10/31/13 1836 11/01/13 0500  WBC 4.6 10.1  HGB 10.0* 8.9*  HCT 31.9* 27.6*  PLT 173 138*   BMET  Recent Labs  10/31/13 1836 11/01/13 0500  NA 139  --   K 5.4* 5.2  CL 105  --   CO2 22  --   GLUCOSE 163*  --   BUN 24*  --   CREATININE 2.08* 2.63*  CALCIUM 8.2*  --    PT/INR No results found for this basename: LABPROT, INR,  in the last 72 hours ABG  Recent Labs  11/01/13 0410  PHART 7.416  HCO3 18.9*    Studies/Results: Dg Chest Port 1 View  10/31/2013   CLINICAL DATA:  Abdominal pain.  EXAM: PORTABLE CHEST - 1 VIEW  COMPARISON:  June 07, 2011.  FINDINGS: The heart size and mediastinal contours are within normal limits. Left lung is clear. Minimal right lower lobe subsegmental atelectasis or scarring is noted. Nasogastric tube tip is seen in proximal stomach. Interval placement of left-sided PICC line is noted with tip in expected position of cavoatrial junction. No pneumoperitoneum is noted. Old  right rib fracture is noted.  IMPRESSION: No pneumoperitoneum is noted. Nasogastric tube tip is seen in proximal stomach. Minimal right lower lobe subsegmental atelectasis or scarring.   Electronically Signed   By: Sabino Dick M.D.   On: 10/31/2013 19:36   Dg Abd Portable 2v  10/31/2013   CLINICAL DATA:  Abdominal pain, distension, laparoscopic partial colectomy on 11/14/2013  EXAM: PORTABLE ABDOMEN - 2 VIEW  COMPARISON:  CT abdomen 06/20/2013  FINDINGS: On the left lateral decubitus view there is nondependent air concerning for pneumoperitoneum. There is gaseous dilatation of the small bowel with a few small bowel air-fluid levels. There is a nasogastric tube with the tip projecting over the stomach.  IMPRESSION: Pneumoperitoneum which may be postsurgical versus related to an anastomotic leak. Gaseous distention of small bowel with a few small bowel air-fluid levels likely reflecting an ileus. Moderate amount of stool throughout the colon. These results were called by telephone at the time of interpretation on 10/31/2013 at 7:37 pm to [facility] ,RN, who verbally acknowledged these results.  There is a nasogastric tube with the tip projecting over the stomach.   Electronically Signed   By: Kathreen Devoid   On: 10/31/2013 19:37    Anti-infectives: Anti-infectives   Start     Dose/Rate Route Frequency Ordered Stop   10/31/13 2300  ertapenem (INVANZ) 1 g in sodium chloride 0.9 %  50 mL IVPB     1 g 100 mL/hr over 30 Minutes Intravenous Daily at bedtime 10/31/13 2159     10/31/13 2200  ertapenem [facility]) injection 1 g  Status:  Discontinued     1 g Intramuscular Every 24 hours 10/31/13 2155 10/31/13 2158   10/25/2013 2200  cefoTEtan (CEFOTAN) 2 g in dextrose 5 % 50 mL IVPB     2 g 100 mL/hr over 30 Minutes Intravenous Every 12 hours 11/12/2013 1639 10/31/2013 2203   11/08/2013 0545  clindamycin (CLEOCIN) 900 mg, gentamicin (GARAMYCIN) 240 mg in sodium chloride 0.9 % 1,000 mL for intraperitoneal lavage   Status:  Discontinued    Comments:  Pharmacy may adjust dosing strength, schedule, rate of infusion, etc as needed to optimize therapy    Intraperitoneal To Surgery 10/27/2013 0536 11/14/2013 1625   11/10/2013 0536  cefoTEtan (CEFOTAN) 2 g in dextrose 5 % 50 mL IVPB     2 g 100 mL/hr over 30 Minutes Intravenous On call to O.R. 10/29/2013 0536 11/03/2013 1344      Assessment/Plan: s/p Procedure(s): MINIMALLY  INVASIVE ROBOT /LAPAROSCOPIC  REMOVAL OF PARTIAL COLON AND RECTUM LOW ANTERIOR RESECTION DIVERTING LOOP ILEOSTOMY RIGID PROTOSCOPY  (N/A) Sepsis likely from abdominal source. At this point he is already diverted and drained so I don't think taking him back to OR will add anything Appreciate CCM helping with resuscitation. He will probably need more fluid and may require intubation in the near future AKI. Monitor Cr and continue volume resuscitation Continue Invanz  LOS: 4 days    TOTH III,Ginnette Gates S 11/01/2013

## 2013-11-02 ENCOUNTER — Inpatient Hospital Stay (HOSPITAL_COMMUNITY): Payer: Medicare Other

## 2013-11-02 LAB — BLOOD GAS, ARTERIAL
ACID-BASE DEFICIT: 5.3 mmol/L — AB (ref 0.0–2.0)
Acid-base deficit: 5.8 mmol/L — ABNORMAL HIGH (ref 0.0–2.0)
Bicarbonate: 19.9 mEq/L — ABNORMAL LOW (ref 20.0–24.0)
Bicarbonate: 19.4 mEq/L — ABNORMAL LOW (ref 20.0–24.0)
DRAWN BY: 11249
Drawn by: 307971
O2 CONTENT: 2 L/min
O2 Content: 2 L/min
O2 Saturation: 94.4 %
O2 Saturation: 94.4 %
PCO2 ART: 40.4 mmHg (ref 35.0–45.0)
PCO2 ART: 39.4 mmHg (ref 35.0–45.0)
PH ART: 7.314 — AB (ref 7.350–7.450)
PO2 ART: 80.6 mmHg (ref 80.0–100.0)
Patient temperature: 37
Patient temperature: 98.6
TCO2: 19.2 mmol/L (ref 0–100)
TCO2: 18.2 mmol/L (ref 0–100)
pH, Arterial: 7.314 — ABNORMAL LOW (ref 7.350–7.450)
pO2, Arterial: 80.8 mmHg (ref 80.0–100.0)

## 2013-11-02 LAB — COMPREHENSIVE METABOLIC PANEL
ALK PHOS: 51 U/L (ref 39–117)
ALT: 12 U/L (ref 0–53)
AST: 19 U/L (ref 0–37)
Albumin: 1.4 g/dL — ABNORMAL LOW (ref 3.5–5.2)
Anion gap: 10 (ref 5–15)
BUN: 51 mg/dL — ABNORMAL HIGH (ref 6–23)
CO2: 21 meq/L (ref 19–32)
Calcium: 7.5 mg/dL — ABNORMAL LOW (ref 8.4–10.5)
Chloride: 113 mEq/L — ABNORMAL HIGH (ref 96–112)
Creatinine, Ser: 2.61 mg/dL — ABNORMAL HIGH (ref 0.50–1.35)
GFR calc non Af Amer: 22 mL/min — ABNORMAL LOW (ref >90)
GFR, EST AFRICAN AMERICAN: 26 mL/min — AB (ref >90)
GLUCOSE: 165 mg/dL — AB (ref 70–99)
POTASSIUM: 4.7 meq/L (ref 3.7–5.3)
Sodium: 144 mEq/L (ref 137–147)
Total Bilirubin: <0.2 mg/dL — ABNORMAL LOW (ref 0.3–1.2)
Total Protein: 4.6 g/dL — ABNORMAL LOW (ref 6.0–8.3)

## 2013-11-02 LAB — CBC
HCT: 24.8 % — ABNORMAL LOW (ref 39.0–52.0)
Hemoglobin: 8.1 g/dL — ABNORMAL LOW (ref 13.0–17.0)
MCH: 29.9 pg (ref 26.0–34.0)
MCHC: 32.7 g/dL (ref 30.0–36.0)
MCV: 91.5 fL (ref 78.0–100.0)
PLATELETS: 116 10*3/uL — AB (ref 150–400)
RBC: 2.71 MIL/uL — AB (ref 4.22–5.81)
RDW: 17.8 % — ABNORMAL HIGH (ref 11.5–15.5)
WBC: 13.3 10*3/uL — AB (ref 4.0–10.5)

## 2013-11-02 LAB — GLUCOSE, CAPILLARY
GLUCOSE-CAPILLARY: 112 mg/dL — AB (ref 70–99)
Glucose-Capillary: 146 mg/dL — ABNORMAL HIGH (ref 70–99)
Glucose-Capillary: 147 mg/dL — ABNORMAL HIGH (ref 70–99)
Glucose-Capillary: 150 mg/dL — ABNORMAL HIGH (ref 70–99)
Glucose-Capillary: 157 mg/dL — ABNORMAL HIGH (ref 70–99)
Glucose-Capillary: 172 mg/dL — ABNORMAL HIGH (ref 70–99)

## 2013-11-02 LAB — LACTIC ACID, PLASMA: Lactic Acid, Venous: 1 mmol/L (ref 0.5–2.2)

## 2013-11-02 LAB — MAGNESIUM: Magnesium: 3 mg/dL — ABNORMAL HIGH (ref 1.5–2.5)

## 2013-11-02 LAB — PHOSPHORUS: Phosphorus: 3.8 mg/dL (ref 2.3–4.6)

## 2013-11-02 MED ORDER — PROPOFOL 10 MG/ML IV EMUL
INTRAVENOUS | Status: AC
  Filled 2013-11-02: qty 100

## 2013-11-02 MED ORDER — FENTANYL CITRATE 0.05 MG/ML IJ SOLN
INTRAMUSCULAR | Status: AC
  Filled 2013-11-02: qty 2

## 2013-11-02 MED ORDER — NALOXONE HCL 0.4 MG/ML IJ SOLN
0.4000 mg | Freq: Once | INTRAMUSCULAR | Status: AC
  Administered 2013-11-02: 0.4 mg via INTRAMUSCULAR

## 2013-11-02 MED ORDER — NALOXONE HCL 0.4 MG/ML IJ SOLN
INTRAMUSCULAR | Status: AC
  Filled 2013-11-02: qty 1

## 2013-11-02 MED ORDER — NALOXONE HCL 0.4 MG/ML IJ SOLN
INTRAMUSCULAR | Status: AC
Start: 2013-11-02 — End: 2013-11-02
  Filled 2013-11-02: qty 1

## 2013-11-02 MED ORDER — LIDOCAINE HCL (CARDIAC) 20 MG/ML IV SOLN
INTRAVENOUS | Status: AC
  Filled 2013-11-02: qty 5

## 2013-11-02 MED ORDER — ROCURONIUM BROMIDE 50 MG/5ML IV SOLN
INTRAVENOUS | Status: AC
  Filled 2013-11-02: qty 2

## 2013-11-02 MED ORDER — SUCCINYLCHOLINE CHLORIDE 20 MG/ML IJ SOLN
INTRAMUSCULAR | Status: AC
  Filled 2013-11-02: qty 1

## 2013-11-02 MED ORDER — MIDAZOLAM HCL 2 MG/2ML IJ SOLN
INTRAMUSCULAR | Status: DC
Start: 2013-11-02 — End: 2013-11-02
  Filled 2013-11-02: qty 6

## 2013-11-02 MED ORDER — NOREPINEPHRINE BITARTRATE 1 MG/ML IV SOLN
2.0000 ug/min | INTRAVENOUS | Status: DC
  Filled 2013-11-02: qty 4

## 2013-11-02 MED ORDER — ETOMIDATE 2 MG/ML IV SOLN
INTRAVENOUS | Status: AC
  Filled 2013-11-02: qty 20

## 2013-11-02 MED ORDER — NALOXONE HCL 0.4 MG/ML IJ SOLN
0.4000 mg | Freq: Once | INTRAMUSCULAR | Status: AC
  Administered 2013-11-02: 0.4 mg via INTRAVENOUS

## 2013-11-02 NOTE — Progress Notes (Signed)
Notified Dr.Thomas that pt's intra-abdominal pressure was 24. No new orders. Will continue to monitor pt.

## 2013-11-02 NOTE — Progress Notes (Signed)
eLink Physician-Brief Progress Note Patient Name: Richard [name] DOB: 08/16/1934 MRN: [id number]  Date of Service  11/02/2013   HPI/Events of Note    Restraint renewal request received. Discussed with RN. Patient still felt to be at high risk for interfering with medical care.    eICU Interventions   Renewal provided.   Intervention Category Minor Interventions: OtherLowry Ram, Sophya Vanblarcom R. 11/02/2013, 7:42 PM

## 2013-11-02 NOTE — Progress Notes (Signed)
Resumed care @ 3:30 am.Pt became hard to arouse around 5 am rounds. MD made aware, unresponsive to sternal rubs and narcan iv. Stat labs draw and the family was made aware. Orders given to intubate the Pt at bedside. As we began to intubate the Pt  became alert and stated that he would like for [place] to just " leave me alone". Plans to stop intubation for now and continue to monitor at this time. Family is present will report off to [facility] on coming am shift nurse. Jeanie Sewer, RN 7:51 AM 11/02/2013

## 2013-11-02 NOTE — Progress Notes (Signed)
5 Days Post-Op  Subjective: Confused but arousable, very similar to yesterday. No complaints  Objective: Vital signs in last 24 hours: Temp:  [97.4 F (36.3 C)-98.8 F (37.1 C)] 98.2 F (36.8 C) (07/19 0000) Pulse Rate:  [92-117] 92 (07/19 0100) Resp:  [19-37] 25 (07/19 0100) SpO2:  [94 %-100 %] 100 % (07/19 0130) Arterial Line BP: (107-155)/(50-66) 140/61 mmHg (07/19 0100) Weight:  [181 lb 14.1 oz (82.5 kg)] 181 lb 14.1 oz (82.5 kg) (07/19 0500) Last BM Date:  (gas in bag)  Intake/Output from previous day: 07/18 0701 - 07/19 0700 In: 2882.5 [I.V.:2712.5; NG/GT:120; IV Piggyback:50] Out: 795 [Urine:425; Emesis/NG output:280; Drains:70; Stool:20] Intake/Output this shift:    Resp: rales bilaterally and rhonchi bilaterally Cardio: regular rate and rhythm GI: tender and distended. quiet. ostomy pink with small amount of air in bag. drain output serous and cloundy  Lab Results:   Recent Labs  11/01/13 0500 11/02/13 0515  WBC 10.1 13.3*  HGB 8.9* 8.1*  HCT 27.6* 24.8*  PLT 138* 116*   BMET  Recent Labs  10/31/13 1836 11/01/13 0500 11/02/13 0510  NA 139  --  144  K 5.4* 5.2 4.7  CL 105  --  113*  CO2 22  --  21  GLUCOSE 163*  --  165*  BUN 24*  --  51*  CREATININE 2.08* 2.63* 2.61*  CALCIUM 8.2*  --  7.5*   PT/INR No results found for this basename: LABPROT, INR,  in the last 72 hours ABG  Recent Labs  11/01/13 0410 11/02/13 0447  PHART 7.416 7.314*  HCO3 18.9* 19.9*    Studies/Results: Dg Chest Port 1 View  11/02/2013   CLINICAL DATA:  Followup postop atelectasis. Postop from low anterior resection for rectal adenocarcinoma.  EXAM: PORTABLE CHEST - 1 VIEW  COMPARISON:  10/31/2013  FINDINGS: Nasogastric tube and left arm PICC line remain in appropriate position.  Decreased lung volumes noted. Worsening atelectasis seen in both lung bases, left side greater than right. No evidence of pneumothorax or pleural effusion. Heart size is stable.  IMPRESSION:  Worsening bibasilar atelectasis, left side greater than right.   Electronically Signed   By: Earle Gell M.D.   On: 11/02/2013 07:24   Dg Chest Port 1 View  10/31/2013   CLINICAL DATA:  Abdominal pain.  EXAM: PORTABLE CHEST - 1 VIEW  COMPARISON:  June 07, 2011.  FINDINGS: The heart size and mediastinal contours are within normal limits. Left lung is clear. Minimal right lower lobe subsegmental atelectasis or scarring is noted. Nasogastric tube tip is seen in proximal stomach. Interval placement of left-sided PICC line is noted with tip in expected position of cavoatrial junction. No pneumoperitoneum is noted. Old right rib fracture is noted.  IMPRESSION: No pneumoperitoneum is noted. Nasogastric tube tip is seen in proximal stomach. Minimal right lower lobe subsegmental atelectasis or scarring.   Electronically Signed   By: Sabino Dick M.D.   On: 10/31/2013 19:36   Dg Abd Portable 2v  10/31/2013   CLINICAL DATA:  Abdominal pain, distension, laparoscopic partial colectomy on 11/09/2013  EXAM: PORTABLE ABDOMEN - 2 VIEW  COMPARISON:  CT abdomen 06/20/2013  FINDINGS: On the left lateral decubitus view there is nondependent air concerning for pneumoperitoneum. There is gaseous dilatation of the small bowel with a few small bowel air-fluid levels. There is a nasogastric tube with the tip projecting over the stomach.  IMPRESSION: Pneumoperitoneum which may be postsurgical versus related to an anastomotic leak. Gaseous distention  of small bowel with a few small bowel air-fluid levels likely reflecting an ileus. Moderate amount of stool throughout the colon. These results were called by telephone at the time of interpretation on 10/31/2013 at 7:37 pm to [facility] ,RN, who verbally acknowledged these results.  There is a nasogastric tube with the tip projecting over the stomach.   Electronically Signed   By: Kathreen Devoid   On: 10/31/2013 19:37    Anti-infectives: Anti-infectives   Start     Dose/Rate Route  Frequency Ordered Stop   10/31/13 2300  ertapenem (INVANZ) 1 g in sodium chloride 0.9 % 50 mL IVPB     1 g 100 mL/hr over 30 Minutes Intravenous Daily at bedtime 10/31/13 2159     10/31/13 2200  ertapenem [facility]) injection 1 g  Status:  Discontinued     1 g Intramuscular Every 24 hours 10/31/13 2155 10/31/13 2158   10/27/2013 2200  cefoTEtan (CEFOTAN) 2 g in dextrose 5 % 50 mL IVPB     2 g 100 mL/hr over 30 Minutes Intravenous Every 12 hours 10/27/2013 1639 10/24/2013 2203   11/06/2013 0545  clindamycin (CLEOCIN) 900 mg, gentamicin (GARAMYCIN) 240 mg in sodium chloride 0.9 % 1,000 mL for intraperitoneal lavage  Status:  Discontinued    Comments:  Pharmacy may adjust dosing strength, schedule, rate of infusion, etc as needed to optimize therapy    Intraperitoneal To Surgery 10/30/2013 0536 11/12/2013 1625   11/06/2013 0536  cefoTEtan (CEFOTAN) 2 g in dextrose 5 % 50 mL IVPB     2 g 100 mL/hr over 30 Minutes Intravenous On call to O.R. 11/06/2013 0536 11/06/2013 1344      Assessment/Plan: s/p Procedure(s): MINIMALLY  INVASIVE ROBOT /LAPAROSCOPIC  REMOVAL OF PARTIAL COLON AND RECTUM LOW ANTERIOR RESECTION DIVERTING LOOP ILEOSTOMY RIGID PROTOSCOPY  (N/A) Continue Invanz Continue resuscitation per CCM AKI. Cr stable from yesterday. Monitor UOP Overall he seems a little better than yesterday but still critically ill Continue to monitor closely. Will check bladder pressure today  LOS: 5 days    TOTH III,PAUL S 11/02/2013

## 2013-11-02 NOTE — Progress Notes (Signed)
PULMONARY  / [place]   Name: Richard [name] MRN: [id number] DOB: [name] 29, 1935    ADMISSION DATE:  10/18/2013 CONSULTATION DATE: 10/31/13  REQUESTING CLINICIAN: Marcello Moores  CHIEF COMPLAINT/ REASON FOR CONSULT:  Hypotension, Tachypnea.    BRIEF PATIENT DESCRIPTION:  79 yo male smoker with hx of rectal cancer s/p admitted for removal of rectal mass on 7/14.  Developed hypotension, tachypnea7/17 and PCCM consulted to assist with management.  He has hx of ETOH abuse.  SIGNIFICANT EVENTS: 7/14 Robotic low anterior resection with diverting loop ileostomy, bladder repair 7/17 Transfer to ICU with hypotension, tachypnea 7/19 Bladder pressure 24  STUDIES: 7/17 Abd xray >> pneumoperitoneum  LINES / TUBES: L PICC 7/15 >>  Rt radial aline 7/18 >>   CULTURES: Blood 7/17 >>   ANTIBIOTICS: Ertapenem 7/17 >>    SUBJECTIVE: Events of last night noted.  VITAL SIGNS: Temp:  [97.4 F (36.3 C)-98.8 F (37.1 C)] 98.2 F (36.8 C) (07/19 0000) Pulse Rate:  [92-117] 92 (07/19 0100) Resp:  [19-37] 25 (07/19 0100) SpO2:  [94 %-100 %] 100 % (07/19 0130) Arterial Line BP: (107-155)/(50-66) 140/61 mmHg (07/19 0100) Weight:  [181 lb 14.1 oz (82.5 kg)] 181 lb 14.1 oz (82.5 kg) (07/19 0500) INTAKE / OUTPUT: Intake/Output     07/18 0701 - 07/19 0700 07/19 0701 - 07/20 0700   I.V. (mL/kg) 2712.5 (32.9)    NG/GT 120    IV Piggyback 50    Total Intake(mL/kg) 2882.5 (34.9)    Urine (mL/kg/hr) 425 (0.2)    Emesis/NG output 280 (0.1)    Drains 70 (0)    Stool 20 (0)    Total Output 795     Net +2087.5            PHYSICAL EXAMINATION: General: ill appearing, increased WOB Neuro: lethargic, follows simple commands HEENT: temporal wasting, NG tube in place Cardiovascular: regular, no murmur Lungs: scattered rhonchi, no wheeze Abdomen:  Distended, mild tenderness Musculoskeletal: no edema Skin: no rashes  CBC Recent Labs     10/31/13  1836  11/01/13  0500   11/02/13  0515  WBC  4.6  10.1  13.3*  HGB  10.0*  8.9*  8.1*  HCT  31.9*  27.6*  24.8*  PLT  173  138*  116*   BMET Recent Labs     10/31/13  1836  11/01/13  0500  11/02/13  0510  NA  139   --   144  K  5.4*  5.2  4.7  CL  105   --   113*  CO2  22   --   21  BUN  24*   --   51*  CREATININE  2.08*  2.63*  2.61*  GLUCOSE  163*   --   165*    Electrolytes Recent Labs     10/31/13  1836  11/02/13  0510  CALCIUM  8.2*  7.5*  MG   --   3.0*  PHOS   --   3.8   ABG Recent Labs     11/01/13  0410  11/02/13  0447  PHART  7.416  7.314*  PCO2ART  30.0*  40.4  PO2ART  75.8*  80.6    CBG (last 3)   Recent Labs  11/02/13 0045 11/02/13 0452 11/02/13 0800  GLUCAP 112* 146* 157*    Imaging Dg Chest Port 1 View  11/02/2013   CLINICAL DATA:  Followup postop atelectasis. Postop from low anterior resection  for rectal adenocarcinoma.  EXAM: PORTABLE CHEST - 1 VIEW  COMPARISON:  10/31/2013  FINDINGS: Nasogastric tube and left arm PICC line remain in appropriate position.  Decreased lung volumes noted. Worsening atelectasis seen in both lung bases, left side greater than right. No evidence of pneumothorax or pleural effusion. Heart size is stable.  IMPRESSION: Worsening bibasilar atelectasis, left side greater than right.   Electronically Signed   By: Earle Gell M.D.   On: 11/02/2013 07:24   Dg Chest Port 1 View  10/31/2013   CLINICAL DATA:  Abdominal pain.  EXAM: PORTABLE CHEST - 1 VIEW  COMPARISON:  June 07, 2011.  FINDINGS: The heart size and mediastinal contours are within normal limits. Left lung is clear. Minimal right lower lobe subsegmental atelectasis or scarring is noted. Nasogastric tube tip is seen in proximal stomach. Interval placement of left-sided PICC line is noted with tip in expected position of cavoatrial junction. No pneumoperitoneum is noted. Old right rib fracture is noted.  IMPRESSION: No pneumoperitoneum is noted. Nasogastric tube tip is seen in proximal  stomach. Minimal right lower lobe subsegmental atelectasis or scarring.   Electronically Signed   By: Sabino Dick M.D.   On: 10/31/2013 19:36   Dg Abd Portable 2v  10/31/2013   CLINICAL DATA:  Abdominal pain, distension, laparoscopic partial colectomy on 10/22/2013  EXAM: PORTABLE ABDOMEN - 2 VIEW  COMPARISON:  CT abdomen 06/20/2013  FINDINGS: On the left lateral decubitus view there is nondependent air concerning for pneumoperitoneum. There is gaseous dilatation of the small bowel with a few small bowel air-fluid levels. There is a nasogastric tube with the tip projecting over the stomach.  IMPRESSION: Pneumoperitoneum which may be postsurgical versus related to an anastomotic leak. Gaseous distention of small bowel with a few small bowel air-fluid levels likely reflecting an ileus. Moderate amount of stool throughout the colon. These results were called by telephone at the time of interpretation on 10/31/2013 at 7:37 pm to [facility] ,RN, who verbally acknowledged these results.  There is a nasogastric tube with the tip projecting over the stomach.   Electronically Signed   By: Kathreen Devoid   On: 10/31/2013 19:37    ASSESSMENT / PLAN:  PULMONARY:  A: Acute hypoxic respiratory failure 2nd to presumed AECOPD and sepsis. Tobacco abuse. P:   F/u CXR, ABG Oxygen to keep SpO2 90 to 94% Continue pulmicort, scheduled BD's Continue solumedrol 20 mg q12h >> if respiratory status stable, then start to wean off on 7/20 Bronchial hygiene  Doubt he would tolerate BiPAP >> if respiratory status worse, then likely would need intubation  CARDIOVASCULAR A: Severe sepsis 2nd to peritonitis. P:   Continue IV fluids Pressors as needed to keep SBP > 90, MAP > 65  RENAL A:  Acute kidney injury 2nd to sepsis (baseline creatinine 0.88 from 10/31/13). Oliguria. P:   Optimize hemodynamics Monitor renal fx, urine outpt, electrolytes Defer renal evaluation for now  GASTROINTESTINAL A: Rectal cancer s/p   low anterior resection with diverting loop ileostomy with post-op course likely complicated by anastomotic leak >> increased bladder pressures noted 7/19. Protein calorie malnutrition. P:   Management per surgery NPO Protonix for SUP  HEMATOLOGIC A: Anemia of critical illness and chronic disease. P:   F/u CBC Transfuse for Hb < 7 SQ heparin for DVT prevention  INFECTIOUS A: Severe sepsis likely due to peritonitis. P:   Day 3 eratapenem  ENDOCRINE A: Hyperglycemia. P:   SSI  NEUROLOGIC A: Acute encephalopathy  2nd to sepsis, hypoxia, renal failure. Hx of ETOH. P:   Thiamine, folic acid, dextrose in IV fluid Prn fentanyl for post-op pain control If delirium gets worse, then precedex may be good option with hx of ETOH abuse  CC time 35 minutes  Chesley Mires, MD [place] 11/02/2013, 7:58 AM Pager:  [phone] After 3pm call: [phone]

## 2013-11-03 ENCOUNTER — Inpatient Hospital Stay (HOSPITAL_COMMUNITY): Payer: Medicare Other

## 2013-11-03 LAB — COMPREHENSIVE METABOLIC PANEL
ALBUMIN: 1.2 g/dL — AB (ref 3.5–5.2)
ALK PHOS: 129 U/L — AB (ref 39–117)
ALT: 15 U/L (ref 0–53)
ANION GAP: 9 (ref 5–15)
AST: 16 U/L (ref 0–37)
BUN: 49 mg/dL — AB (ref 6–23)
CHLORIDE: 119 meq/L — AB (ref 96–112)
CO2: 20 mEq/L (ref 19–32)
Calcium: 7.5 mg/dL — ABNORMAL LOW (ref 8.4–10.5)
Creatinine, Ser: 1.59 mg/dL — ABNORMAL HIGH (ref 0.50–1.35)
GFR calc Af Amer: 47 mL/min — ABNORMAL LOW (ref >90)
GFR calc non Af Amer: 41 mL/min — ABNORMAL LOW (ref >90)
Glucose, Bld: 182 mg/dL — ABNORMAL HIGH (ref 70–99)
POTASSIUM: 4.5 meq/L (ref 3.7–5.3)
Sodium: 148 mEq/L — ABNORMAL HIGH (ref 137–147)
Total Protein: 4.6 g/dL — ABNORMAL LOW (ref 6.0–8.3)

## 2013-11-03 LAB — CBC
HCT: 24.1 % — ABNORMAL LOW (ref 39.0–52.0)
HEMATOCRIT: 23.3 % — AB (ref 39.0–52.0)
Hemoglobin: 7.5 g/dL — ABNORMAL LOW (ref 13.0–17.0)
Hemoglobin: 7.7 g/dL — ABNORMAL LOW (ref 13.0–17.0)
MCH: 28.8 pg (ref 26.0–34.0)
MCH: 29.4 pg (ref 26.0–34.0)
MCHC: 32.2 g/dL (ref 30.0–36.0)
MCHC: 32 g/dL (ref 30.0–36.0)
MCV: 89.6 fL (ref 78.0–100.0)
MCV: 92 fL (ref 78.0–100.0)
PLATELETS: 84 10*3/uL — AB (ref 150–400)
Platelets: 91 10*3/uL — ABNORMAL LOW (ref 150–400)
RBC: 2.6 MIL/uL — ABNORMAL LOW (ref 4.22–5.81)
RBC: 2.62 MIL/uL — ABNORMAL LOW (ref 4.22–5.81)
RDW: 17.9 % — ABNORMAL HIGH (ref 11.5–15.5)
RDW: 17.7 % — AB (ref 11.5–15.5)
WBC: 14.8 10*3/uL — ABNORMAL HIGH (ref 4.0–10.5)
WBC: 16.9 10*3/uL — ABNORMAL HIGH (ref 4.0–10.5)

## 2013-11-03 LAB — BLOOD GAS, ARTERIAL
Acid-base deficit: 4 mmol/L — ABNORMAL HIGH (ref 0.0–2.0)
Acid-base deficit: 6.8 mmol/L — ABNORMAL HIGH (ref 0.0–2.0)
BICARBONATE: 18.3 meq/L — AB (ref 20.0–24.0)
Bicarbonate: 19 mEq/L — ABNORMAL LOW (ref 20.0–24.0)
DRAWN BY: 41977
Drawn by: 362771
FIO2: 0.21 %
O2 Content: 3 L/min
O2 SAT: 92.9 %
O2 Saturation: 92.4 %
PCO2 ART: 28.9 mmHg — AB (ref 35.0–45.0)
PH ART: 7.434 (ref 7.350–7.450)
PO2 ART: 63.9 mmHg — AB (ref 80.0–100.0)
Patient temperature: 98.7
Patient temperature: 98.6
TCO2: 17.7 mmol/L (ref 0–100)
TCO2: 17.4 mmol/L (ref 0–100)
pCO2 arterial: 37.1 mmHg (ref 35.0–45.0)
pH, Arterial: 7.314 — ABNORMAL LOW (ref 7.350–7.450)
pO2, Arterial: 75.1 mmHg — ABNORMAL LOW (ref 80.0–100.0)

## 2013-11-03 LAB — GLUCOSE, CAPILLARY
GLUCOSE-CAPILLARY: 128 mg/dL — AB (ref 70–99)
GLUCOSE-CAPILLARY: 145 mg/dL — AB (ref 70–99)
GLUCOSE-CAPILLARY: 148 mg/dL — AB (ref 70–99)
GLUCOSE-CAPILLARY: 122 mg/dL — AB (ref 70–99)
Glucose-Capillary: 147 mg/dL — ABNORMAL HIGH (ref 70–99)
Glucose-Capillary: 141 mg/dL — ABNORMAL HIGH (ref 70–99)

## 2013-11-03 LAB — TYPE AND SCREEN
ABO/RH(D): A NEG
ANTIBODY SCREEN: NEGATIVE

## 2013-11-03 MED ORDER — HEPARIN SODIUM (PORCINE) 5000 UNIT/ML IJ SOLN
5000.0000 [IU] | Freq: Three times a day (TID) | INTRAMUSCULAR | Status: DC
  Administered 2013-11-03: 5000 [IU] via SUBCUTANEOUS
  Filled 2013-11-03: qty 1

## 2013-11-03 MED ORDER — CHLORHEXIDINE GLUCONATE 0.12 % MT SOLN
15.0000 mL | Freq: Two times a day (BID) | OROMUCOSAL | Status: DC
  Administered 2013-11-03 – 2013-11-07 (×9): 15 mL via OROMUCOSAL
  Filled 2013-11-03 (×11): qty 15

## 2013-11-03 MED ORDER — METHYLPREDNISOLONE SODIUM SUCC 40 MG IJ SOLR
20.0000 mg | Freq: Every day | INTRAMUSCULAR | Status: DC
  Administered 2013-11-04: 20 mg via INTRAVENOUS
  Filled 2013-11-03 (×2): qty 1

## 2013-11-03 MED ORDER — HEPARIN SODIUM (PORCINE) 5000 UNIT/ML IJ SOLN
5000.0000 [IU] | Freq: Three times a day (TID) | INTRAMUSCULAR | Status: DC
  Administered 2013-11-04 – 2013-11-07 (×11): 5000 [IU] via SUBCUTANEOUS
  Filled 2013-11-03 (×14): qty 1

## 2013-11-03 MED ORDER — DEXTROSE-NACL 5-0.45 % IV SOLN
INTRAVENOUS | Status: DC
  Administered 2013-11-03 (×2): via INTRAVENOUS

## 2013-11-03 MED ORDER — NICOTINE 14 MG/24HR TD PT24
14.0000 mg | MEDICATED_PATCH | Freq: Every day | TRANSDERMAL | Status: DC
  Administered 2013-11-03 – 2013-11-04 (×2): 14 mg via TRANSDERMAL
  Filled 2013-11-03 (×3): qty 1

## 2013-11-03 MED ORDER — BIOTENE DRY MOUTH MT LIQD
15.0000 mL | Freq: Two times a day (BID) | OROMUCOSAL | Status: DC
  Administered 2013-11-03 – 2013-11-07 (×8): 15 mL via OROMUCOSAL

## 2013-11-03 NOTE — Progress Notes (Signed)
Chart reviewed next review due on [id number]/Sharrell Krawiec,RN,BSN,CCM

## 2013-11-03 NOTE — Consult Note (Signed)
WOC ostomy follow up Stoma type/location: RMQ loop ileostomy Stomal assessment/size: 1 and 1/2 inches round. Peristomal assessment: intact, clear Treatment options for stomal/peristomal skin: none indicated.  As stoma is still shrinking, I will keep the skin barrier ring. Output 155ml of brown effluent in pouch at time of change. Ostomy pouching: 2pc., 2 and 1/4 inch pouching system with skin barrier ring. Education provided: None today.  No family in room at time of visit. Supplies in room with teaching booklet and 1 page pouch changing guide. Enrolled patient in [place] Start Discharge program: Yes [place] nursing team will follow, and will remain available to this patient, the nursing, surgical and medical teams.  We will teach as appropriate to either patient or caregivers as condition improves. Thanks, Maudie Flakes, MSN, RN, [place], [place], [place] [phone])

## 2013-11-03 NOTE — Progress Notes (Signed)
eLink Physician-Brief Progress Note Patient Name: Richard [name] DOB: November 03, 1934 MRN: [id number]  Date of Service  11/03/2013   HPI/Events of Note   Pt asking for nicotine patch  eICU Interventions  Nicotine patch ordered    Intervention Category Intermediate Interventions: Other:  Asencion Noble 11/03/2013, 5:11 PM

## 2013-11-03 NOTE — Progress Notes (Signed)
PULMONARY  / [place]   Name: Richard [name] MRN: [id number] DOB: January 10, 1935    ADMISSION DATE:  11/08/2013 CONSULTATION DATE: 10/31/13  REQUESTING CLINICIAN: Marcello Moores  CHIEF COMPLAINT/ REASON FOR CONSULT:  Hypotension, Tachypnea.    BRIEF PATIENT DESCRIPTION:  79 yo male smoker with hx of rectal cancer s/p admitted for removal of rectal mass on 7/14.  Developed hypotension, tachypnea 7/17 and PCCM consulted to assist with management.  He has hx of ETOH abuse.  SIGNIFICANT EVENTS: 7/14 Robotic low anterior resection with diverting loop ileostomy, bladder repair 7/17 Transfer to ICU with hypotension, tachypnea 7/19 Bladder pressure 24  STUDIES: 7/17 Abd xray >> pneumoperitoneum  LINES / TUBES: L PICC 7/15 >>  Rt radial aline 7/18 >>  CULTURES: Blood 7/17 >>   ANTIBIOTICS: Ertapenem 7/17 >>    SUBJECTIVE: More alert this morning.  Off pressors. Doing well on 3L [place].   VITAL SIGNS: Temp:  [96.7 F (35.9 C)-98.1 F (36.7 C)] 97.7 F (36.5 C) (07/20 0700) Pulse Rate:  [75-91] 78 (07/20 0700) Resp:  [17-26] 17 (07/20 0700) SpO2:  [94 %-100 %] 100 % (07/20 0700) Arterial Line BP: (138-168)/(56-70) 154/56 mmHg (07/20 0700) Weight:  [81.3 kg (179 lb 3.7 oz)] 81.3 kg (179 lb 3.7 oz) (07/20 0403) INTAKE / OUTPUT: Intake/Output     07/19 0701 - 07/20 0700 07/20 0701 - 07/21 0700   I.V. (mL/kg) 4200 (51.7)    NG/GT     IV Piggyback 50    Total Intake(mL/kg) 4250 (52.3)    Urine (mL/kg/hr) 1235 (0.6)    Emesis/NG output 350 (0.2)    Drains 40 (0)    Stool 550 (0.3)    Total Output 2175     Net +2075           PHYSICAL EXAMINATION: General: ill appearing elderly male Neuro: alert, oriented to self, follows simple commands HEENT: PERRL, NG tube in place Cardiovascular: regular, no murmur Lungs: scattered rhonchi, upper airway congestion Abdomen:  Distended, mild tenderness, crepitus palpated over the LUQ, colostomy bag and drains in  place Musculoskeletal: 1+ pitting edema in the LLQ Skin: no rashes  CBC Recent Labs     11/01/13  0500  11/02/13  0515  11/03/13  0515  WBC  10.1  13.3*  14.8*  HGB  8.9*  8.1*  7.5*  HCT  27.6*  24.8*  23.3*  PLT  138*  116*  91*   BMET Recent Labs     10/31/13  1836  11/01/13  0500  11/02/13  0510  11/03/13  0515  NA  139   --   144  148*  K  5.4*  5.2  4.7  4.5  CL  105   --   113*  119*  CO2  22   --   21  20  BUN  24*   --   51*  49*  CREATININE  2.08*  2.63*  2.61*  1.59*  GLUCOSE  163*   --   165*  182*    Electrolytes Recent Labs     10/31/13  1836  11/02/13  0510  11/03/13  0515  CALCIUM  8.2*  7.5*  7.5*  MG   --   3.0*   --   PHOS   --   3.8   --    ABG Recent Labs     11/02/13  0447  11/02/13  0850  11/03/13  0310  PHART  7.314*  7.314*  7.314*  PCO2ART  40.4  39.4  37.1  PO2ART  80.6  80.8  75.1*    CBG (last 3)   Recent Labs  11/02/13 2339 11/03/13 0357 11/03/13 0806  GLUCAP 150* 145* 148*    Imaging Dg Chest Port 1 View  11/03/2013   CLINICAL DATA:  Hypotension.  Tachypnea.  History of rectal cancer.  EXAM: PORTABLE CHEST - 1 VIEW  COMPARISON:  Single view of the chest 11/02/2013 and 10/31/2013.  FINDINGS: Support apparatus is unchanged. There is bibasilar airspace disease, worse on the left. No pneumothorax identified. There appears to be a small left pleural effusion. Heart size is upper normal.  IMPRESSION: Small left pleural effusion and bibasilar airspace disease, worse on the left, likely due to atelectasis.   Electronically Signed   By: Inge Rise M.D.   On: 11/03/2013 07:23   Dg Chest Port 1 View  11/02/2013   CLINICAL DATA:  Followup postop atelectasis. Postop from low anterior resection for rectal adenocarcinoma.  EXAM: PORTABLE CHEST - 1 VIEW  COMPARISON:  10/31/2013  FINDINGS: Nasogastric tube and left arm PICC line remain in appropriate position.  Decreased lung volumes noted. Worsening atelectasis seen in both lung  bases, left side greater than right. No evidence of pneumothorax or pleural effusion. Heart size is stable.  IMPRESSION: Worsening bibasilar atelectasis, left side greater than right.   Electronically Signed   By: Earle Gell M.D.   On: 11/02/2013 07:24    ASSESSMENT / PLAN:  PULMONARY:  A: Acute hypoxic respiratory failure 2nd to presumed AECOPD and sepsis. Tobacco abuse. Doing well on 3L [place], rhonchi throughout P:   F/u CXR this afternoon Oxygen to keep SpO2 90 to 94% Continue pulmicort, scheduled BD's Taper solumedrol to 20 mg daily on 7/20 Bronchial hygiene   CARDIOVASCULAR A: Severe sepsis 2nd to peritonitis. Off pressors  P:   Continue IV fluids Goal SBP > 90, MAP > 65  RENAL A:  Acute kidney injury 2nd to sepsis (baseline creatinine 0.88 from 10/31/13) - improving 7/20 Oliguria. Hypernatremia Metabolic acidosis with slightly elevated AG 7/20 P:   Optimize hemodynamics Monitor renal fx, urine outpt, electrolytes Change IVFs to D5-1/2 NS - begin to decrease rate 7/21 Repeat ABG in AM to monitor improving metabolic acidosis and before possible removal of A-line  GASTROINTESTINAL A: Rectal cancer s/p low anterior resection with diverting loop ileostomy with post-op course likely complicated by anastomotic leak >> increased bladder pressures noted 7/19. Protein calorie malnutrition. Crepitus noted in LUQ 7/20  P:   Management per surgery NPO Protonix for SUP F/u abdominal xr 7/20 Speech therapy to assess swallowing  HEMATOLOGIC A: Anemia of critical illness and chronic disease. Thrombocytopenia P:   F/u CBC this afternoon and in AM Transfuse for Hb < 7 SQ heparin for DVT prevention SCDs  INFECTIOUS A: Severe sepsis likely due to peritonitis. Leukocytosis P:   Day 4 eratapenem Would consider adding second agent if febrile or clinically worsens  ENDOCRINE A: Hyperglycemia  P:   SSI  NEUROLOGIC A: Acute encephalopathy 2nd to sepsis, hypoxia,  renal failure - improving 7/20 Hx of ETOH. P:   Thiamine, folic acid, dextrose in IV fluid Prn fentanyl for post-op pain control If delirium gets worse, then precedex may be good option with hx of ETOH abuse  CC time 35 minutes  [employer] ACNP student  Reviewed above, examined, and documentation changes made as needed.  Mental status slowly improving.  Renal function  better >> need to monitor urine outpt and keep up with his volume.  Respiratory status stable >> start to wean off solumedrol, and continue bronchial hygiene.  Continue Abx for abdominal process.    D/w Dr. Rosendo Gros.  CC time 35 minutes.  Chesley Mires, MD [facility] Pulmonary/Critical Care 11/03/2013, 11:40 AM Pager:  [phone] After 3pm call: [phone]

## 2013-11-03 NOTE — Progress Notes (Signed)
6 Days Post-Op  Subjective: Pt with no acute events.  arousable this AM  Objective: Vital signs in last 24 hours: Temp:  [96.7 F (35.9 C)-98.1 F (36.7 C)] 97.3 F (36.3 C) (07/20 0900) Pulse Rate:  [75-91] 82 (07/20 0900) Resp:  [17-25] 20 (07/20 0900) BP: (131-146)/(49-56) 146/49 mmHg (07/20 0900) SpO2:  [96 %-100 %] 100 % (07/20 0900) Arterial Line BP: (138-169)/(56-70) 169/62 mmHg (07/20 0900) Weight:  [179 lb 3.7 oz (81.3 kg)] 179 lb 3.7 oz (81.3 kg) (07/20 0403) Last BM Date:  (gas in bag)  Intake/Output from previous day: 07/19 0701 - 07/20 0700 In: 4250 [I.V.:4200; IV Piggyback:50] Out: 2175 [Urine:1235; Emesis/NG output:350; Drains:40; Stool:550] Intake/Output this shift: Total I/O In: 450 [I.V.:450] Out: 310 [Urine:310]  General appearance: alert Resp: coarse b/l BS Cardio: regular rate and rhythm, S1, S2 normal, no murmur, click, rub or gallop GI: soft, active BS, ostomy patent, JP serous with feculent material, wounds c/d/i  Lab Results:   Recent Labs  11/02/13 0515 11/03/13 0515  WBC 13.3* 14.8*  HGB 8.1* 7.5*  HCT 24.8* 23.3*  PLT 116* 91*   BMET  Recent Labs  11/02/13 0510 11/03/13 0515  NA 144 148*  K 4.7 4.5  CL 113* 119*  CO2 21 20  GLUCOSE 165* 182*  BUN 51* 49*  CREATININE 2.61* 1.59*  CALCIUM 7.5* 7.5*   PT/INR No results found for this basename: LABPROT, INR,  in the last 72 hours ABG  Recent Labs  11/02/13 0850 11/03/13 0310  PHART 7.314* 7.314*  HCO3 19.4* 18.3*    Studies/Results: Dg Chest Port 1 View  11/03/2013   CLINICAL DATA:  Hypotension.  Tachypnea.  History of rectal cancer.  EXAM: PORTABLE CHEST - 1 VIEW  COMPARISON:  Single view of the chest 11/02/2013 and 10/31/2013.  FINDINGS: Support apparatus is unchanged. There is bibasilar airspace disease, worse on the left. No pneumothorax identified. There appears to be a small left pleural effusion. Heart size is upper normal.  IMPRESSION: Small left pleural effusion  and bibasilar airspace disease, worse on the left, likely due to atelectasis.   Electronically Signed   By: Inge Rise M.D.   On: 11/03/2013 07:23   Dg Chest Port 1 View  11/02/2013   CLINICAL DATA:  Followup postop atelectasis. Postop from low anterior resection for rectal adenocarcinoma.  EXAM: PORTABLE CHEST - 1 VIEW  COMPARISON:  10/31/2013  FINDINGS: Nasogastric tube and left arm PICC line remain in appropriate position.  Decreased lung volumes noted. Worsening atelectasis seen in both lung bases, left side greater than right. No evidence of pneumothorax or pleural effusion. Heart size is stable.  IMPRESSION: Worsening bibasilar atelectasis, left side greater than right.   Electronically Signed   By: Earle Gell M.D.   On: 11/02/2013 07:24    Anti-infectives: Anti-infectives   Start     Dose/Rate Route Frequency Ordered Stop   10/31/13 2300  ertapenem (INVANZ) 1 g in sodium chloride 0.9 % 50 mL IVPB     1 g 100 mL/hr over 30 Minutes Intravenous Daily at bedtime 10/31/13 2159     10/31/13 2200  ertapenem [facility]) injection 1 g  Status:  Discontinued     1 g Intramuscular Every 24 hours 10/31/13 2155 10/31/13 2158   11/10/2013 2200  cefoTEtan (CEFOTAN) 2 g in dextrose 5 % 50 mL IVPB     2 g 100 mL/hr over 30 Minutes Intravenous Every 12 hours 10/29/2013 1639 11/06/2013 2203   10/27/2013  0545  clindamycin (CLEOCIN) 900 mg, gentamicin (GARAMYCIN) 240 mg in sodium chloride 0.9 % 1,000 mL for intraperitoneal lavage  Status:  Discontinued    Comments:  Pharmacy may adjust dosing strength, schedule, rate of infusion, etc as needed to optimize therapy    Intraperitoneal To Surgery 10/19/2013 0536 11/06/2013 1625   10/24/2013 0536  cefoTEtan (CEFOTAN) 2 g in dextrose 5 % 50 mL IVPB     2 g 100 mL/hr over 30 Minutes Intravenous On call to O.R. 10/19/2013 0536 11/09/2013 1344      Assessment/Plan: s/p Procedure(s): MINIMALLY  INVASIVE ROBOT /LAPAROSCOPIC  REMOVAL OF PARTIAL COLON AND RECTUM LOW ANTERIOR  RESECTION DIVERTING LOOP ILEOSTOMY RIGID PROTOSCOPY  (N/A) Pt's AKI seems to be improving, UOP up Coarse b/l BS - will order Chest PT, flutter valve Speech eval assess swallow-if Ok can DC NGT as pt with good bowel function currently Mobilize/PT con't JP drain Abx  LOS: 6 days    Rosario Jacks., Anne Hahn 11/03/2013

## 2013-11-04 LAB — COMPREHENSIVE METABOLIC PANEL
ALT: 15 U/L (ref 0–53)
ANION GAP: 8 (ref 5–15)
AST: 15 U/L (ref 0–37)
Albumin: 1.2 g/dL — ABNORMAL LOW (ref 3.5–5.2)
Alkaline Phosphatase: 67 U/L (ref 39–117)
BUN: 41 mg/dL — AB (ref 6–23)
CHLORIDE: 119 meq/L — AB (ref 96–112)
CO2: 20 meq/L (ref 19–32)
CREATININE: 1.21 mg/dL (ref 0.50–1.35)
Calcium: 7.6 mg/dL — ABNORMAL LOW (ref 8.4–10.5)
GFR calc Af Amer: 65 mL/min — ABNORMAL LOW (ref >90)
GFR, EST NON AFRICAN AMERICAN: 56 mL/min — AB (ref >90)
Glucose, Bld: 161 mg/dL — ABNORMAL HIGH (ref 70–99)
POTASSIUM: 4.1 meq/L (ref 3.7–5.3)
Sodium: 147 mEq/L (ref 137–147)
Total Bilirubin: <0.2 mg/dL — ABNORMAL LOW (ref 0.3–1.2)
Total Protein: 4.6 g/dL — ABNORMAL LOW (ref 6.0–8.3)

## 2013-11-04 LAB — BLOOD GAS, ARTERIAL
Acid-base deficit: 4.6 mmol/L — ABNORMAL HIGH (ref 0.0–2.0)
BICARBONATE: 20.1 meq/L (ref 20.0–24.0)
Drawn by: 362771
O2 Content: 2 L/min
O2 SAT: 95.5 %
Patient temperature: 98.6
TCO2: 19.5 mmol/L (ref 0–100)
pCO2 arterial: 38 mmHg (ref 35.0–45.0)
pH, Arterial: 7.343 — ABNORMAL LOW (ref 7.350–7.450)
pO2, Arterial: 81.2 mmHg (ref 80.0–100.0)

## 2013-11-04 LAB — CBC
HCT: 22.8 % — ABNORMAL LOW (ref 39.0–52.0)
HEMOGLOBIN: 7.3 g/dL — AB (ref 13.0–17.0)
MCH: 29.1 pg (ref 26.0–34.0)
MCHC: 32 g/dL (ref 30.0–36.0)
MCV: 90.8 fL (ref 78.0–100.0)
Platelets: 76 10*3/uL — ABNORMAL LOW (ref 150–400)
RBC: 2.51 MIL/uL — ABNORMAL LOW (ref 4.22–5.81)
RDW: 17.9 % — AB (ref 11.5–15.5)
WBC: 16.7 10*3/uL — AB (ref 4.0–10.5)

## 2013-11-04 LAB — GLUCOSE, CAPILLARY
GLUCOSE-CAPILLARY: 113 mg/dL — AB (ref 70–99)
GLUCOSE-CAPILLARY: 121 mg/dL — AB (ref 70–99)
Glucose-Capillary: 117 mg/dL — ABNORMAL HIGH (ref 70–99)
Glucose-Capillary: 118 mg/dL — ABNORMAL HIGH (ref 70–99)
Glucose-Capillary: 133 mg/dL — ABNORMAL HIGH (ref 70–99)
Glucose-Capillary: 142 mg/dL — ABNORMAL HIGH (ref 70–99)

## 2013-11-04 LAB — PHOSPHORUS: PHOSPHORUS: 2.8 mg/dL (ref 2.3–4.6)

## 2013-11-04 LAB — MAGNESIUM: Magnesium: 2.8 mg/dL — ABNORMAL HIGH (ref 1.5–2.5)

## 2013-11-04 MED ORDER — DEXTROSE-NACL 5-0.45 % IV SOLN
INTRAVENOUS | Status: DC
  Administered 2013-11-04 – 2013-11-06 (×3): via INTRAVENOUS

## 2013-11-04 MED ORDER — IPRATROPIUM-ALBUTEROL 0.5-2.5 (3) MG/3ML IN SOLN
3.0000 mL | Freq: Four times a day (QID) | RESPIRATORY_TRACT | Status: DC
  Administered 2013-11-04 – 2013-11-05 (×3): 3 mL via RESPIRATORY_TRACT
  Filled 2013-11-04 (×4): qty 3

## 2013-11-04 NOTE — Progress Notes (Signed)
Surgery paged regarding patient's removal of NG tube. Per Dr. Excell Seltzer, okay for NG to remain out d/t patient's agitation/confusion. SLP is ordered to see patient. May reinsert at a later time if needed per SLP's recommendations.

## 2013-11-04 NOTE — Progress Notes (Signed)
NUTRITION FOLLOW UP  Intervention:   - Diet advancement per SLP/MD - RD to monitor plan of care   Nutrition Dx:   Inadequate oral intake related to post op as evidenced by stated intake - ongoing but now related to inability to eat as evidenced by NPO   Goal:   Tolerate diet with intake to meet >75% estimated needs - not met NPO  New goal: Advance diet as tolerated to diet per SLP/MD   Monitor:   Weights, labs, diet advancement   Assessment:   Patient s/p ileostomy for rectal adenocarcinoma.   7/15: - Tolerating a light lunch with clear liquids, pureed soup and ice cream. To get a pureed dinner and advance to Heart Healthy if passing flatus and continued tolerance of po intake.  - Good intake prior to admit. Patient had seen the [place] [facility] 07/28/13 secondary to weight loss and weight has increased since that time. UBW 159 lbs 07/16/13.   7/21: - Rapid response called 7/17 for hypotension, tachycardia, tachypnea. AXR concerning for possible anastomotic leak. Abd exam reveal distention with tenderness in lower abdomen. JP with feculent output. Concerning for anastomotic leak with sepsis. - Pt pulled out NGT 7/17 was replaced, pt pulled out again yesterday - Had SLP bedside swallow evaluation today, was noted to be at mild aspiration risk with recommendations for thin liquids initially then advance to dysphagia 3/thin when dentures arrive - Has been NPO since 7/18 - Per conversation with RN, pt had restraints removed this morning, placed 7/17 for agitation  - Pt currently sitting in chair, falling asleep, did not disturb     Height: Ht Readings from Last 1 Encounters:  11/02/13 5' 11.5" (1.816 m)    Weight Status:   Wt Readings from Last 1 Encounters:  11/04/13 181 lb 7 oz (82.3 kg)  Admit wt         158 lb (71.6 kg) Net I/Os: +10.8L  Re-estimated needs:  Kcal: 2100-2200  Protein: 95-105 gm  Fluid: >2L daily   Skin: Non-pitting RLE, LLE edema, +2  perineal edema  Diet Order: NPO   Intake/Output Summary (Last 24 hours) at 11/04/13 1135 Last data filed at 11/04/13 1000  Gross per 24 hour  Intake 2813.33 ml  Output   1915 ml  Net 898.33 ml    Last BM: 7/21 from ileostomy   Labs:   Recent Labs Lab 10/29/13 0515  10/31/13 1836  11/02/13 0510 11/03/13 0515 11/04/13 0410  NA 139  --  139  --  144 148* 147  K 4.3  < > 5.4*  < > 4.7 4.5 4.1  CL 105  --  105  --  113* 119* 119*  CO2 26  --  22  --  _0 BUN 9  --  24*  --  51* 49* 41*  CREATININE 0.90  < > 2.08*  < > 2.61* 1.59* 1.21  CALCIUM 7.9*  --  8.2*  --  7.5* 7.5* 7.6*  MG 1.8  --   --   --  3.0*  --  2.8*  PHOS  --   --   --   --  3.8  --  2.8  GLUCOSE 131*  --  163*  --  165* 182* 161*  < > = values in this interval not displayed.  CBG (last 3)   Recent Labs  11/04/13 0047 11/04/13 0358 11/04/13 0812  GLUCAP 118* 142* 133*    Scheduled Meds: .  antiseptic oral rinse  15 mL Mouth Rinse q12n4p  . budesonide (PULMICORT) nebulizer solution  0.5 mg Nebulization Q12H  . chlorhexidine  15 mL Mouth Rinse BID  . ertapenem  1 g Intravenous QHS  . folic acid  1 mg Intravenous Daily  . heparin subcutaneous  5,000 Units Subcutaneous 3 times per day  . insulin aspart  0-9 Units Subcutaneous 6 times per day  . ipratropium-albuterol  3 mL Nebulization Q6H WA  . lip balm  1 application Topical BID  . methylPREDNISolone (SOLU-MEDROL) injection  20 mg Intravenous Daily  . nicotine  14 mg Transdermal Daily  . pantoprazole (PROTONIX) IV  40 mg Intravenous Q24H  . thiamine IV  100 mg Intravenous Daily    Continuous Infusions: . dextrose 5 % and 0.45% NaCl 50 mL/hr at 11/04/13 [place], RD, [place] [phone] Pager [phone] Weekend/After Hours Pager

## 2013-11-04 NOTE — Progress Notes (Signed)
Patient very agitated this morning asking for a smoke and seeing "a fire in the corner". A-line came out d/t the agitation. Dr. Jimmy Footman made aware. A-line remains out. Soft wrist restraints remain on for safety.

## 2013-11-04 NOTE — Evaluation (Signed)
Clinical/Bedside Swallow Evaluation Patient Details  Name: Richard [name] MRN: [id number] Date of Birth: Dec 01, 1934  Today's Date: 11/04/2013 Time: 0910-0924 SLP Time Calculation (min): 14 min  Past Medical History:  Past Medical History  Diagnosis Date  . Rectal adenocarcinoma (7cm posterior) 06/16/2013  . Kyphoscoliosis deformity of spine   . History of radiation therapy 07/14/13-08/20/13    rectal, 50.4Gy/  . Psoriasis     SCALP  . Hemorrhoids   . History of transfusion   . Anemia of chronic disease 11/08/2013   Past Surgical History:  Past Surgical History  Procedure Laterality Date  . I&d extremity  06/07/2011    Procedure: IRRIGATION AND DEBRIDEMENT EXTREMITY;  Surgeon: Linna Hoff, MD;  Location: [place];  Service: Orthopedics;  Laterality: Left;  . Amputation  06/07/2011    Procedure: AMPUTATION DIGIT;  Surgeon: Linna Hoff, MD;  Location: [place];  Service: Orthopedics;  Laterality: Left;  Revision multiple fingers index and middle   . Eus N/A 06/25/2013    Procedure: LOWER ENDOSCOPIC ULTRASOUND (EUS);  Surgeon: Arta Silence, MD;  Location: [facility];  Service: Endoscopy;  Laterality: N/A;  . Colonoscopy w/ biopsies  05/29/13  . Eus  06/25/13   HPI:  79 yo male adm to [facility]  found to have newly diagnosed rectal cancer.  Pt was tachypenic and has h/o ETOH use.  He is s/p surgery and swallow evaluation ordered.  Recent CXR 7/19 showed worse bilateral ATX, left more than right.  Per RN, surgery desires for eval to be done but wants pt to remain NPO for another day.     Assessment / Plan / Recommendation Clinical Impression  Pt without focal CN deficits - mildly increased work of breathing noted with exertion.  Whitish coating on tongue noted, ? if could be oral candidiasis.  Observed pt consumption of single ice chips, small boluses of applesauce and thin liquids.  Swallow was minimally delayed but without indications of aspiration/airway compromise (except with liquid via  straw).  Educated pt/spouse to precautions given pt's marginal increased work of breathing that could allow episodic aspiration.   Pt uses dentures for all eating and they are at home - Spouse to bring in dentures tomorrow.  SLP to follow up tomorrow for po tolerance (RN reports po diet not to start today) and to observe pt ability with solids.  Suspect marginal compromise due to pt's weakness/deconditioning/dysarthria and pt may benefit from dys3/thin diet.   Thanks for this consult.     Aspiration Risk  Mild    Diet Recommendation Thin liquid (liquids intiially and advance to dys3 when dentures arrive)   Liquid Administration via: Cup;No straw Medication Administration: Whole meds with liquid Supervision: Full supervision/cueing for compensatory strategies Compensations: Slow rate;Small sips/bites (rest breaks if short of breath) Postural Changes and/or Swallow Maneuvers: Seated upright 90 degrees;Upright 30-60 min after meal    Other  Recommendations Oral Care Recommendations: Oral care BID   Follow Up Recommendations    TBD   Frequency and Duration min 2x/week  1 week   Pertinent Vitals/Pain Afebrile,decreased     Swallow Study Prior Functional Status   pt denies premorbid dysphagia    General Date of Onset: 11/04/13 HPI: 79 yo male adm to [facility]  found to have newly diagnosed rectal cancer.  Pt was tachypenic and has h/o ETOH use.  He is s/p surgery and swallow evaluation ordered.  Recent CXR 7/19 showed worse bilateral ATX, left more than right.  Per RN, surgery  desires for eval to be done but wants pt to remain NPO for another day.   Type of Study: Bedside swallow evaluation Diet Prior to this Study: NPO Temperature Spikes Noted: No Respiratory Status: Nasal cannula Behavior/Cognition: Alert;Cooperative;Pleasant mood Oral Cavity - Dentition: Edentulous (pt's dentures are at home and spouse reports he wears them when he eats) Self-Feeding Abilities: Able to feed self Patient  Positioning: Upright in chair Baseline Vocal Quality: Clear Volitional Cough: Strong Volitional Swallow: Able to elicit    Oral/Motor/Sensory Function     Ice Chips Ice chips: Within functional limits Presentation: Spoon   Thin Liquid Thin Liquid: Impaired Presentation: Straw;Spoon;Cup Pharyngeal  Phase Impairments: Cough - Immediate Other Comments: cough after consumption via straw, ? due to secretions or aspiration    Nectar Thick Nectar Thick Liquid: Not tested   Honey Thick Honey Thick Liquid: Not tested   Puree Puree: Within functional limits Presentation: Spoon   Solid   GO    Solid: Not tested Other Comments: d/t pt edentulous status       Claudie Fisherman, [facility] SLP [phone]

## 2013-11-04 NOTE — Progress Notes (Signed)
7 Days Post-Op  Subjective: Much more awake and alert today. No complaints. Good uop. Pt pulled ng out  Objective: Vital signs in last 24 hours: Temp:  [97 F (36.1 C)-98.1 F (36.7 C)] 97.5 F (36.4 C) (07/21 1000) Pulse Rate:  [75-94] 87 (07/21 1000) Resp:  [16-25] 22 (07/21 1000) BP: (113-130)/(56-59) 113/59 mmHg (07/21 1000) SpO2:  [94 %-100 %] 99 % (07/21 1000) Arterial Line BP: (128-170)/(56-80) 170/68 mmHg (07/21 0600) Weight:  [181 lb 7 oz (82.3 kg)] 181 lb 7 oz (82.3 kg) (07/21 0458) Last BM Date: 11/04/13 (ileostomy)  Intake/Output from previous day: 07/20 0701 - 07/21 0700 In: 2913.3 [I.V.:2833.3; NG/GT:30; IV Piggyback:50] Out: 2055 [ZHYQM:5784; Drains:10; Stool:400] Intake/Output this shift: Total I/O In: 500 [I.V.:500] Out: 320 [Urine:185; Stool:135]  Resp: clear to auscultation bilaterally Cardio: regular rate and rhythm GI: softer, less distended. few bs. ostomy pink with small amount in bad  Lab Results:   Recent Labs  11/03/13 1300 11/04/13 0410  WBC 16.9* 16.7*  HGB 7.7* 7.3*  HCT 24.1* 22.8*  PLT 84* 76*   BMET  Recent Labs  11/03/13 0515 11/04/13 0410  NA 148* 147  K 4.5 4.1  CL 119* 119*  CO2 20 20  GLUCOSE 182* 161*  BUN 49* 41*  CREATININE 1.59* 1.21  CALCIUM 7.5* 7.6*   PT/INR No results found for this basename: LABPROT, INR,  in the last 72 hours ABG  Recent Labs  11/03/13 0310 11/04/13 0330  PHART 7.314* 7.343*  HCO3 18.3* 20.1    Studies/Results: Dg Chest Port 1 View  11/03/2013   CLINICAL DATA:  Smoker  EXAM: PORTABLE CHEST - 1 VIEW  COMPARISON:  11/03/2013  FINDINGS: Nasogastric tube coursing below the diaphragm and projecting over the stomach with the tip excluded from the field of view. Left-sided PICC line with the tip projecting over the SVC.  Trace bilateral pleural effusions. Mild interstitial thickening. No focal consolidation or pneumothorax. Stable cardiomediastinal silhouette.  Degenerative changes of  bilateral AC joints.  IMPRESSION: Mild pulmonary edema.   Electronically Signed   By: Kathreen Devoid   On: 11/03/2013 12:59   Dg Chest Port 1 View  11/03/2013   CLINICAL DATA:  Hypotension.  Tachypnea.  History of rectal cancer.  EXAM: PORTABLE CHEST - 1 VIEW  COMPARISON:  Single view of the chest 11/02/2013 and 10/31/2013.  FINDINGS: Support apparatus is unchanged. There is bibasilar airspace disease, worse on the left. No pneumothorax identified. There appears to be a small left pleural effusion. Heart size is upper normal.  IMPRESSION: Small left pleural effusion and bibasilar airspace disease, worse on the left, likely due to atelectasis.   Electronically Signed   By: Inge Rise M.D.   On: 11/03/2013 07:23   Dg Abd Portable 1v  11/03/2013   CLINICAL DATA:  Ileus.  Pneumoperitoneum peer  EXAM: PORTABLE ABDOMEN - 1 VIEW  COMPARISON:  None.  FINDINGS: Soft tissue structures unremarkable. Drainage catheter in stable position. Ostomy site right lower quadrant. Dilated loops of bowel are again noted. Findings suggesting pneumoperitoneum noted on standard KUB. Decubitus and/or upright view can be obtained for confirmation. Degenerative changes lumbar spine and both hips. Phleboliths.  IMPRESSION: 1. Findings again suggesting free intraperitoneal air,decubitus or upright view would be necessary for definitive confirmation. 2. Persistent unchanged bowel distention.   Electronically Signed   By: Marcello Moores  Register   On: 11/03/2013 13:05    Anti-infectives: Anti-infectives   Start     Dose/Rate Route Frequency Ordered  Stop   10/31/13 2300  ertapenem (INVANZ) 1 g in sodium chloride 0.9 % 50 mL IVPB     1 g 100 mL/hr over 30 Minutes Intravenous Daily at bedtime 10/31/13 2159     10/31/13 2200  ertapenem [facility]) injection 1 g  Status:  Discontinued     1 g Intramuscular Every 24 hours 10/31/13 2155 10/31/13 2158   10/19/2013 2200  cefoTEtan (CEFOTAN) 2 g in dextrose 5 % 50 mL IVPB     2 g 100 mL/hr over 30  Minutes Intravenous Every 12 hours 11/12/2013 1639 10/29/2013 2203   10/22/2013 0545  clindamycin (CLEOCIN) 900 mg, gentamicin (GARAMYCIN) 240 mg in sodium chloride 0.9 % 1,000 mL for intraperitoneal lavage  Status:  Discontinued    Comments:  Pharmacy may adjust dosing strength, schedule, rate of infusion, etc as needed to optimize therapy    Intraperitoneal To Surgery 10/22/2013 0536 10/15/2013 1625   11/04/2013 0536  cefoTEtan (CEFOTAN) 2 g in dextrose 5 % 50 mL IVPB     2 g 100 mL/hr over 30 Minutes Intravenous On call to O.R. 11/14/2013 0536 11/13/2013 1344      Assessment/Plan: s/p Procedure(s): MINIMALLY  INVASIVE ROBOT /LAPAROSCOPIC  REMOVAL OF PARTIAL COLON AND RECTUM LOW ANTERIOR RESECTION DIVERTING LOOP ILEOSTOMY RIGID PROTOSCOPY  (N/A) Continue bowel rest Leave ng out Continue Invanz OOB AKI resolving  LOS: 7 days    TOTH III,Fannie Alomar S 11/04/2013

## 2013-11-04 NOTE — Progress Notes (Signed)
PT Cancellation Note  Patient Details Name: Richard [name] MRN: [id number] DOB: 1934-11-06   Cancelled Treatment:    Reason Eval/Treat Not Completed: Medical issues which prohibited therapy (pt agitated, moved to ICU)   Claretha Cooper 11/04/2013, 8:02 AM

## 2013-11-04 NOTE — Progress Notes (Signed)
PULMONARY  / [place]   Name: Richard [name] MRN: [id number] DOB: [birth year]    ADMISSION DATE:  10/30/2013 CONSULTATION DATE: 10/31/13  REQUESTING CLINICIAN: Marcello Moores  CHIEF COMPLAINT/ REASON FOR CONSULT:  Hypotension, Tachypnea.    BRIEF PATIENT DESCRIPTION:  79 yo male smoker with hx of rectal cancer s/p admitted for removal of rectal mass on 7/14.  Developed hypotension, tachypnea 7/17 and PCCM consulted to assist with management.  He has hx of ETOH abuse.  SIGNIFICANT EVENTS: 7/14 Robotic low anterior resection with diverting loop ileostomy, bladder repair 7/17 Transfer to ICU with hypotension, tachypnea 7/19 Bladder pressure 24  STUDIES: 7/17 Abd xray >> pneumoperitoneum  LINES / TUBES: L PICC 7/15 >>  Rt radial aline 7/18 >> 7/21  CULTURES: Blood 7/17 >>   ANTIBIOTICS: Ertapenem 7/17 >>    SUBJECTIVE: Patient more alert and OOB to chair Patient removed NG and A-line overnight  VITAL SIGNS: Temp:  [97 F (36.1 C)-98.1 F (36.7 C)] 97.7 F (36.5 C) (07/21 0828) Pulse Rate:  [75-94] 81 (07/21 0800) Resp:  [16-25] 20 (07/21 0800) BP: (119-130)/(56-59) 130/59 mmHg (07/21 0800) SpO2:  [94 %-100 %] 100 % (07/21 0800) Arterial Line BP: (128-180)/(56-82) 170/68 mmHg (07/21 0600) Weight:  [82.3 kg (181 lb 7 oz)] 82.3 kg (181 lb 7 oz) (07/21 0458) INTAKE / OUTPUT: Intake/Output     07/20 0701 - 07/21 0700 07/21 0701 - 07/22 0700   I.V. (mL/kg) 2833.3 (34.4) 250 (3)   NG/GT 30    IV Piggyback 50    Total Intake(mL/kg) 2913.3 (35.4) 250 (3)   Urine (mL/kg/hr) 1645 (0.8) 100 (0.6)   Emesis/NG output     Drains 10 (0)    Stool 400 (0.2) 125 (0.7)   Total Output 2055 225   Net +858.3 +25         PHYSICAL EXAMINATION: General: ill appearing elderly male Neuro: alert, oriented to self, follows simple commands HEENT: PERRL Cardiovascular: regular, no murmur Lungs: scattered rhonchi, upper airway congestion Abdomen:  Less-distended, mild  tenderness, colostomy bag and drains in place Musculoskeletal: 2+ pitting edema in the LLQ Skin: ostomy pink, small amount of loose stool in bag  CBC Recent Labs     11/03/13  0515  11/03/13  1300  11/04/13  0410  WBC  14.8*  16.9*  16.7*  HGB  7.5*  7.7*  7.3*  HCT  23.3*  24.1*  22.8*  PLT  91*  84*  76*   BMET Recent Labs     11/02/13  0510  11/03/13  0515  11/04/13  0410  NA  144  148*  147  K  4.7  4.5  4.1  CL  113*  119*  119*  CO2  21  20  20   BUN  51*  49*  41*  CREATININE  2.61*  1.59*  1.21  GLUCOSE  165*  182*  161*    Electrolytes Recent Labs     11/02/13  0510  11/03/13  0515  11/04/13  0410  CALCIUM  7.5*  7.5*  7.6*  MG  3.0*   --   2.8*  PHOS  3.8   --   2.8   ABG Recent Labs     11/02/13  0850  11/03/13  0310  11/04/13  0330  PHART  7.314*  7.314*  7.343*  PCO2ART  39.4  37.1  38.0  PO2ART  80.8  75.1*  81.2    CBG (last  3)   Recent Labs  11/04/13 0047 11/04/13 0358 11/04/13 0812  GLUCAP 118* 142* 133*    Imaging Dg Chest Port 1 View  11/03/2013   CLINICAL DATA:  Smoker  EXAM: PORTABLE CHEST - 1 VIEW  COMPARISON:  11/03/2013  FINDINGS: Nasogastric tube coursing below the diaphragm and projecting over the stomach with the tip excluded from the field of view. Left-sided PICC line with the tip projecting over the SVC.  Trace bilateral pleural effusions. Mild interstitial thickening. No focal consolidation or pneumothorax. Stable cardiomediastinal silhouette.  Degenerative changes of bilateral AC joints.  IMPRESSION: Mild pulmonary edema.   Electronically Signed   By: Kathreen Devoid   On: 11/03/2013 12:59   Dg Chest Port 1 View  11/03/2013   CLINICAL DATA:  Hypotension.  Tachypnea.  History of rectal cancer.  EXAM: PORTABLE CHEST - 1 VIEW  COMPARISON:  Single view of the chest 11/02/2013 and 10/31/2013.  FINDINGS: Support apparatus is unchanged. There is bibasilar airspace disease, worse on the left. No pneumothorax identified. There  appears to be a small left pleural effusion. Heart size is upper normal.  IMPRESSION: Small left pleural effusion and bibasilar airspace disease, worse on the left, likely due to atelectasis.   Electronically Signed   By: Inge Rise M.D.   On: 11/03/2013 07:23   Dg Abd Portable 1v  11/03/2013   CLINICAL DATA:  Ileus.  Pneumoperitoneum peer  EXAM: PORTABLE ABDOMEN - 1 VIEW  COMPARISON:  None.  FINDINGS: Soft tissue structures unremarkable. Drainage catheter in stable position. Ostomy site right lower quadrant. Dilated loops of bowel are again noted. Findings suggesting pneumoperitoneum noted on standard KUB. Decubitus and/or upright view can be obtained for confirmation. Degenerative changes lumbar spine and both hips. Phleboliths.  IMPRESSION: 1. Findings again suggesting free intraperitoneal air,decubitus or upright view would be necessary for definitive confirmation. 2. Persistent unchanged bowel distention.   Electronically Signed   By: Marcello Moores  Register   On: 11/03/2013 13:05    ASSESSMENT / PLAN:  PULMONARY:  A: Acute hypoxic respiratory failure 2nd to presumed AECOPD and sepsis. Tobacco abuse. Mild pulm edema on cxr 7/20 Doing well on RA P:   Oxygen to keep SpO2 90 to 94% Continue pulmicort, scheduled BD's Begin prednisone taper 7/22 Bronchial hygiene   CARDIOVASCULAR A: Severe sepsis 2nd to peritonitis - improving P:   Goal SBP > 90, MAP > 65  RENAL A:  Acute kidney injury 2nd to sepsis (baseline creatinine 0.88 from 10/31/13) - improving 7/20 Oliguria. Hypernatremia - improving Metabolic acidosis - resolving P:   Monitor renal fx, urine outpt, electrolytes Decreased IVFs   GASTROINTESTINAL A: Rectal cancer s/p low anterior resection with diverting loop ileostomy with post-op course likely complicated by anastomotic leak >> increased bladder pressures noted 7/19. Protein calorie malnutrition. F/u abd x-ray 7/20: persistent intraperitoneal air - unchanged bowel  distention P:   Management per surgery Swallow eval 7/21, plan to remain NPO until 7/22 per surgery Protonix for SUP F/u abdominal xr should be upright if worsens clincally  HEMATOLOGIC A: Anemia of critical illness and chronic disease Thrombocytopenia P:   F/u CBC this afternoon and in AM Transfuse for Hb < 7 SQ heparin for DVT prevention SCDs  INFECTIOUS A: Severe sepsis likely due to peritonitis. Leukocytosis P:   Day 5 eratapenem Would consider adding second agent if febrile or clinically worsens  ENDOCRINE A: Hyperglycemia - improving with decreasing steroids P:   SSI  NEUROLOGIC A: Acute encephalopathy 2nd to  sepsis, hypoxia, renal failure - improving 7/21 Hx of ETOH. P:   Thiamine, folic acid, dextrose in IV fluid Prn fentanyl for post-op pain control  CC time 35 minutes  Lalla Brothers ACNP student  Summary: Mental status greatly improved. Speech eval and OOB to chair today. Will keep NPO until okay per surgery, possibly 7/22. Continue abx for abdominal process. Begin prednisone taper 7/22. If continues to improve clinically would be appropriate for SDU status 7/22.   Reviewed above, examined, and documentation changes made as needed.  He continues to improved.  Need to push bronchial hygiene.  F/u with speech for swallow evaluation.  Continue Abx.  Monitor mental status >> concern for ICU delirium.  Updated pt's wife at bedside.  Chesley Mires, MD [facility] Pulmonary/Critical Care 11/04/2013, 9:57 AM Pager:  [phone] After 3pm call: [phone]

## 2013-11-04 NOTE — Progress Notes (Signed)
CSW continuing to follow for disposition planning.  CSW visited pt room. Pt sleeping at this time. Pt wife not present at bedside.  Per RN, pt wife visits in the mornings. CSW will attempt to meet pt wife at bedside tomorrow morning.   CSW to continue to follow.  Alison Murray, MSW, [place] [employer] [phone]

## 2013-11-05 DIAGNOSIS — F17200 Nicotine dependence, unspecified, uncomplicated: Secondary | ICD-10-CM

## 2013-11-05 LAB — BASIC METABOLIC PANEL
ANION GAP: 9 (ref 5–15)
BUN: 39 mg/dL — ABNORMAL HIGH (ref 6–23)
CHLORIDE: 117 meq/L — AB (ref 96–112)
CO2: 22 mEq/L (ref 19–32)
Calcium: 7.7 mg/dL — ABNORMAL LOW (ref 8.4–10.5)
Creatinine, Ser: 0.98 mg/dL (ref 0.50–1.35)
GFR calc Af Amer: >90 mL/min (ref >90)
GFR calc non Af Amer: 78 mL/min — ABNORMAL LOW (ref >90)
Glucose, Bld: 114 mg/dL — ABNORMAL HIGH (ref 70–99)
POTASSIUM: 4.3 meq/L (ref 3.7–5.3)
SODIUM: 148 meq/L — AB (ref 137–147)

## 2013-11-05 LAB — GLUCOSE, CAPILLARY
GLUCOSE-CAPILLARY: 74 mg/dL (ref 70–99)
Glucose-Capillary: 96 mg/dL (ref 70–99)
Glucose-Capillary: 101 mg/dL — ABNORMAL HIGH (ref 70–99)
Glucose-Capillary: 107 mg/dL — ABNORMAL HIGH (ref 70–99)
Glucose-Capillary: 83 mg/dL (ref 70–99)

## 2013-11-05 LAB — CBC
HCT: 23.1 % — ABNORMAL LOW (ref 39.0–52.0)
HEMOGLOBIN: 7.5 g/dL — AB (ref 13.0–17.0)
MCH: 29.4 pg (ref 26.0–34.0)
MCHC: 32.5 g/dL (ref 30.0–36.0)
MCV: 90.6 fL (ref 78.0–100.0)
Platelets: 87 10*3/uL — ABNORMAL LOW (ref 150–400)
RBC: 2.55 MIL/uL — ABNORMAL LOW (ref 4.22–5.81)
RDW: 17.8 % — AB (ref 11.5–15.5)
WBC: 16.3 10*3/uL — AB (ref 4.0–10.5)

## 2013-11-05 MED ORDER — THIAMINE HCL 100 MG/ML IJ SOLN
100.0000 mg | Freq: Once | INTRAMUSCULAR | Status: AC
  Administered 2013-11-05: 100 mg via INTRAVENOUS
  Filled 2013-11-05: qty 2

## 2013-11-05 MED ORDER — TIOTROPIUM BROMIDE MONOHYDRATE 18 MCG IN CAPS
18.0000 ug | ORAL_CAPSULE | Freq: Every day | RESPIRATORY_TRACT | Status: DC
  Administered 2013-11-05: 18 ug via RESPIRATORY_TRACT
  Filled 2013-11-05: qty 5

## 2013-11-05 MED ORDER — PANTOPRAZOLE SODIUM 40 MG IV SOLR
40.0000 mg | Freq: Every day | INTRAVENOUS | Status: DC
  Administered 2013-11-05: 40 mg via INTRAVENOUS

## 2013-11-05 MED ORDER — PANTOPRAZOLE SODIUM 40 MG PO TBEC: 40.0000 mg | DELAYED_RELEASE_TABLET | Freq: Every day | ORAL | Status: DC

## 2013-11-05 MED ORDER — FOLIC ACID 5 MG/ML IJ SOLN
1.0000 mg | Freq: Once | INTRAMUSCULAR | Status: AC
  Administered 2013-11-05: 1 mg via INTRAVENOUS
  Filled 2013-11-05: qty 0.2

## 2013-11-05 MED ORDER — FOLIC ACID 1 MG PO TABS: 1.0000 mg | ORAL_TABLET | Freq: Every day | ORAL | Status: DC

## 2013-11-05 MED ORDER — PANTOPRAZOLE SODIUM 40 MG IV SOLR
40.0000 mg | INTRAVENOUS | Status: DC
  Filled 2013-11-05: qty 40

## 2013-11-05 MED ORDER — MOMETASONE FURO-FORMOTEROL FUM 100-5 MCG/ACT IN AERO
2.0000 | INHALATION_SPRAY | Freq: Two times a day (BID) | RESPIRATORY_TRACT | Status: DC
  Administered 2013-11-05 (×2): 2 via RESPIRATORY_TRACT
  Filled 2013-11-05: qty 8.8

## 2013-11-05 MED ORDER — VITAMIN B-1 100 MG PO TABS: 100.0000 mg | ORAL_TABLET | Freq: Every day | ORAL | Status: DC

## 2013-11-05 MED ORDER — PREDNISONE 20 MG PO TABS: 10.0000 mg | ORAL_TABLET | Freq: Every day | ORAL | Status: DC

## 2013-11-05 MED ORDER — NICOTINE 7 MG/24HR TD PT24
7.0000 mg | MEDICATED_PATCH | Freq: Every day | TRANSDERMAL | Status: DC
  Administered 2013-11-05: 7 mg via TRANSDERMAL
  Filled 2013-11-05 (×3): qty 1

## 2013-11-05 MED ORDER — METHYLPREDNISOLONE SODIUM SUCC 40 MG IJ SOLR
20.0000 mg | Freq: Once | INTRAMUSCULAR | Status: AC
  Administered 2013-11-05: 20 mg via INTRAVENOUS
  Filled 2013-11-05: qty 1

## 2013-11-05 NOTE — Progress Notes (Signed)
PULMONARY  / [place]   Name: Richard [name] MRN: [id number] DOB: 1934-05-27    ADMISSION DATE:  11/04/2013 CONSULTATION DATE: 10/31/13  REQUESTING CLINICIAN: Marcello Moores  CHIEF COMPLAINT/ REASON FOR CONSULT:  Hypotension, Tachypnea.    BRIEF PATIENT DESCRIPTION:  79 yo male smoker with hx of rectal cancer s/p admitted for removal of rectal mass on 7/14.  Developed hypotension, tachypnea 7/17 and PCCM consulted to assist with management.  He has hx of ETOH abuse.  SIGNIFICANT EVENTS: 7/14 Robotic low anterior resection with diverting loop ileostomy, bladder repair 7/17 Transfer to ICU with hypotension, tachypnea 7/19 Bladder pressure 24 7/21 Speech therapy >> thin liquids and advance to D3 when dentures in  STUDIES: 7/17 Abd xray >> pneumoperitoneum   SUBJECTIVE: Still has chest congestion.  Abd discomfort improved.  VITAL SIGNS: Temp:  [97.5 F (36.4 C)-97.9 F (36.6 C)] 97.9 F (36.6 C) (07/22 0600) Pulse Rate:  [72-89] 78 (07/22 0600) Resp:  [18-22] 20 (07/22 0600) BP: (112-144)/(50-71) 119/56 mmHg (07/22 0600) SpO2:  [91 %-100 %] 95 % (07/22 0754) Weight:  [185 lb 10 oz (84.2 kg)] 185 lb 10 oz (84.2 kg) (07/22 0512) INTAKE / OUTPUT: Intake/Output     07/21 0701 - 07/22 0700 07/22 0701 - 07/23 0700   I.V. (mL/kg) 1400 (16.6)    NG/GT     IV Piggyback 50    Total Intake(mL/kg) 1450 (17.2)    Urine (mL/kg/hr) 1425 (0.7)    Drains     Stool 135 (0.1)    Total Output 1560     Net -110           PHYSICAL EXAMINATION: General: sitting in chair Neuro: alert, oriented to self, follows simple commands HEENT: no sinus congestion Cardiovascular: regular, no murmur Lungs: scattered rhonchi Abdomen:  Less-distended, no tenderness, colostomy bag and drains in place Musculoskeletal: no edema Skin: no rashes  CBC Recent Labs     11/03/13  1300  11/04/13  0410  11/05/13  0530  WBC  16.9*  16.7*  16.3*  HGB  7.7*  7.3*  7.5*  HCT  24.1*  22.8*   23.1*  PLT  84*  76*  87*   BMET Recent Labs     11/03/13  0515  11/04/13  0410  11/05/13  0530  NA  148*  147  148*  K  4.5  4.1  4.3  CL  119*  119*  117*  CO2  20  20  22   BUN  49*  41*  39*  CREATININE  1.59*  1.21  0.98  GLUCOSE  182*  161*  114*    Electrolytes Recent Labs     11/03/13  0515  11/04/13  0410  11/05/13  0530  CALCIUM  7.5*  7.6*  7.7*  MG   --   2.8*   --   PHOS   --   2.8   --    ABG Recent Labs     11/03/13  0310  11/04/13  0330  PHART  7.314*  7.343*  PCO2ART  37.1  38.0  PO2ART  75.1*  81.2    CBG (last 3)   Recent Labs  11/04/13 1522 11/04/13 1902 11/05/13 0801  GLUCAP 113* 121* 96    Imaging Dg Chest Port 1 View  11/03/2013   CLINICAL DATA:  Smoker  EXAM: PORTABLE CHEST - 1 VIEW  COMPARISON:  11/03/2013  FINDINGS: Nasogastric tube coursing below the diaphragm and projecting  over the stomach with the tip excluded from the field of view. Left-sided PICC line with the tip projecting over the SVC.  Trace bilateral pleural effusions. Mild interstitial thickening. No focal consolidation or pneumothorax. Stable cardiomediastinal silhouette.  Degenerative changes of bilateral AC joints.  IMPRESSION: Mild pulmonary edema.   Electronically Signed   By: Kathreen Devoid   On: 11/03/2013 12:59   Dg Abd Portable 1v  11/03/2013   CLINICAL DATA:  Ileus.  Pneumoperitoneum peer  EXAM: PORTABLE ABDOMEN - 1 VIEW  COMPARISON:  None.  FINDINGS: Soft tissue structures unremarkable. Drainage catheter in stable position. Ostomy site right lower quadrant. Dilated loops of bowel are again noted. Findings suggesting pneumoperitoneum noted on standard KUB. Decubitus and/or upright view can be obtained for confirmation. Degenerative changes lumbar spine and both hips. Phleboliths.  IMPRESSION: 1. Findings again suggesting free intraperitoneal air,decubitus or upright view would be necessary for definitive confirmation. 2. Persistent unchanged bowel distention.    Electronically Signed   By: Marcello Moores  Register   On: 11/03/2013 13:05    ASSESSMENT / PLAN:  PULMONARY:  A: Acute hypoxic respiratory failure 2nd to presumed AECOPD and sepsis >> improving. Tobacco abuse. P:   Oxygen to keep SpO2 90 to 94% Change to dulera, spiriva 7/22 with prn albuterol D/c solumedrol 7/22 and change to prednisone 10 mg daily >> wean off over next few days Bronchial hygiene F/u CXR as needed Change nicotine patch to 7 mg on 7/22  CARDIOVASCULAR Lt PICC 7/15 >> A: Severe sepsis 2nd to peritonitis - improving P:   Monitor hemodynamics  RENAL A:  Acute kidney injury 2nd to sepsis (baseline creatinine 0.88 from 10/31/13) - resolved 7/22. Oliguria - resolved 7/22. Hypernatremia. Metabolic acidosis - resolved 7/22. P:   Continue D5 1/2 NS until ensured he has adequate oral intake Monitor renal fx, urine outpt, electrolytes  GASTROINTESTINAL A: Rectal cancer s/p low anterior resection with diverting loop ileostomy with post-op course likely complicated by anastomotic leak. Protein calorie malnutrition. Dysphagia. P:   Advance diet per surgery Post-op care per surgery Protonix for SUP >> likely can d/c soon  HEMATOLOGIC A: Anemia of critical illness and chronic disease. Thrombocytopenia. P:   F/u CBC Transfuse for Hb < 7 SQ heparin for DVT prevention SCDs  INFECTIOUS A: Severe sepsis likely due to peritonitis. Leukocytosis related to sepsis and steroids. P:   Eratapenem started 7/17, day 6/x F/u Blood cx 7/17 >>  ENDOCRINE A: Hyperglycemia - improving with decreasing steroids P:   Not needing insulin >> will d/c SSI  NEUROLOGIC A: Acute encephalopathy 2nd to sepsis, hypoxia, renal failure - improving. Hx of ETOH. P:   Thiamine, folic acid, dextrose in IV fluid for now Prn fentanyl for post-op pain control  Summary:  Slowly improving.  Need to push physical therapy.  Okay from pulmonary standpoint to transfer out of ICU.  Updated  pt's wife at bedside.  Chesley Mires, MD [facility] Pulmonary/Critical Care 11/05/2013, 9:16 AM Pager:  [phone] After 3pm call: [phone]

## 2013-11-05 NOTE — Progress Notes (Signed)
CPT not done due to pt resting/sleeping.

## 2013-11-05 NOTE — Progress Notes (Signed)
8 Days Post-Op  Subjective: More awake and alert but still confused with occasional hallucinations  Objective: Vital signs in last 24 hours: Temp:  [97.5 F (36.4 C)-97.9 F (36.6 C)] 97.5 F (36.4 C) (07/22 1100) Pulse Rate:  [70-83] 80 (07/22 1100) Resp:  [18-21] 19 (07/22 1000) BP: (112-144)/(46-71) 125/55 mmHg (07/22 1000) SpO2:  [91 %-100 %] 96 % (07/22 1127) Weight:  [185 lb 10 oz (84.2 kg)] 185 lb 10 oz (84.2 kg) (07/22 0512) Last BM Date: 11/05/13  Intake/Output from previous day: 07/21 0701 - 07/22 0700 In: 1450 [I.V.:1400; IV Piggyback:50] Out: 1829 [Urine:1425; Stool:135] Intake/Output this shift: Total I/O In: 300 [I.V.:300] Out: 500 [Urine:275; Stool:225]  Resp: rhonchi bilaterally Cardio: regular rate and rhythm GI: softer. less distended. ostomy pink with small amount of output  Lab Results:   Recent Labs  11/04/13 0410 11/05/13 0530  WBC 16.7* 16.3*  HGB 7.3* 7.5*  HCT 22.8* 23.1*  PLT 76* 87*   BMET  Recent Labs  11/04/13 0410 11/05/13 0530  NA 147 148*  K 4.1 4.3  CL 119* 117*  CO2 20 22  GLUCOSE 161* 114*  BUN 41* 39*  CREATININE 1.21 0.98  CALCIUM 7.6* 7.7*   PT/INR No results found for this basename: LABPROT, INR,  in the last 72 hours ABG  Recent Labs  11/03/13 0310 11/04/13 0330  PHART 7.314* 7.343*  HCO3 18.3* 20.1    Studies/Results: No results found.  Anti-infectives: Anti-infectives   Start     Dose/Rate Route Frequency Ordered Stop   10/31/13 2300  ertapenem (INVANZ) 1 g in sodium chloride 0.9 % 50 mL IVPB     1 g 100 mL/hr over 30 Minutes Intravenous Daily at bedtime 10/31/13 2159     10/31/13 2200  ertapenem [facility]) injection 1 g  Status:  Discontinued     1 g Intramuscular Every 24 hours 10/31/13 2155 10/31/13 2158   11/03/2013 2200  cefoTEtan (CEFOTAN) 2 g in dextrose 5 % 50 mL IVPB     2 g 100 mL/hr over 30 Minutes Intravenous Every 12 hours 10/22/2013 1639 10/19/2013 2203   10/15/2013 0545  clindamycin  (CLEOCIN) 900 mg, gentamicin (GARAMYCIN) 240 mg in sodium chloride 0.9 % 1,000 mL for intraperitoneal lavage  Status:  Discontinued    Comments:  Pharmacy may adjust dosing strength, schedule, rate of infusion, etc as needed to optimize therapy    Intraperitoneal To Surgery 10/17/2013 0536 11/03/2013 1625   10/23/2013 0536  cefoTEtan (CEFOTAN) 2 g in dextrose 5 % 50 mL IVPB     2 g 100 mL/hr over 30 Minutes Intravenous On call to O.R. 10/30/2013 0536 11/03/2013 1344      Assessment/Plan: s/p Procedure(s): MINIMALLY  INVASIVE ROBOT /LAPAROSCOPIC  REMOVAL OF PARTIAL COLON AND RECTUM LOW ANTERIOR RESECTION DIVERTING LOOP ILEOSTOMY RIGID PROTOSCOPY  (N/A) will start clears but keep meds IV for now Will transfer to telemetry Start PT Continue Invanz AKI resolving  LOS: 8 days    TOTH III,Kaelum Kissick S 11/05/2013

## 2013-11-05 NOTE — Progress Notes (Signed)
CSW continuing to follow for disposition planning.  CSW met with pt and pt wife at bedside.  CSW introduced self and explained role. CSW provided emotional support to pt wife as pt wife discussed that she never anticipated that pt would have to have a stay in the step down unit during hospitalization.   CSW discussed with pt wife recommendations for disposition and discussed that pt will likely benefit from short term rehab at SNF prior to returning home.   Pt wife expressed understanding and agreeable to [place] SNF search. CSW clarified pt wife questions about facilities and process.  Pt wife shared that she is hopeful that pt may progress enough to return home, but recognizes need to explore options for rehab at SNF.  CSW completed FL2 and initiated SNF search to [place].  CSW to follow up with pt wife tomorrow to provide SNF bed offers.  CSW to continue to follow to assist with pt disposition needs.  Hollie Bartus, MSW, LCSW Clinical Social Work [phone] 

## 2013-11-05 NOTE — Progress Notes (Signed)
Pt. Refused chest vest. RT worked with Pt on flutter.

## 2013-11-06 ENCOUNTER — Inpatient Hospital Stay (HOSPITAL_COMMUNITY): Payer: Medicare Other

## 2013-11-06 DIAGNOSIS — J9600 Acute respiratory failure, unspecified whether with hypoxia or hypercapnia: Secondary | ICD-10-CM

## 2013-11-06 LAB — CBC
HCT: 22.7 % — ABNORMAL LOW (ref 39.0–52.0)
HCT: 27.9 % — ABNORMAL LOW (ref 39.0–52.0)
HEMOGLOBIN: 9 g/dL — AB (ref 13.0–17.0)
Hemoglobin: 7.4 g/dL — ABNORMAL LOW (ref 13.0–17.0)
MCH: 29.2 pg (ref 26.0–34.0)
MCH: 28.6 pg (ref 26.0–34.0)
MCHC: 32.6 g/dL (ref 30.0–36.0)
MCHC: 32.3 g/dL (ref 30.0–36.0)
MCV: 89.7 fL (ref 78.0–100.0)
MCV: 88.6 fL (ref 78.0–100.0)
PLATELETS: 99 10*3/uL — AB (ref 150–400)
Platelets: 108 10*3/uL — ABNORMAL LOW (ref 150–400)
RBC: 2.53 MIL/uL — ABNORMAL LOW (ref 4.22–5.81)
RBC: 3.15 MIL/uL — AB (ref 4.22–5.81)
RDW: 17.8 % — AB (ref 11.5–15.5)
RDW: 17.9 % — ABNORMAL HIGH (ref 11.5–15.5)
WBC: 12.9 10*3/uL — AB (ref 4.0–10.5)
WBC: 11.2 10*3/uL — ABNORMAL HIGH (ref 4.0–10.5)

## 2013-11-06 LAB — BASIC METABOLIC PANEL
Anion gap: 9 (ref 5–15)
BUN: 32 mg/dL — ABNORMAL HIGH (ref 6–23)
CALCIUM: 7.4 mg/dL — AB (ref 8.4–10.5)
CO2: 22 mEq/L (ref 19–32)
Chloride: 115 mEq/L — ABNORMAL HIGH (ref 96–112)
Creatinine, Ser: 0.84 mg/dL (ref 0.50–1.35)
GFR, EST NON AFRICAN AMERICAN: 83 mL/min — AB (ref >90)
GLUCOSE: 260 mg/dL — AB (ref 70–99)
Potassium: 3.9 mEq/L (ref 3.7–5.3)
SODIUM: 146 meq/L (ref 137–147)

## 2013-11-06 LAB — BLOOD GAS, ARTERIAL
Acid-base deficit: 7.4 mmol/L — ABNORMAL HIGH (ref 0.0–2.0)
BICARBONATE: 18 meq/L — AB (ref 20.0–24.0)
Drawn by: 331471
FIO2: 0.5 %
MECHVT: 600 mL
O2 Saturation: 98.9 %
PATIENT TEMPERATURE: 98.6
PEEP: 5 cmH2O
RATE: 15 resp/min
TCO2: 17.2 mmol/L (ref 0–100)
pCO2 arterial: 38.2 mmHg (ref 35.0–45.0)
pH, Arterial: 7.295 — ABNORMAL LOW (ref 7.350–7.450)
pO2, Arterial: 128 mmHg — ABNORMAL HIGH (ref 80.0–100.0)

## 2013-11-06 LAB — PREPARE RBC (CROSSMATCH)

## 2013-11-06 LAB — PRO B NATRIURETIC PEPTIDE: Pro B Natriuretic peptide (BNP): 2016 pg/mL — ABNORMAL HIGH (ref 0–450)

## 2013-11-06 LAB — LACTIC ACID, PLASMA: Lactic Acid, Venous: 1.9 mmol/L (ref 0.5–2.2)

## 2013-11-06 MED ORDER — IPRATROPIUM-ALBUTEROL 0.5-2.5 (3) MG/3ML IN SOLN
RESPIRATORY_TRACT | Status: AC
  Filled 2013-11-06: qty 3

## 2013-11-06 MED ORDER — NALOXONE HCL 0.4 MG/ML IJ SOLN: 0.4000 mg | INTRAMUSCULAR | Status: DC | PRN

## 2013-11-06 MED ORDER — NALOXONE HCL 0.4 MG/ML IJ SOLN
0.4000 mg | Freq: Once | INTRAMUSCULAR | Status: AC
  Administered 2013-11-06: 0.4 mg via INTRAVENOUS
  Filled 2013-11-06: qty 1

## 2013-11-06 MED ORDER — LIDOCAINE HCL (CARDIAC) 20 MG/ML IV SOLN
INTRAVENOUS | Status: AC
Start: 2013-11-06 — End: 2013-11-07
  Filled 2013-11-06: qty 5

## 2013-11-06 MED ORDER — SODIUM CHLORIDE 0.9 % IV SOLN
0.0000 ug/h | INTRAVENOUS | Status: DC
  Administered 2013-11-06: 25 ug/h via INTRAVENOUS
  Filled 2013-11-06 (×2): qty 50

## 2013-11-06 MED ORDER — IOHEXOL 350 MG/ML SOLN
100.0000 mL | Freq: Once | INTRAVENOUS | Status: AC | PRN
  Administered 2013-11-06: 100 mL via INTRAVENOUS

## 2013-11-06 MED ORDER — NALOXONE HCL 0.4 MG/ML IJ SOLN
INTRAMUSCULAR | Status: AC
  Administered 2013-11-06: 0.4 mg via INTRAVENOUS
  Filled 2013-11-06: qty 1

## 2013-11-06 MED ORDER — VASOPRESSIN 20 UNIT/ML IJ SOLN
0.0300 [IU]/min | INTRAVENOUS | Status: DC
  Administered 2013-11-06: 0.03 [IU]/min via INTRAVENOUS
  Filled 2013-11-06 (×2): qty 2.5

## 2013-11-06 MED ORDER — BUDESONIDE 0.5 MG/2ML IN SUSP
0.5000 mg | Freq: Two times a day (BID) | RESPIRATORY_TRACT | Status: DC
  Administered 2013-11-06 – 2013-11-07 (×2): 0.5 mg via RESPIRATORY_TRACT
  Filled 2013-11-06 (×4): qty 2

## 2013-11-06 MED ORDER — NALOXONE HCL 0.4 MG/ML IJ SOLN
0.4000 mg | INTRAMUSCULAR | Status: DC | PRN
  Administered 2013-11-06: 0.4 mg via INTRAVENOUS

## 2013-11-06 MED ORDER — SODIUM CHLORIDE 0.9 % IV SOLN
INTRAVENOUS | Status: DC
  Administered 2013-11-06 – 2013-11-07 (×2): via INTRAVENOUS

## 2013-11-06 MED ORDER — MIDAZOLAM HCL 2 MG/2ML IJ SOLN
INTRAMUSCULAR | Status: AC
  Administered 2013-11-06: 15:00:00
  Filled 2013-11-06: qty 2

## 2013-11-06 MED ORDER — SODIUM CHLORIDE 0.9 % IV BOLUS (SEPSIS)
1000.0000 mL | INTRAVENOUS | Status: DC | PRN
  Administered 2013-11-06 (×4): 1000 mL via INTRAVENOUS

## 2013-11-06 MED ORDER — PHENYLEPHRINE HCL 10 MG/ML IJ SOLN
30.0000 ug/min | INTRAMUSCULAR | Status: DC
  Administered 2013-11-06: 140 ug/min via INTRAVENOUS
  Administered 2013-11-06 (×2): 30 ug/min via INTRAVENOUS
  Administered 2013-11-06: 90 ug/min via INTRAVENOUS
  Administered 2013-11-06: 20 ug/min via INTRAVENOUS
  Administered 2013-11-06: 90 ug/min via INTRAVENOUS
  Filled 2013-11-06 (×3): qty 1

## 2013-11-06 MED ORDER — FENTANYL BOLUS VIA INFUSION
25.0000 ug | INTRAVENOUS | Status: DC | PRN
  Filled 2013-11-06: qty 50

## 2013-11-06 MED ORDER — SODIUM CHLORIDE 0.9 % IV BOLUS (SEPSIS)
1000.0000 mL | Freq: Once | INTRAVENOUS | Status: AC
  Administered 2013-11-06: 1000 mL via INTRAVENOUS

## 2013-11-06 MED ORDER — ETOMIDATE 2 MG/ML IV SOLN
INTRAVENOUS | Status: AC
  Administered 2013-11-06: 20 mg
  Filled 2013-11-06: qty 20

## 2013-11-06 MED ORDER — SUCCINYLCHOLINE CHLORIDE 20 MG/ML IJ SOLN
INTRAMUSCULAR | Status: AC
  Filled 2013-11-06: qty 1

## 2013-11-06 MED ORDER — PANTOPRAZOLE SODIUM 40 MG IV SOLR
40.0000 mg | Freq: Every day | INTRAVENOUS | Status: DC
  Administered 2013-11-06: 40 mg via INTRAVENOUS
  Filled 2013-11-06 (×2): qty 40

## 2013-11-06 MED ORDER — DEXTROSE-NACL 5-0.45 % IV SOLN
INTRAVENOUS | Status: DC
Start: 2013-11-06 — End: 2013-11-07
  Administered 2013-11-06: 16:00:00 via INTRAVENOUS

## 2013-11-06 MED ORDER — IPRATROPIUM-ALBUTEROL 0.5-2.5 (3) MG/3ML IN SOLN
3.0000 mL | Freq: Four times a day (QID) | RESPIRATORY_TRACT | Status: DC
  Administered 2013-11-06 – 2013-11-07 (×5): 3 mL via RESPIRATORY_TRACT
  Filled 2013-11-06 (×4): qty 3

## 2013-11-06 MED ORDER — FENTANYL CITRATE 0.05 MG/ML IJ SOLN
INTRAMUSCULAR | Status: AC
  Filled 2013-11-06: qty 2

## 2013-11-06 MED ORDER — SODIUM CHLORIDE 0.9 % IV BOLUS (SEPSIS)
500.0000 mL | Freq: Once | INTRAVENOUS | Status: AC
  Administered 2013-11-06: 500 mL via INTRAVENOUS

## 2013-11-06 MED ORDER — ROCURONIUM BROMIDE 50 MG/5ML IV SOLN
INTRAVENOUS | Status: AC
  Administered 2013-11-06: 10 mg
  Filled 2013-11-06: qty 2

## 2013-11-06 MED ORDER — DEXTROSE 5 % IV SOLN
2.0000 ug/min | INTRAVENOUS | Status: DC
  Administered 2013-11-06: 18 ug/min via INTRAVENOUS
  Administered 2013-11-06: 25 ug/min via INTRAVENOUS
  Administered 2013-11-06: 40 ug/min via INTRAVENOUS
  Administered 2013-11-06: 45 ug/min via INTRAVENOUS
  Administered 2013-11-06: 30 ug/min via INTRAVENOUS
  Administered 2013-11-06: 15 ug/min via INTRAVENOUS
  Administered 2013-11-06: 18 ug/min via INTRAVENOUS
  Administered 2013-11-06: 10 ug/min via INTRAVENOUS
  Administered 2013-11-06 (×2): 40 ug/min via INTRAVENOUS
  Filled 2013-11-06 (×6): qty 4

## 2013-11-06 MED ORDER — FENTANYL CITRATE 0.05 MG/ML IJ SOLN
50.0000 ug | Freq: Once | INTRAMUSCULAR | Status: AC
  Administered 2013-11-06: 50 ug via INTRAVENOUS

## 2013-11-06 NOTE — Evaluation (Signed)
Physical Therapy Evaluation Patient Details Name: Richard [name] MRN: [id number] DOB: Feb 03, 1935 Today's Date: 11/06/2013   History of Present Illness  79 yo male s/p LAR diverting ileostomy bladder repair 10/18/2013. Hx of uretal cancer, kyphoscoliosis.   Clinical Impression  Pt will benefit from PT to address deficits below;  He is limited this date by cognition and agitation; discussed with RN, pt Sats 85% upon PT arrival, O2 replaced after discussion with RN;     Follow Up Recommendations SNF    Equipment Recommendations  Rolling walker with 5" wheels    Recommendations for Other Services       Precautions / Restrictions Precautions Precautions: Fall      Mobility  Bed Mobility               General bed mobility comments: pt uncooperative with attempts toward mobility; see cognition section  Transfers                    Ambulation/Gait                Stairs            Wheelchair Mobility    Modified Rankin (Stroke Patients Only)       Balance                                             Pertinent Vitals/Pain sats 85% on RA, O2 placed at 2L after discussion per RN    Home Living Family/patient expects to be discharged to:: Private residence Living Arrangements: Spouse/significant other;Children Available Help at Discharge: Family Type of Home: House Home Access: Stairs to enter   Technical brewer of Steps: 3          Prior Function Level of Independence: Independent               Hand Dominance        Extremity/Trunk Assessment   Upper Extremity Assessment: Defer to OT evaluation           Lower Extremity Assessment:  (c/o pain with left knee flexion)         Communication   Communication: No difficulties  Cognition Arousal/Alertness: Awake/alert Behavior During Therapy: WFL for tasks assessed/performed Overall Cognitive Status: Impaired/Different from baseline Area of  Impairment: Orientation;Attention;Following commands;Safety/judgement;Awareness;Problem solving Orientation Level: Disoriented to;Place;Time;Situation Current Attention Level: Sustained Memory: Decreased short-term memory Following Commands: Follows one step commands inconsistently Safety/Judgement: Decreased awareness of safety;Decreased awareness of deficits   Problem Solving: Slow processing;Decreased initiation;Difficulty sequencing;Requires verbal cues;Requires tactile cues      General Comments      Exercises        Assessment/Plan    PT Assessment Patient needs continued PT services  PT Diagnosis Difficulty walking;Generalized weakness   PT Problem List Decreased mobility;Decreased activity tolerance;Decreased strength;Decreased knowledge of use of DME;Decreased cognition  PT Treatment Interventions Gait training;Functional mobility training;DME instruction;Therapeutic activities;Therapeutic exercise;Patient/family education;Balance training   PT Goals (Current goals can be found in the Care Plan section) Acute Rehab PT Goals Patient Stated Goal: pt unable to state PT Goal Formulation: Patient unable to participate in goal setting Time For Goal Achievement: 11/13/13 Potential to Achieve Goals: Good    Frequency Min 3X/week   Barriers to discharge        Co-evaluation  End of Session   Activity Tolerance: Treatment limited secondary to medical complications (Comment);Other (comment) (agitated) Patient left: with call bell/phone within reach;in bed;with chair alarm set           Time: 9753-0051 PT Time Calculation (min): 16 min   Charges:   PT Evaluation $Initial PT Evaluation Tier I: 1 Procedure     PT G CodesKenyon Ana 11/23/13, 1:21 PM

## 2013-11-06 NOTE — Progress Notes (Signed)
Was called by nursing to respond to the patients room for acute respiratory failure and mental status changes.  BP had dropped as well as oxygen saturations.  He was started on [place] O2.  We called CCM to come and evaluate the patient and he was transferred back to the ICU for further management.    I ordered a stat CXR, troponins, BNP, ECG.  He had received 82mcg of fentanyl, thus we administered 0.4mg  Narcan.  We started Neo and gave him a liter bolus NS.  His BP was between SBP 45-75, but we were able to palpate good b/l femoral pulses.  Abdomen was slightly tender to palpation, BS heard, ostomy with brown think liquid output.  Lungs are course with low effort on expiration.  Will plan on CT angio to rule out PE, and CT abd/pelvis.

## 2013-11-06 NOTE — Procedures (Signed)
US guided drainage of large abscess collection in the left lower abdomen.  Greater than 1 liter of thick yellow fluid was removed.  A 14 French drain was placed.  Fluid sample sent for culture and gram stain.  No immediate complication.

## 2013-11-06 NOTE — Progress Notes (Signed)
PULMONARY  / [place]   Name: Richard [name] MRN: [id number] DOB: 1934-08-07    ADMISSION DATE:  10/25/2013 CONSULTATION DATE: 10/31/13  REQUESTING CLINICIAN: Marcello Moores  CHIEF COMPLAINT/ REASON FOR CONSULT:  Hypotension, Tachypnea.    BRIEF PATIENT DESCRIPTION:  79 yo male smoker with hx of rectal cancer s/p admitted for removal of rectal mass on 7/14.  Developed hypotension, tachypnea 7/17 and PCCM consulted to assist with management.  He has hx of ETOH abuse.  SIGNIFICANT EVENTS: 7/14 Robotic low anterior resection with diverting loop ileostomy, bladder repair 7/17 Transfer to ICU with hypotension, tachypnea 7/19 Bladder pressure 24 7/21 Speech therapy >> thin liquids and advance to D3 when dentures in 7/23: moved to Med/surg. Developed acute MS change, respiratory distress and hypotension. Initially thought due to fentanyl but did not seem to respond to narcan. He remained hypotensive & confused in spite of volume resuscitation. Placed on pressors, ECG unremarkable, cardiac enzymes sent. ABX widened. Transfused 1 unit for shock in setting of worsening anemia. Unclear what precipitating factor was. Intubated to facilitate CT imaging of chest/abd/pelvis    STUDIES: 7/17 Abd xray >> pneumoperitoneum   SUBJECTIVE: Feels better.  Still has cough with clear sputum.  Abdominal discomfort improving.  Tolerated liquids yesterday.  VITAL SIGNS: Temp:  [96.3 F (35.7 C)-100.2 F (37.9 C)] 99.7 F (37.6 C) (07/23 1520) Pulse Rate:  [32-115] 104 (07/23 1520) Resp:  [15-30] 15 (07/23 1520) BP: (55-192)/(32-175) 73/48 mmHg (07/23 1520) SpO2:  [82 %-100 %] 100 % (07/23 1520) FiO2 (%):  [50 %] 50 % (07/23 1501) Weight:  [81.3 kg (179 lb 3.7 oz)] 81.3 kg (179 lb 3.7 oz) (07/23 0439) INTAKE / OUTPUT: Intake/Output     07/22 0701 - 07/23 0700 07/23 0701 - 07/24 0700   I.V. (mL/kg) 1200 (14.8) 400 (4.9)   IV Piggyback 50    Total Intake(mL/kg) 1250 (15.4) 400 (4.9)    Urine (mL/kg/hr) 1560 (0.8)    Drains 110 (0.1)    Stool 1100 (0.6) 150 (0.2)   Total Output 2770 150   Net -1520 +250         PHYSICAL EXAMINATION: General: lethargic, in acute distress prior to intubation  Neuro: confused, sedated and intermittently agitated  HEENT: orally intubated  Cardiovascular: regular, no murmur Lungs: scattered rhonchi Abdomen:abd tenderness to gentle palp,  colostomy bag and drains in place Musculoskeletal: no edema Skin: no rashes  CBC Recent Labs     11/04/13  0410  11/05/13  0530  11/06/13  0420  WBC  16.7*  16.3*  12.9*  HGB  7.3*  7.5*  7.4*  HCT  22.8*  23.1*  22.7*  PLT  76*  87*  99*   BMET Recent Labs     11/04/13  0410  11/05/13  0530  11/06/13  0420  NA  147  148*  146  K  4.1  4.3  3.9  CL  119*  117*  115*  CO2  20  22  22   BUN  41*  39*  32*  CREATININE  1.21  0.98  0.84  GLUCOSE  161*  114*  260*    Electrolytes Recent Labs     11/04/13  0410  11/05/13  0530  11/06/13  0420  CALCIUM  7.6*  7.7*  7.4*  MG  2.8*   --    --   PHOS  2.8   --    --    ABG Recent Labs  11/04/13  0330  11/06/13  1345  PHART  7.343*  7.277*  PCO2ART  38.0  43.3  PO2ART  81.2  232.0*    CBG (last 3)   Recent Labs  11/05/13 0801 11/05/13 1225 11/05/13 1545  GLUCAP 96 74 83    Imaging Dg Chest Port 1 View  11/06/2013   CLINICAL DATA:  Short of breath.  Respiratory failure.  EXAM: PORTABLE CHEST - 1 VIEW  COMPARISON:  11/03/2013.  FINDINGS: Support apparatus: LEFT upper extremity PICC appears unchanged. Monitoring leads project over the chest.  Cardiomediastinal Silhouette:  Unchanged.  Lungs: LEFT-greater-than-RIGHT basilar atelectasis. No airspace disease. No pneumothorax. Retrocardiac density likely representing a combination of atelectasis and pleural effusion.  Effusions: Small bilateral effusions. LEFT-greater-than-RIGHT effusion.  Other:  The Chin is draped over the chest.  IMPRESSION: 1. Interval removal of enteric  tube. LEFT upper extremity PICC unchanged. 2. Small bilateral pleural effusions, LEFT-greater-than-RIGHT basilar atelectasis.   Electronically Signed   By: Dereck Ligas M.D.   On: 11/06/2013 14:13    ASSESSMENT / PLAN: OETT 7/23>>> A: Acute hypoxic respiratory failure 2nd to presumed AECOPD and sepsis >> improving. Tobacco abuse. Recurrent episode of acute respiratory failure 7/23. Seems to be metabolically driven  P:   Full vent support PAD protocol  F/u CXR and abg Transition to albut/atrov and brovana  Lt PICC 7/15 >> A: Severe sepsis 2nd to peritonitis - resolved. Shock. Etiology unclear 7/23. ? Sepsis ? Dehydration ? Cardiogenic  P:   Monitor hemodynamics Cycle CEs Transduce CVP Transfuse in the setting of shock and hgb 7.4  Transition to levophed MAP goal >65   A:  Hypernatremia - improving. Acute kidney injury 2nd to sepsis (baseline creatinine 0.88 from 10/31/13), oliguria, metabolic acidosis - resolved 7/22. P:   Cont D51/2 NS for MIVF Bolus w/ NS Avoid hypotension Monitor renal fx, urine outpt, electrolytes  A: Rectal cancer s/p low anterior resection with diverting loop ileostomy with post-op course likely complicated by anastomotic leak. Protein calorie malnutrition. Dysphagia. P:   Post-op care per surgery Resume PPI Hold diet for now   A: Anemia of critical illness and chronic disease. Thrombocytopenia related to critical illness and ETOH - stable. P:   F/u CBC Transfuse for Hb < 7 SQ heparin for DVT prevention SCDs F/u CBC, low threshold to transfuse given hypotension   A: Severe sepsis likely due to peritonitis. P:   Eratapenem started 7/17, day 7/x Abx: vanc, start date 7/23, day 0/x Abx: flagyl, start date 7/23, day 0/x F/u Blood cx 7/17 >> Re-imaging of abd/pelvis r/o abscess 7/23>>>   A: Deconditioning. P: Consulted PT/OT  A: Hx of ETOH. Acute encephalopathy: in setting of shock 7/23 P:   Thiamine, folic acid RASS goal  -2 Supportive care  Summary:  Had been improving. Then shortly after arriving to medical ward developed acute respiratory distress, MS change and shock. Had gotten 50 mcg of fentanyl but did not respond to Narcan. Called emergently to the Unit. BP remained in 60s in spite of fluid challenge. CXR w/out sig change. Surgical services at bedside. ? New cardiac event (although EKG not impressive), ? PE (although was on heparin [place]), also concern about new abscess formation as does c/o abd pain. He was not stable enough to image so we went ahead and intubated. Will stabalize w/ pressors and fluid, transduce CVP, widen ABX,  and get imaging of chest abd/pelvis. Will f/u on results.   CC time 40 min.  Patient seen  and examined, agree with above note.  I dictated the care and orders written for this patient under my direction.  Rush Farmer, MD [phone]

## 2013-11-06 NOTE — Procedures (Signed)
Arterial Catheter Insertion Procedure Note Tallie Hevia [id number] 01/09/1935  Procedure: Insertion of Arterial Catheter  Indications: Blood pressure monitoring  Procedure Details Consent: Risks of procedure as well as the alternatives and risks of each were explained to the (patient/caregiver).  Consent for procedure obtained. Time Out: Verified patient identification, verified procedure, site/side was marked, verified correct patient position, special equipment/implants available, medications/allergies/relevent history reviewed, required imaging and test results available.  Performed  Maximum sterile technique was used including antiseptics, cap, gloves, gown, hand hygiene, mask and sheet. Skin prep: Chlorhexidine; local anesthetic administered 20 gauge catheter was inserted into left femoral artery using the Seldinger technique.  Evaluation Blood flow good; BP tracing good. Complications: No apparent complications.   [name] ACNP student 11/06/2013  I was present and supervised the entire procedure.  Rush Farmer, M.D. [facility] Pulmonary/Critical Care Medicine. Pager: [phone]. After hours pager: [phone].

## 2013-11-06 NOTE — Consult Note (Signed)
WOC ostomy follow up Stoma type/location: RLQ, loop ileostomy Stomal assessment/size: 1 3/4" vertical oval shaped, pink, moist, budded Peristomal assessment: intact Treatment options for stomal/peristomal skin: none, however with liquid output will continue to use barrier ring to protect periwound skin from effluent  Output liquid, green, brwon Ostomy pouching: 2pc. 2 1/4" used from unit supplies.  Education provided: Pt looked at stoma, however does not seem able to participate and/or care to learn care of stoma.  Wife was here very early this am and plans to return late afternoon tom.  Will need teaching to be completed with wife or possibly daughter.   I attempted to explain rationale for ostomy creation and the basic care but he is not able to verbalize any of the steps back to me today.  [place] team will follow along for ostomy care and teaching.  Tavarious Freel [place] RN,CWOCN [phone]

## 2013-11-06 NOTE — Progress Notes (Signed)
Patient seen and examined.  Spoke with his wife and daughter. US guided drainage of left intra-abdominal abscess done at the bedside by Dr. Anselm Pancoast.  Approximately 1100 cc of light brown, malodorous pus evacuated.  He tolerated it well.  Will see how he does from this.

## 2013-11-06 NOTE — Progress Notes (Signed)
Pt not able to do flutter valve or MDI due to SOB and pain neb tx given.

## 2013-11-06 NOTE — Progress Notes (Signed)
eLink Physician-Brief Progress Note Patient Name: Richard [name] DOB: December 29, 1934 MRN: [id number]  Date of Service  11/06/2013   HPI/Events of Note   Shock agitation  eICU Interventions  Restraints D/c neo Use vaso + levophed Fentanyl gtt   Intervention Category Major Interventions: Shock - evaluation and management Minor Interventions: Agitation / anxiety - evaluation and management  Carole Deere 11/06/2013, 9:28 PM

## 2013-11-06 NOTE — Progress Notes (Addendum)
SLP Cancellation Note  Patient Details Name: Dillan Candela MRN: [id number] DOB: 01/18/1935   Cancelled treatment:       Reason Eval/Treat Not Completed: Patient's level of consciousness;Other (comment) (pt not adequately alert and currently has high RR therefore high aspiration risk and not appropriate for po and pt is NPO for cystogram- will reattempt at later time)   Luanna Salk, [facility] SLP [phone]

## 2013-11-06 NOTE — Procedures (Signed)
Intubation Procedure Note Richard [name] [id number] 12-Sep-1934  Procedure: Intubation Indications: Airway protection and maintenance  Procedure Details Consent: Risks of procedure as well as the alternatives and risks of each were explained to the (patient/caregiver).  Consent for procedure obtained. Time Out: Verified patient identification, verified procedure, site/side was marked, verified correct patient position, special equipment/implants available, medications/allergies/relevent history reviewed, required imaging and test results available.  Performed  Maximum sterile technique was used including antiseptics, cap, gloves, gown, hand hygiene, mask and sheet.  3, 7.5 tube, 24 at lip    Evaluation Hemodynamic Status: Transient hypotension treated with fluid; O2 sats: stable throughout Patient's Current Condition: stable Complications: No apparent complications Patient did tolerate procedure well. Chest X-ray ordered to verify placement.  CXR: pending.   Richard [name] ACNP studnet 11/06/2013  I was present and supervised entire procedure.  Rush Farmer, M.D. [facility] Pulmonary/Critical Care Medicine. Pager: [phone]. After hours pager: [phone].

## 2013-11-06 NOTE — Progress Notes (Signed)
Paged Dr Zella Richer- on call to view CT scan.

## 2013-11-06 NOTE — Progress Notes (Signed)
Upon receiving patient, he complained of pain and seemed very uncomfortable with labored breathing, O2 sat 97% on room air. Fentanyl given per order.  Patient did not react well to medicine and ICU charge nurse came back to check on him but he then started to stabilize.  Respiratory therapist came to bedside and recommended lasix and maybe repeat chest xray. Patient's condition then seemed to start to decline again, I paged Dr. Marlou Starks who ordered patient to be transferred back to ICU.

## 2013-11-06 NOTE — Progress Notes (Signed)
eLink Physician-Brief Progress Note Patient Name: Richard [name] DOB: 12-Jul-1934 MRN: [id number]  Date of Service  11/06/2013   HPI/Events of Note   Septic shock CT chest/abdomen/pelvis> mod to large pleural effusion, two large loculated fluid collections, free air noted  eICU Interventions  Discussed with Surgery, plan IR drainage now   Intervention Category Major Interventions: Infection - evaluation and management;Shock - evaluation and management  Delitha Elms 11/06/2013, 8:38 PM

## 2013-11-06 NOTE — Progress Notes (Signed)
PULMONARY  / [place]   Name: Richard [name] MRN: [id number] DOB: 1934/04/19    ADMISSION DATE:  11/06/2013 CONSULTATION DATE: 10/31/13  REQUESTING CLINICIAN: Marcello Moores  CHIEF COMPLAINT/ REASON FOR CONSULT:  Hypotension, Tachypnea.    BRIEF PATIENT DESCRIPTION:  79 yo male smoker with hx of rectal cancer s/p admitted for removal of rectal mass on 7/14.  Developed hypotension, tachypnea 7/17 and PCCM consulted to assist with management.  He has hx of ETOH abuse.  SIGNIFICANT EVENTS: 7/14 Robotic low anterior resection with diverting loop ileostomy, bladder repair 7/17 Transfer to ICU with hypotension, tachypnea 7/19 Bladder pressure 24 7/21 Speech therapy >> thin liquids and advance to D3 when dentures in  STUDIES: 7/17 Abd xray >> pneumoperitoneum   SUBJECTIVE: Feels better.  Still has cough with clear sputum.  Abdominal discomfort improving.  Tolerated liquids yesterday.  VITAL SIGNS: Temp:  [96.3 F (35.7 C)-98.1 F (36.7 C)] 98.1 F (36.7 C) (07/23 0700) Pulse Rate:  [32-81] 32 (07/23 0400) Resp:  [13-23] 18 (07/23 0700) BP: (103-149)/(45-85) 134/56 mmHg (07/23 0603) SpO2:  [94 %-100 %] 100 % (07/23 0400) Weight:  [179 lb 3.7 oz (81.3 kg)] 179 lb 3.7 oz (81.3 kg) (07/23 0439) INTAKE / OUTPUT: Intake/Output     07/22 0701 - 07/23 0700 07/23 0701 - 07/24 0700   I.V. (mL/kg) 1200 (14.8)    IV Piggyback 50    Total Intake(mL/kg) 1250 (15.4)    Urine (mL/kg/hr) 1560 (0.8)    Drains 110 (0.1)    Stool 1100 (0.6) 150 (1.4)   Total Output 2770 150   Net -1520 -150         PHYSICAL EXAMINATION: General: no distress Neuro: alert, normal strength HEENT: no sinus congestion Cardiovascular: regular, no murmur Lungs: scattered rhonchi Abdomen: no tenderness, colostomy bag and drains in place Musculoskeletal: no edema Skin: no rashes  CBC Recent Labs     11/04/13  0410  11/05/13  0530  11/06/13  0420  WBC  16.7*  16.3*  12.9*  HGB  7.3*   7.5*  7.4*  HCT  22.8*  23.1*  22.7*  PLT  76*  87*  99*   BMET Recent Labs     11/04/13  0410  11/05/13  0530  11/06/13  0420  NA  147  148*  146  K  4.1  4.3  3.9  CL  119*  117*  115*  CO2  20  22  22   BUN  41*  39*  32*  CREATININE  1.21  0.98  0.84  GLUCOSE  161*  114*  260*    Electrolytes Recent Labs     11/04/13  0410  11/05/13  0530  11/06/13  0420  CALCIUM  7.6*  7.7*  7.4*  MG  2.8*   --    --   PHOS  2.8   --    --    ABG Recent Labs     11/04/13  0330  PHART  7.343*  PCO2ART  38.0  PO2ART  81.2    CBG (last 3)   Recent Labs  11/05/13 0801 11/05/13 1225 11/05/13 1545  GLUCAP 96 74 83    Imaging No results found.  ASSESSMENT / PLAN:  A: Acute hypoxic respiratory failure 2nd to presumed AECOPD and sepsis >> improving. Tobacco abuse. P:   Oxygen to keep SpO2 90 to 94% Continue dulera, spiriva with prn albuterol Bronchial hygiene F/u CXR as needed Changed nicotine  patch to 7 mg on 7/22  Lt PICC 7/15 >> A: Severe sepsis 2nd to peritonitis - resolved. P:   Monitor hemodynamics  A:  Hypernatremia - improving. Acute kidney injury 2nd to sepsis (baseline creatinine 0.88 from 10/31/13), oliguria, metabolic acidosis - resolved 7/22. P:   Continue D5 1/2 NS until ensured he has adequate oral intake Monitor renal fx, urine outpt, electrolytes  A: Rectal cancer s/p low anterior resection with diverting loop ileostomy with post-op course likely complicated by anastomotic leak. Protein calorie malnutrition. Dysphagia. P:   Advance diet per surgery Post-op care per surgery D/c protonix >> SUP no longer indicated  A: Anemia of critical illness and chronic disease. Thrombocytopenia related to critical illness and ETOH - stable. P:   F/u CBC Transfuse for Hb < 7 SQ heparin for DVT prevention SCDs  A: Severe sepsis likely due to peritonitis. P:   Eratapenem started 7/17, day 7/x F/u Blood cx 7/17  >>  A: Deconditioning. P: Consulted PT/OT  A: Hx of ETOH. P:   Thiamine, folic acid, dextrose in IV fluid for now  Summary:  Continues to improve.  Will need physical therapy.  Okay to transfer out of ICU.  PCCM will continue to follow for pulmonary/medical management while in hospital.  Updated pt's wife at bedside.  Chesley Mires, MD [facility] Pulmonary/Critical Care 11/06/2013, 8:21 AM Pager:  [phone] After 3pm call: [phone]

## 2013-11-06 NOTE — Progress Notes (Signed)
CTs reviewed with Radiologist.  There are two large intra-abdominal abscesses present.  Given his acute decompensation and the fact that these are likely the cause of the decompensation, I feel the emergency drainage tonight is indicated.  After discussion with the critical care physician, I feel drainage would best be done percutaneously rather than operatively as he has a significant chance of not surviving a major operation.  Have spoken to Radiologist about this. Have discussed this with his wife.

## 2013-11-06 NOTE — Progress Notes (Signed)
CSW continuing to follow for disposition planning.   CSW met with pt and pt wife at bedside. CSW discussed with pt wife the SNF bed offers.   Pt wife stated that she is interested in either [facility] or [facility]. [facility] is currently considering and CSW discussed with pt wife that CSW will contact [facility] in order to determine if facility is able to definitely offer a bed. Pt wife expressed understanding and agreeable to CSW updating pt wife this afternoon about [facility] in order for pt wife to make decision.  CSW provided support to pt wife as pt wife discussed that she spoke with MD this morning and MD feels that rehab at SNF will be best option upon discharge.  CSW contacted [facility] and facility stated that they feel they can meet pt medical needs, but would need supplies to care for pt ileostomy until Tuesday when facility could get shipment in of supplies.  CSW contacted pt wife via telephone and left message in order to discuss if pt had ordered supplies for ileostomy prior to admissions since pt surgery was planned.   CSW to await return phone call from pt wife in order to further assist with pt discharge planning needs.   CSW to continue to follow.  Alison Murray, MSW, [place] [facility] [phone]

## 2013-11-06 NOTE — Progress Notes (Signed)
From CT trip. Will wean back down

## 2013-11-06 NOTE — Progress Notes (Signed)
9 Days Post-Op  Subjective: Awake and alert. Still confused but pleasant  Objective: Vital signs in last 24 hours: Temp:  [96.3 F (35.7 C)-98.1 F (36.7 C)] 98.1 F (36.7 C) (07/23 0700) Pulse Rate:  [32-81] 32 (07/23 0400) Resp:  [13-23] 18 (07/23 0700) BP: (103-149)/(45-85) 134/56 mmHg (07/23 0603) SpO2:  [94 %-100 %] 100 % (07/23 0400) Weight:  [179 lb 3.7 oz (81.3 kg)] 179 lb 3.7 oz (81.3 kg) (07/23 0439) Last BM Date: 11/05/13  Intake/Output from previous day: 07/22 0701 - 07/23 0700 In: 1250 [I.V.:1200; IV Piggyback:50] Out: 2770 [Urine:1560; Drains:110; Stool:1100] Intake/Output this shift: Total I/O In: -  Out: 150 [Stool:150]  Resp: rhonchi bilaterally Cardio: regular rate and rhythm GI: soft, nontender. less distended. ostomy pink and productive  Lab Results:   Recent Labs  11/05/13 0530 11/06/13 0420  WBC 16.3* 12.9*  HGB 7.5* 7.4*  HCT 23.1* 22.7*  PLT 87* 99*   BMET  Recent Labs  11/05/13 0530 11/06/13 0420  NA 148* 146  K 4.3 3.9  CL 117* 115*  CO2 22 22  GLUCOSE 114* 260*  BUN 39* 32*  CREATININE 0.98 0.84  CALCIUM 7.7* 7.4*   PT/INR No results found for this basename: LABPROT, INR,  in the last 72 hours ABG  Recent Labs  11/04/13 0330  PHART 7.343*  HCO3 20.1    Studies/Results: No results found.  Anti-infectives: Anti-infectives   Start     Dose/Rate Route Frequency Ordered Stop   10/31/13 2300  ertapenem (INVANZ) 1 g in sodium chloride 0.9 % 50 mL IVPB     1 g 100 mL/hr over 30 Minutes Intravenous Daily at bedtime 10/31/13 2159     10/31/13 2200  ertapenem [facility]) injection 1 g  Status:  Discontinued     1 g Intramuscular Every 24 hours 10/31/13 2155 10/31/13 2158   10/18/2013 2200  cefoTEtan (CEFOTAN) 2 g in dextrose 5 % 50 mL IVPB     2 g 100 mL/hr over 30 Minutes Intravenous Every 12 hours 11/04/2013 1639 10/17/2013 2203   11/08/2013 0545  clindamycin (CLEOCIN) 900 mg, gentamicin (GARAMYCIN) 240 mg in sodium chloride 0.9  % 1,000 mL for intraperitoneal lavage  Status:  Discontinued    Comments:  Pharmacy may adjust dosing strength, schedule, rate of infusion, etc as needed to optimize therapy    Intraperitoneal To Surgery 11/06/2013 0536 11/08/2013 1625   11/05/2013 0536  cefoTEtan (CEFOTAN) 2 g in dextrose 5 % 50 mL IVPB     2 g 100 mL/hr over 30 Minutes Intravenous On call to O.R. 10/18/2013 0536 10/30/2013 1344      Assessment/Plan: s/p Procedure(s): MINIMALLY  INVASIVE ROBOT /LAPAROSCOPIC  REMOVAL OF PARTIAL COLON AND RECTUM LOW ANTERIOR RESECTION DIVERTING LOOP ILEOSTOMY RIGID PROTOSCOPY  (N/A) Advance diet. Start full liquids today Cystogram today to evaluate bladder injury Continue Invanz PT eval Will probably need snf or rehab  LOS: 9 days    TOTH III,PAUL S 11/06/2013

## 2013-11-06 NOTE — Progress Notes (Signed)
Full report given to RN 4E. VSS. Pt stable for transport.

## 2013-11-07 ENCOUNTER — Encounter (HOSPITAL_COMMUNITY): Admission: RE | Disposition: E | Payer: Self-pay | Source: Ambulatory Visit | Attending: General Surgery

## 2013-11-07 ENCOUNTER — Inpatient Hospital Stay (HOSPITAL_COMMUNITY): Payer: Medicare Other

## 2013-11-07 ENCOUNTER — Encounter (HOSPITAL_COMMUNITY): Payer: Self-pay | Admitting: Anesthesiology

## 2013-11-07 ENCOUNTER — Encounter (HOSPITAL_COMMUNITY): Payer: Self-pay | Admitting: Certified Registered"

## 2013-11-07 DIAGNOSIS — A419 Sepsis, unspecified organism: Secondary | ICD-10-CM

## 2013-11-07 DIAGNOSIS — R6521 Severe sepsis with septic shock: Secondary | ICD-10-CM

## 2013-11-07 DIAGNOSIS — J9600 Acute respiratory failure, unspecified whether with hypoxia or hypercapnia: Secondary | ICD-10-CM

## 2013-11-07 LAB — RENAL FUNCTION PANEL
ALBUMIN: 0.6 g/dL — AB (ref 3.5–5.2)
ALBUMIN: 0.7 g/dL — AB (ref 3.5–5.2)
Anion gap: 28 — ABNORMAL HIGH (ref 5–15)
Anion gap: 28 — ABNORMAL HIGH (ref 5–15)
BUN: 36 mg/dL — ABNORMAL HIGH (ref 6–23)
BUN: 28 mg/dL — AB (ref 6–23)
CALCIUM: 6.2 mg/dL — AB (ref 8.4–10.5)
CO2: 10 meq/L — AB (ref 19–32)
CO2: 11 mEq/L — ABNORMAL LOW (ref 19–32)
CREATININE: 2.28 mg/dL — AB (ref 0.50–1.35)
Calcium: 6.2 mg/dL — CL (ref 8.4–10.5)
Chloride: 107 mEq/L (ref 96–112)
Chloride: 101 mEq/L (ref 96–112)
Creatinine, Ser: 1.79 mg/dL — ABNORMAL HIGH (ref 0.50–1.35)
GFR calc Af Amer: 41 mL/min — ABNORMAL LOW (ref >90)
GFR calc non Af Amer: 35 mL/min — ABNORMAL LOW (ref >90)
GFR, EST AFRICAN AMERICAN: 30 mL/min — AB (ref >90)
GFR, EST NON AFRICAN AMERICAN: 26 mL/min — AB (ref >90)
GLUCOSE: 159 mg/dL — AB (ref 70–99)
Glucose, Bld: 123 mg/dL — ABNORMAL HIGH (ref 70–99)
PHOSPHORUS: 9.5 mg/dL — AB (ref 2.3–4.6)
Phosphorus: 11.5 mg/dL — ABNORMAL HIGH (ref 2.3–4.6)
Potassium: 6.4 mEq/L — ABNORMAL HIGH (ref 3.7–5.3)
Potassium: 6.2 mEq/L — ABNORMAL HIGH (ref 3.7–5.3)
SODIUM: 140 meq/L (ref 137–147)
Sodium: 145 mEq/L (ref 137–147)

## 2013-11-07 LAB — BLOOD GAS, ARTERIAL
ACID-BASE DEFICIT: 6.3 mmol/L — AB (ref 0.0–2.0)
ACID-BASE DEFICIT: 24.4 mmol/L — AB (ref 0.0–2.0)
Acid-base deficit: 21.8 mmol/L — ABNORMAL HIGH (ref 0.0–2.0)
Acid-base deficit: 23.7 mmol/L — ABNORMAL HIGH (ref 0.0–2.0)
BICARBONATE: 19.6 meq/L — AB (ref 20.0–24.0)
BICARBONATE: 6.8 meq/L — AB (ref 20.0–24.0)
Bicarbonate: 6.2 mEq/L — ABNORMAL LOW (ref 20.0–24.0)
Bicarbonate: 5 mEq/L — ABNORMAL LOW (ref 20.0–24.0)
DRAWN BY: 39898
Drawn by: 331471
Drawn by: 331471
FIO2: 1 %
FIO2: 40 %
FIO2: 0.4 %
FIO2: 0.4 %
LHR: 15 {breaths}/min
LHR: 30 {breaths}/min
MECHVT: 600 mL
MECHVT: 600 mL
MECHVT: 600 mL
O2 SAT: 99.2 %
O2 SAT: 96.2 %
O2 Saturation: 97.4 %
O2 Saturation: 98.6 %
PATIENT TEMPERATURE: 98.6
PATIENT TEMPERATURE: 98.6
PEEP/CPAP: 5 cmH2O
PEEP: 5 cmH2O
PEEP: 5 cmH2O
PH ART: 7.084 — AB (ref 7.350–7.450)
PH ART: 7.08 — AB (ref 7.350–7.450)
PO2 ART: 232 mmHg — AB (ref 80.0–100.0)
PO2 ART: 204 mmHg — AB (ref 80.0–100.0)
Patient temperature: 98.6
Patient temperature: 98.6
RATE: 30 resp/min
TCO2: 18.8 mmol/L (ref 0–100)
TCO2: 6.5 mmol/L (ref 0–100)
TCO2: 6.9 mmol/L (ref 0–100)
TCO2: 5.1 mmol/L (ref 0–100)
pCO2 arterial: 43.3 mmHg (ref 35.0–45.0)
pCO2 arterial: 28.3 mmHg — ABNORMAL LOW (ref 35.0–45.0)
pCO2 arterial: 23.9 mmHg — ABNORMAL LOW (ref 35.0–45.0)
pCO2 arterial: 17.6 mmHg — CL (ref 35.0–45.0)
pH, Arterial: 7.277 — ABNORMAL LOW (ref 7.350–7.450)
pH, Arterial: 6.974 — CL (ref 7.350–7.450)
pO2, Arterial: 135 mmHg — ABNORMAL HIGH (ref 80.0–100.0)
pO2, Arterial: 136 mmHg — ABNORMAL HIGH (ref 80.0–100.0)

## 2013-11-07 LAB — BASIC METABOLIC PANEL
ANION GAP: 24 — AB (ref 5–15)
Anion gap: 20 — ABNORMAL HIGH (ref 5–15)
BUN: 37 mg/dL — ABNORMAL HIGH (ref 6–23)
BUN: 36 mg/dL — ABNORMAL HIGH (ref 6–23)
BUN: 38 mg/dL — AB (ref 6–23)
CALCIUM: 6.3 mg/dL — AB (ref 8.4–10.5)
CALCIUM: 6 mg/dL — AB (ref 8.4–10.5)
CALCIUM: 6.1 mg/dL — AB (ref 8.4–10.5)
CHLORIDE: 108 meq/L (ref 96–112)
CO2: 7 meq/L — AB (ref 19–32)
CO2: 7 mEq/L — CL (ref 19–32)
CO2: <7 mEq/L — CL (ref 19–32)
CREATININE: 2.3 mg/dL — AB (ref 0.50–1.35)
Chloride: 114 mEq/L — ABNORMAL HIGH (ref 96–112)
Chloride: 110 mEq/L (ref 96–112)
Creatinine, Ser: 2.15 mg/dL — ABNORMAL HIGH (ref 0.50–1.35)
Creatinine, Ser: 2.2 mg/dL — ABNORMAL HIGH (ref 0.50–1.35)
GFR calc Af Amer: 33 mL/min — ABNORMAL LOW (ref >90)
GFR calc non Af Amer: 28 mL/min — ABNORMAL LOW (ref >90)
GFR calc non Af Amer: 27 mL/min — ABNORMAL LOW (ref >90)
GFR, EST AFRICAN AMERICAN: 32 mL/min — AB (ref >90)
GFR, EST AFRICAN AMERICAN: 30 mL/min — AB (ref >90)
GFR, EST NON AFRICAN AMERICAN: 26 mL/min — AB (ref >90)
GLUCOSE: 81 mg/dL (ref 70–99)
Glucose, Bld: 72 mg/dL (ref 70–99)
Glucose, Bld: 98 mg/dL (ref 70–99)
Potassium: 5.6 mEq/L — ABNORMAL HIGH (ref 3.7–5.3)
Potassium: 5.6 mEq/L — ABNORMAL HIGH (ref 3.7–5.3)
Potassium: 6.7 mEq/L (ref 3.7–5.3)
Sodium: 141 mEq/L (ref 137–147)
Sodium: 139 mEq/L (ref 137–147)
Sodium: 140 mEq/L (ref 137–147)

## 2013-11-07 LAB — URINALYSIS, ROUTINE W REFLEX MICROSCOPIC
Bilirubin Urine: NEGATIVE
Glucose, UA: NEGATIVE mg/dL
Ketones, ur: NEGATIVE mg/dL
Leukocytes, UA: NEGATIVE
Nitrite: NEGATIVE
PH: 6.5 (ref 5.0–8.0)
Protein, ur: 100 mg/dL — AB
Specific Gravity, Urine: 1.013 (ref 1.005–1.030)
Urobilinogen, UA: 0.2 mg/dL (ref 0.0–1.0)

## 2013-11-07 LAB — TYPE AND SCREEN
ABO/RH(D): A NEG
Antibody Screen: NEGATIVE
Unit division: 0

## 2013-11-07 LAB — CULTURE, BLOOD (SINGLE)
Culture: NO GROWTH
Culture: NO GROWTH

## 2013-11-07 LAB — CBC
HEMATOCRIT: 34.6 % — AB (ref 39.0–52.0)
Hemoglobin: 10.6 g/dL — ABNORMAL LOW (ref 13.0–17.0)
MCH: 28.8 pg (ref 26.0–34.0)
MCHC: 30.6 g/dL (ref 30.0–36.0)
MCV: 94 fL (ref 78.0–100.0)
PLATELETS: 99 10*3/uL — AB (ref 150–400)
RBC: 3.68 MIL/uL — ABNORMAL LOW (ref 4.22–5.81)
RDW: 18.3 % — AB (ref 11.5–15.5)
WBC: 35.5 10*3/uL — ABNORMAL HIGH (ref 4.0–10.5)

## 2013-11-07 LAB — POCT I-STAT 3, ART BLOOD GAS (G3+)
Acid-base deficit: 16 mmol/L — ABNORMAL HIGH (ref 0.0–2.0)
Bicarbonate: 10.8 mEq/L — ABNORMAL LOW (ref 20.0–24.0)
O2 Saturation: 98 %
TCO2: 12 mmol/L (ref 0–100)
pCO2 arterial: 24.3 mmHg — ABNORMAL LOW (ref 35.0–45.0)
pH, Arterial: 7.241 — ABNORMAL LOW (ref 7.350–7.450)
pO2, Arterial: 113 mmHg — ABNORMAL HIGH (ref 80.0–100.0)

## 2013-11-07 LAB — GLUCOSE, CAPILLARY
Glucose-Capillary: 63 mg/dL — ABNORMAL LOW (ref 70–99)
Glucose-Capillary: 96 mg/dL (ref 70–99)

## 2013-11-07 LAB — URINE MICROSCOPIC-ADD ON

## 2013-11-07 LAB — LACTIC ACID, PLASMA: Lactic Acid, Venous: 9.9 mmol/L — ABNORMAL HIGH (ref 0.5–2.2)

## 2013-11-07 SURGERY — LAPAROTOMY, EXPLORATORY
Anesthesia: General

## 2013-11-07 MED ORDER — DEXTROSE 50 % IV SOLN: 50.0000 mL | Freq: Once | INTRAVENOUS | Status: DC | PRN

## 2013-11-07 MED ORDER — EPINEPHRINE HCL 1 MG/ML IJ SOLN
0.5000 ug/min | INTRAVENOUS | Status: DC
  Filled 2013-11-07: qty 1

## 2013-11-07 MED ORDER — SODIUM CHLORIDE 0.9 % FOR CRRT
INTRAVENOUS_CENTRAL | Status: DC | PRN
  Filled 2013-11-07 (×2): qty 1000

## 2013-11-07 MED ORDER — STERILE WATER FOR INJECTION IV SOLN
INTRAVENOUS | Status: DC
  Administered 2013-11-07: 16:00:00 via INTRAVENOUS_CENTRAL
  Filled 2013-11-07 (×7): qty 150

## 2013-11-07 MED ORDER — INSULIN ASPART 100 UNIT/ML ~~LOC~~ SOLN
10.0000 [IU] | Freq: Once | SUBCUTANEOUS | Status: AC
  Administered 2013-11-07: 10 [IU] via INTRAVENOUS

## 2013-11-07 MED ORDER — VORICONAZOLE 200 MG IV SOLR
500.0000 mg | Freq: Two times a day (BID) | INTRAVENOUS | Status: DC
  Administered 2013-11-07: 500 mg via INTRAVENOUS
  Filled 2013-11-07 (×2): qty 500

## 2013-11-07 MED ORDER — HYDROCORTISONE NA SUCCINATE PF 100 MG IJ SOLR
50.0000 mg | Freq: Four times a day (QID) | INTRAMUSCULAR | Status: DC
  Administered 2013-11-07: 50 mg via INTRAVENOUS
  Filled 2013-11-07 (×3): qty 1

## 2013-11-07 MED ORDER — SODIUM CHLORIDE 0.9 % IV BOLUS (SEPSIS)
500.0000 mL | Freq: Once | INTRAVENOUS | Status: AC
  Administered 2013-11-07: 500 mL via INTRAVENOUS

## 2013-11-07 MED ORDER — DEXTROSE 5 % IV SOLN
INTRAVENOUS | Status: DC
  Administered 2013-11-07 (×3): via INTRAVENOUS
  Filled 2013-11-07 (×8): qty 150

## 2013-11-07 MED ORDER — PRISMASOL BGK 4/2.5 32-4-2.5 MEQ/L IV SOLN
INTRAVENOUS | Status: DC
  Administered 2013-11-07: 16:00:00 via INTRAVENOUS_CENTRAL
  Filled 2013-11-07 (×7): qty 5000

## 2013-11-07 MED ORDER — SODIUM CHLORIDE 0.9 % IV SOLN
1.0000 g | Freq: Once | INTRAVENOUS | Status: AC
  Administered 2013-11-07: 1 g via INTRAVENOUS
  Filled 2013-11-07: qty 10

## 2013-11-07 MED ORDER — SODIUM BICARBONATE 8.4 % IV SOLN
INTRAVENOUS | Status: AC
  Filled 2013-11-07: qty 100

## 2013-11-07 MED ORDER — SODIUM BICARBONATE 8.4 % IV SOLN
100.0000 meq | Freq: Once | INTRAVENOUS | Status: AC
Start: 2013-11-07 — End: 2013-11-07
  Administered 2013-11-07: 100 meq via INTRAVENOUS

## 2013-11-07 MED ORDER — NOREPINEPHRINE BITARTRATE 1 MG/ML IV SOLN
2.0000 ug/min | INTRAVENOUS | Status: DC
  Administered 2013-11-07: 50 ug/min via INTRAVENOUS
  Administered 2013-11-07: 35 ug/min via INTRAVENOUS
  Filled 2013-11-07 (×4): qty 16

## 2013-11-07 MED ORDER — SODIUM BICARBONATE 8.4 % IV SOLN
INTRAVENOUS | Status: DC
Start: 2013-11-07 — End: 2013-11-07
  Filled 2013-11-07: qty 50

## 2013-11-07 MED ORDER — CALCIUM CHLORIDE 10 % IV SOLN
1.0000 g | Freq: Once | INTRAVENOUS | Status: AC
  Administered 2013-11-07: 1 g via INTRAVENOUS
  Filled 2013-11-07: qty 10

## 2013-11-07 MED ORDER — SODIUM CHLORIDE 0.9 % IV BOLUS (SEPSIS): 500.0000 mL | Freq: Once | INTRAVENOUS | Status: DC

## 2013-11-07 MED ORDER — SODIUM BICARBONATE 8.4 % IV SOLN
INTRAVENOUS | Status: AC
  Administered 2013-11-07: 50 mL
  Filled 2013-11-07: qty 50

## 2013-11-07 MED ORDER — STERILE WATER FOR INJECTION IV SOLN
INTRAVENOUS | Status: DC
  Administered 2013-11-07: 16:00:00 via INTRAVENOUS_CENTRAL
  Filled 2013-11-07 (×6): qty 150

## 2013-11-07 MED ORDER — ALBUTEROL SULFATE (2.5 MG/3ML) 0.083% IN NEBU
2.5000 mg | INHALATION_SOLUTION | RESPIRATORY_TRACT | Status: DC
Start: 2013-11-07 — End: 2013-11-08

## 2013-11-07 MED ORDER — HEPARIN SODIUM (PORCINE) 1000 UNIT/ML DIALYSIS
1000.0000 [IU] | INTRAMUSCULAR | Status: DC | PRN
Start: 2013-11-07 — End: 2013-11-08
  Filled 2013-11-07: qty 6

## 2013-11-07 MED ORDER — EPINEPHRINE HCL 1 MG/ML IJ SOLN: 0.5000 ug/min | INTRAVENOUS | Status: DC

## 2013-11-07 MED ORDER — DEXTROSE 50 % IV SOLN
50.0000 mL | Freq: Once | INTRAVENOUS | Status: AC
  Administered 2013-11-07: 50 mL via INTRAVENOUS
  Filled 2013-11-07: qty 50

## 2013-11-07 MED ORDER — DEXTROSE 50 % IV SOLN
INTRAVENOUS | Status: AC
  Administered 2013-11-07: 50 mL
  Filled 2013-11-07: qty 50

## 2013-11-07 MED ORDER — SODIUM CHLORIDE 0.9 % IV SOLN
100.0000 mg | INTRAVENOUS | Status: DC
  Filled 2013-11-07: qty 100

## 2013-11-07 MED ORDER — VORICONAZOLE 200 MG IV SOLR: 300.0000 mg | Freq: Two times a day (BID) | INTRAVENOUS | Status: DC

## 2013-11-07 MED ORDER — SODIUM CHLORIDE 0.9 % IV SOLN
500.0000 mg | Freq: Three times a day (TID) | INTRAVENOUS | Status: DC
  Administered 2013-11-07: 500 mg via INTRAVENOUS
  Filled 2013-11-07 (×2): qty 500

## 2013-11-07 MED ORDER — SODIUM BICARBONATE 8.4 % IV SOLN
50.0000 meq | Freq: Once | INTRAVENOUS | Status: AC
  Administered 2013-11-07: 50 meq via INTRAVENOUS

## 2013-11-07 MED ORDER — SODIUM BICARBONATE 8.4 % IV SOLN
INTRAVENOUS | Status: AC
  Administered 2013-11-07: 50 meq
  Filled 2013-11-07: qty 50

## 2013-11-07 MED ORDER — DEXTROSE 10 % IV SOLN
INTRAVENOUS | Status: DC
  Administered 2013-11-07: 1000 mL via INTRAVENOUS

## 2013-11-07 MED ORDER — VANCOMYCIN HCL IN DEXTROSE 1-5 GM/200ML-% IV SOLN
1000.0000 mg | INTRAVENOUS | Status: DC
  Administered 2013-11-07: 1000 mg via INTRAVENOUS
  Filled 2013-11-07: qty 200

## 2013-11-07 MED ORDER — DEXTROSE 50 % IV SOLN: 1.0000 | Freq: Once | INTRAVENOUS | Status: AC

## 2013-11-09 LAB — CULTURE, ROUTINE-ABSCESS

## 2013-11-11 ENCOUNTER — Ambulatory Visit: Payer: Medicare Other | Admitting: Oncology

## 2013-11-15 NOTE — Progress Notes (Signed)
Time of death 5, asystole on the monitor, strip placed in chart.  Pt without respiration, heart tones and pulses.  Pupils fixed and dilated, mottling present.  Verified absence of pulse, respiration and heart tones with Dell Ponto, RN.  Dr. Lake Bells notified of death.

## 2013-11-15 NOTE — Progress Notes (Signed)
Patient with no urine production thus far on night shift. Maintaining MAP >65. ELink called and made aware.

## 2013-11-15 NOTE — Procedures (Signed)
Central Venous hemodialysis Catheter Insertion Procedure Note Richard [name] [id number] October 19, 1934  Procedure: Insertion of Central Venous hemodialysis Catheter Indications: central access and possible need for dialysis   Procedure Details Consent: Risks of procedure as well as the alternatives and risks of each were explained to the (patient/caregiver).  Consent for procedure obtained. Time Out: Verified patient identification, verified procedure, site/side was marked, verified correct patient position, special equipment/implants available, medications/allergies/relevent history reviewed, required imaging and test results available.  Performed Real time [place] was used to ID and cannulate the vessel   Maximum sterile technique was used including antiseptics, cap, gloves, gown, hand hygiene, mask and sheet. Skin prep: Chlorhexidine; local anesthetic administered A antimicrobial bonded/coated triple lumen catheter was placed in the left internal jugular vein using the Seldinger technique.  Evaluation Blood flow good Complications: No apparent complications Patient did tolerate procedure well. Chest X-ray ordered to verify placement.  CXR: pending.  BABCOCK,PETE Nov 17, 2013, 11:17 AM  I was present for procedure.  Richard Mires, MD [facility] Pulmonary/Critical Care 11/17/13, 11:54 AM Pager:  [phone] After 3pm call: [phone]

## 2013-11-15 NOTE — Progress Notes (Signed)
Spoke with Dr. Jimmy Footman concerning patients critical blood gas values. Orders to increase rate to 30 and give two amps of bicarb. Vent changes have been made.

## 2013-11-15 NOTE — Progress Notes (Signed)
10 Days Post-Op  Subjective: Acidosis has worsened overnight. Radiology was able to perc drain largest abscess. Still on vent  Objective: Vital signs in last 24 hours: Temp:  [95.4 F (35.2 C)-100.2 F (37.9 C)] 95.4 F (35.2 C) (07/24 1413) Pulse Rate:  [28-115] 97 (07/24 1413) Resp:  [15-30] 30 (07/24 1413) BP: (61-192)/(32-175) 150/63 mmHg (07/24 1413) SpO2:  [64 %-100 %] 99 % (07/24 1413) Arterial Line BP: (72-141)/(33-81) 88/60 mmHg (07/24 1200) FiO2 (%):  [40 %-100 %] 40 % (07/24 1149) Weight:  [187 lb 2.7 oz (84.9 kg)] 187 lb 2.7 oz (84.9 kg) (07/24 0400) Last BM Date: 11/06/13  Intake/Output from previous day: 07/23 0701 - 07/24 0700 In: 9324 [I.V.:3926.5; Blood:347.5; IV Piggyback:5050] Out: 6834 [Urine:100; Drains:1285; Stool:150] Intake/Output this shift: Total I/O In: 2273.7 [I.V.:1923.7; IV Piggyback:350] Out: 0   Resp: rhonchi bilaterally Cardio: regular rate and rhythm GI: distended and quiet. ostomy slightly dusky  Lab Results:   Recent Labs  11/06/13 1530 2013-11-21 0530  WBC 11.2* 35.5*  HGB 9.0* 10.6*  HCT 27.9* 34.6*  PLT 108* 99*   BMET  Recent Labs  11/21/13 0835 Nov 21, 2013 1142  NA 139 140  K 5.6* 6.7*  CL 108 110  CO2 7* <7*  GLUCOSE 72 98  BUN 36* 38*  CREATININE 2.20* 2.30*  CALCIUM 6.0* 6.1*   PT/INR No results found for this basename: LABPROT, INR,  in the last 72 hours ABG  Recent Labs  2013-11-21 0847 2013-11-21 1134  PHART 7.084* 7.080*  HCO3 6.8* 5.0*    Studies/Results: Ct Abdomen Pelvis Wo Contrast  11/06/2013   CLINICAL DATA:  Suspected abdominal sepsis. Status post robotic laparoscopic partial colectomy and rectum with LAR and diverting loop ileostomy.  EXAM: CT CHEST WITH CONTRAST.  CT ABDOMEN, AND PELVIS WITHOUT CONTRAST  TECHNIQUE: Multidetector CT imaging of the chest, abdomen and pelvis was performed following the standard protocol during bolus administration of intravenous contrast.  CONTRAST:  170mL OMNIPAQUE  IOHEXOL 350 MG/ML SOLN  COMPARISON:  None.  Prior CT from 06/20/2013  FINDINGS: CT CHEST FINDINGS  Endotracheal tube in place with tip just above the carina. No pathologically enlarged mediastinal, hilar, or axillary lymph nodes are identified.  Left-sided central venous catheter partially visualized. Intrathoracic aorta is of normal caliber and appearance. Great vessels within normal limits.  Heart size within normal limits.  No pericardial effusion.  Pulmonary arterial tree is well opacified. No filling defect to suggest acute pulmonary embolism identified. Re-formatted imaging confirms these findings.  Moderate a large layering bilateral pleural effusions are present with associated atelectasis. Aerated portions of the lungs are clear without focal infiltrate. No pneumothorax. Centrilobular emphysematous changes noted within the bilateral upper lobes.  No acute osseous abnormality. No worrisome lytic or blastic osseous lesions.  CT ABDOMEN AND PELVIS FINDINGS  Limited noncontrast evaluation of the liver is unremarkable. Gallbladder within normal limits. No biliary ductal dilatation. Spleen within normal limits. Adrenal glands and pancreas demonstrate a normal unenhanced appearance.  Kidneys are equal in size. No nephrolithiasis or hydronephrosis. No hydroureter bilaterally.  Stomach within normal limits. Patient is status post partial colectomy with a right lower quadrant ileostomy seen. Percutaneous surgical drain extending via the right anterior abdomen in place with tip terminating in the lower pelvis.  A large loculated hypodense collection measuring 20.0 bite 18.4 x 10.0 cm seen within the left abdomen (series 2, image 49). Scattered foci of gas seen within this collection. Finding is most consistent with intra-abdominal abscess.  Additional  loculated collection measuring 6.8 x 5.0 x 10.6 cm seen within the right lower quadrant (series 2, image 71). The percutaneous drain does not traverse either of these  collections. Moderate volume free fluid present within the abdomen and pelvis. Few scattered foci of free intraperitoneal air are seen, which may be related to recent surgery.  Bladder is largely decompressed with a Foley catheter in place. Prostate grossly normal.  Diffuse anasarca present.  No acute osseous abnormality. No new worrisome lytic or blastic osseous lesions. Sclerotic focus within the L2 vertebral body is unchanged. Lucency within the L4 vertebral body also stable.  IMPRESSION: CT CHEST:  1. No CT evidence of acute pulmonary embolism. 2. Moderate to large layering bilateral pleural effusions with associated atelectasis. 3. Upper lobe predominant centrilobular emphysema. 4. Anasarca.  CT ABDOMEN AND PELVIS:  1. Large loculated collection with internal gas measuring 20.0 x 18.4 x 10.0 cm within the left abdomen, most consistent with abscess. 2. Second loculated collection with internal gas measuring 6.8 x 5.0 x 10.6 cm within the right lower quadrant, also most consistent with abscess. 3. Scattered foci of free intraperitoneal air. While this finding may be partly related to recent surgery, given the presence of the large abscess sees, possible bowel leak should be considered. 4. Sequelae of prior partial colectomy with low anterior resection and diverting right lower quadrant ileostomy. No evidence of obstruction. 5. Moderate volume ascites. 6. Percutaneous surgical drain in place. This does not traverse either of the loculated collections. 7. Diffuse anasarca. Critical Value/emergent results were called by telephone at the time of interpretation on 11/06/2013 at 7:53 pm to Dr. Kathalene Frames who verbally acknowledged these results.   Electronically Signed   By: Jeannine Boga M.D.   On: 11/06/2013 20:13   Ct Angio Chest Pe W/cm &/or Wo Cm  11/06/2013   CLINICAL DATA:  Suspected abdominal sepsis. Status post robotic laparoscopic partial colectomy and rectum with LAR and diverting loop ileostomy.   EXAM: CT CHEST WITH CONTRAST.  CT ABDOMEN, AND PELVIS WITHOUT CONTRAST  TECHNIQUE: Multidetector CT imaging of the chest, abdomen and pelvis was performed following the standard protocol during bolus administration of intravenous contrast.  CONTRAST:  154mL OMNIPAQUE IOHEXOL 350 MG/ML SOLN  COMPARISON:  None.  Prior CT from 06/20/2013  FINDINGS: CT CHEST FINDINGS  Endotracheal tube in place with tip just above the carina. No pathologically enlarged mediastinal, hilar, or axillary lymph nodes are identified.  Left-sided central venous catheter partially visualized. Intrathoracic aorta is of normal caliber and appearance. Great vessels within normal limits.  Heart size within normal limits.  No pericardial effusion.  Pulmonary arterial tree is well opacified. No filling defect to suggest acute pulmonary embolism identified. Re-formatted imaging confirms these findings.  Moderate a large layering bilateral pleural effusions are present with associated atelectasis. Aerated portions of the lungs are clear without focal infiltrate. No pneumothorax. Centrilobular emphysematous changes noted within the bilateral upper lobes.  No acute osseous abnormality. No worrisome lytic or blastic osseous lesions.  CT ABDOMEN AND PELVIS FINDINGS  Limited noncontrast evaluation of the liver is unremarkable. Gallbladder within normal limits. No biliary ductal dilatation. Spleen within normal limits. Adrenal glands and pancreas demonstrate a normal unenhanced appearance.  Kidneys are equal in size. No nephrolithiasis or hydronephrosis. No hydroureter bilaterally.  Stomach within normal limits. Patient is status post partial colectomy with a right lower quadrant ileostomy seen. Percutaneous surgical drain extending via the right anterior abdomen in place with tip terminating in the lower  pelvis.  A large loculated hypodense collection measuring 20.0 bite 18.4 x 10.0 cm seen within the left abdomen (series 2, image 49). Scattered foci of gas  seen within this collection. Finding is most consistent with intra-abdominal abscess.  Additional loculated collection measuring 6.8 x 5.0 x 10.6 cm seen within the right lower quadrant (series 2, image 71). The percutaneous drain does not traverse either of these collections. Moderate volume free fluid present within the abdomen and pelvis. Few scattered foci of free intraperitoneal air are seen, which may be related to recent surgery.  Bladder is largely decompressed with a Foley catheter in place. Prostate grossly normal.  Diffuse anasarca present.  No acute osseous abnormality. No new worrisome lytic or blastic osseous lesions. Sclerotic focus within the L2 vertebral body is unchanged. Lucency within the L4 vertebral body also stable.  IMPRESSION: CT CHEST:  1. No CT evidence of acute pulmonary embolism. 2. Moderate to large layering bilateral pleural effusions with associated atelectasis. 3. Upper lobe predominant centrilobular emphysema. 4. Anasarca.  CT ABDOMEN AND PELVIS:  1. Large loculated collection with internal gas measuring 20.0 x 18.4 x 10.0 cm within the left abdomen, most consistent with abscess. 2. Second loculated collection with internal gas measuring 6.8 x 5.0 x 10.6 cm within the right lower quadrant, also most consistent with abscess. 3. Scattered foci of free intraperitoneal air. While this finding may be partly related to recent surgery, given the presence of the large abscess sees, possible bowel leak should be considered. 4. Sequelae of prior partial colectomy with low anterior resection and diverting right lower quadrant ileostomy. No evidence of obstruction. 5. Moderate volume ascites. 6. Percutaneous surgical drain in place. This does not traverse either of the loculated collections. 7. Diffuse anasarca. Critical Value/emergent results were called by telephone at the time of interpretation on 11/06/2013 at 7:53 pm to Dr. Kathalene Frames who verbally acknowledged these results.    Electronically Signed   By: Jeannine Boga M.D.   On: 11/06/2013 20:13   [place] Abscess Drain  11/09/2013   CLINICAL DATA:  79 year old with postoperative abdominal fluid collections and concern for abscess collections.  EXAM: ULTRASOUND GUIDED PLACEMENT OF AN ABDOMINAL ABSCESS DRAIN  Physician: Stephan Minister. Anselm Pancoast, MD  FLUOROSCOPY TIME:  None  MEDICATIONS: None  ANESTHESIA/SEDATION: Patient was monitored by the intensive care unit nursing staff throughout the procedure.  PROCEDURE: Informed consent was obtained for an image guided drainage by the patient's wife. Ultrasound demonstrated a large complex fluid collection in the left lower abdomen. The left lower abdomen was prepped with Betadine and a sterile field was created. Skin was anesthetized 1% lidocaine. A Yueh catheter was directed to this fluid collection and thick yellow purulent fluid was aspirated. A stiff Amplatz wire was placed. Initially, a 10.2 [place] multipurpose drain was placed. However, only small amount of fluid could be removed because the fluid was so thick. This drain was removed over the Amplatz wire and exchanged for a 14 [place] multipurpose drain. Greater than 1 liter of yellowish brown thick purulent fluid was removed. Majority of the fluid collection was aspirated. Catheter was sutured to the skin and attached to a suction bulb. Fluid sample sent for culture.  FINDINGS: Complex fluid collection in the left lower abdomen with multiple echoes. Findings consistent with an abscess collection. Markedly decreased fluid in the left lower abdomen following aspiration of the purulent fluid. Large amount of fluid around the liver with a few septations.  COMPLICATIONS: None  IMPRESSION: Ultrasound-guided drain  placement in the left lower abdominal abscess. Greater than 1 liter of purulent fluid was removed.   Electronically Signed   By: Markus Daft M.D.   On: 2013-11-12 08:18   Dg Chest Port 1 View  2013/11/12   CLINICAL DATA:  Central venous  catheter placement at bedside.  EXAM: PORTABLE CHEST - 1 VIEW  COMPARISON:  Portable chest x-rays and CTA chest yesterday.  FINDINGS: Left jugular central venous catheter tips projected over the lower SVC at or near the cavoatrial junction. No evidence of pneumothorax or mediastinal hematoma. Left arm PICC tip remains projected over the lower SVC as well. Endotracheal tube tip in satisfactory position projecting approximately 3-4 cm above the carina.  Improved aeration in the right lower lobe since yesterday, as the right hemidiaphragm is now visible, though there are persistent airspace opacities at the right base. Stable dense consolidation in the left lower lobe with silhouetting of the left hemidiaphragm. No new pulmonary parenchymal abnormalities. Cardiac silhouette mildly enlarged but stable. Pulmonary vascularity normal without evidence of pulmonary edema.  IMPRESSION: 1. Left jugular central venous catheter tip projects over the lower SVC at or near the cavoatrial junction. 2. Remaining support apparatus satisfactory. 3. Improved aeration in the right lower lobe since yesterday, though moderate airspace opacities persist. Stable dense left lower lobe atelectasis and/or pneumonia. No new abnormalities.   Electronically Signed   By: Evangeline Dakin M.D.   On: 11/12/2013 11:59   Portable Chest Xray  11/06/2013   CLINICAL DATA:  Intubation.  EXAM: PORTABLE CHEST - 1 VIEW  COMPARISON:  11/06/2013.  FINDINGS: Endotracheal tube and PICC line in good anatomic position. Endotracheal tube tip is 2.9 cm above the carina. Cardiomegaly with bilateral interstitial prominence and pleural effusion noted consistent with congestive heart failure. No acute bony abnormality identified.  IMPRESSION: 1. Endotracheal tube and PICC line in good anatomic position. 2. Congestive heart failure with bilateral pulmonary edema and small pleural effusions.   Electronically Signed   By: Brass Castle   On: 11/06/2013 15:38   Dg  Chest Port 1 View  11/06/2013   CLINICAL DATA:  Short of breath.  Respiratory failure.  EXAM: PORTABLE CHEST - 1 VIEW  COMPARISON:  11/03/2013.  FINDINGS: Support apparatus: LEFT upper extremity PICC appears unchanged. Monitoring leads project over the chest.  Cardiomediastinal Silhouette:  Unchanged.  Lungs: LEFT-greater-than-RIGHT basilar atelectasis. No airspace disease. No pneumothorax. Retrocardiac density likely representing a combination of atelectasis and pleural effusion.  Effusions: Small bilateral effusions. LEFT-greater-than-RIGHT effusion.  Other:  The Chin is draped over the chest.  IMPRESSION: 1. Interval removal of enteric tube. LEFT upper extremity PICC unchanged. 2. Small bilateral pleural effusions, LEFT-greater-than-RIGHT basilar atelectasis.   Electronically Signed   By: Dereck Ligas M.D.   On: 11/06/2013 14:13    Anti-infectives: Anti-infectives   Start     Dose/Rate Route Frequency Ordered Stop   11/08/13 1000  voriconazole (VFEND) 300 mg in sodium chloride 0.9 % 100 mL IVPB     300 mg 65 mL/hr over 120 Minutes Intravenous Every 12 hours 2013-11-12 0945     11/12/2013 1000  vancomycin (VANCOCIN) IVPB 1000 mg/200 mL premix     1,000 mg 200 mL/hr over 60 Minutes Intravenous Every 24 hours 2013-11-12 0943     11/12/13 1000  voriconazole (VFEND) 500 mg in sodium chloride 0.9 % 100 mL IVPB     500 mg 75 mL/hr over 120 Minutes Intravenous Every 12 hours November 12, 2013 0945 11/08/13 0959  10/31/13 2300  ertapenem (INVANZ) 1 g in sodium chloride 0.9 % 50 mL IVPB     1 g 100 mL/hr over 30 Minutes Intravenous Daily at bedtime 10/31/13 2159     10/31/13 2200  ertapenem [facility]) injection 1 g  Status:  Discontinued     1 g Intramuscular Every 24 hours 10/31/13 2155 10/31/13 2158   11/04/2013 2200  cefoTEtan (CEFOTAN) 2 g in dextrose 5 % 50 mL IVPB     2 g 100 mL/hr over 30 Minutes Intravenous Every 12 hours 11/14/2013 1639 10/20/2013 2203   10/20/2013 0545  clindamycin (CLEOCIN) 900 mg, gentamicin  (GARAMYCIN) 240 mg in sodium chloride 0.9 % 1,000 mL for intraperitoneal lavage  Status:  Discontinued    Comments:  Pharmacy may adjust dosing strength, schedule, rate of infusion, etc as needed to optimize therapy    Intraperitoneal To Surgery 11/10/2013 0536 10/20/2013 1625   11/10/2013 0536  cefoTEtan (CEFOTAN) 2 g in dextrose 5 % 50 mL IVPB     2 g 100 mL/hr over 30 Minutes Intravenous On call to O.R. 10/21/2013 0536 11/02/2013 1344      Assessment/Plan: s/p Procedure(s): MINIMALLY  INVASIVE ROBOT /LAPAROSCOPIC  REMOVAL OF PARTIAL COLON AND RECTUM LOW ANTERIOR RESECTION DIVERTING LOOP ILEOSTOMY RIGID PROTOSCOPY  (N/A) CCM will continue with resuscitation. I have spoken with family about the different options and i feel the only thing we can offer surgically at this point is to open his abdomen, wash it out, and vac it. He is not stable enough to go to OR now. As the day has progressed he has developed worsening acidosis and hyperkalemia. At this point we will plan [facility]. If he improves then we may be able to take him to OR later. Prognosis is poor  LOS: 10 days    TOTH III,PAUL S 24-Nov-2013

## 2013-11-15 NOTE — Progress Notes (Signed)
Remaining 25 ml of fentanyl drip wasted in sink, witnessed by Dell Ponto RN

## 2013-11-15 NOTE — Progress Notes (Signed)
Clinical Social Work Department CLINICAL SOCIAL WORK PLACEMENT NOTE December 07, 2013  Patient:  [name],Richard  Account Number:  [id number] [place] date:  10/18/2013  Clinical Social Worker:  Ulyess Blossom  Date/time:  Dec 07, 2013 02:16 PM  Clinical Social Work is seeking post-discharge placement for this patient at the following level of care:   SKILLED NURSING   (*CSW will update this form in Epic as items are completed)   11/05/2013  Patient/family provided with [place] Department of Clinical Social Work's list of facilities offering this level of care within the geographic area requested by the patient (or if unable, by the patient's family).  11/05/2013  Patient/family informed of their freedom to choose among providers that offer the needed level of care, that participate in Medicare, Medicaid or managed care program needed by the patient, have an available bed and are willing to accept the patient.  11/05/2013  Patient/family informed of [facility]' ownership interest in [facility], as well as of the fact that they are under no obligation to receive care at this facility.  PASARR submitted to EDS on 11/05/2013 PASARR number received on 11/05/2013  FL2 transmitted to all facilities in geographic area requested by pt/family on  11/05/2013 FL2 transmitted to all facilities within larger geographic area on   Patient informed that his/her managed care company has contracts with or will negotiate with  certain facilities, including the following:     Patient/family informed of bed offers received:  11/06/2013 Patient chooses bed at  Physician recommends and patient chooses bed at    Patient to be transferred to  on   Patient to be transferred to facility by  Patient and family notified of transfer on  Name of family member notified:    The following physician request were entered in Epic:   Additional Comments:

## 2013-11-15 NOTE — Progress Notes (Signed)
Chaplain Note: Responded to RN's request for support for family following patient's death.  Provided grief care and emotional support for large family...pretty distraught. Stayed with family in room and waiting area until paged to a STEMI. Dorris Fetch, Chaplain

## 2013-11-15 NOTE — Progress Notes (Signed)
eLink Physician-Brief Progress Note Patient Name: Maitland Lesiak DOB: 07/12/1934 MRN: [id number]  Date of Service  November 20, 2013   HPI/Events of Note   Profound shock, acidosis  eICU Interventions  Bicarb push now Start hydrocortisone 50 q 6 abg now   Intervention Category Major Interventions: Shock - evaluation and management  MCQUAID, DOUGLAS 20-Nov-2013, 4:03 PM

## 2013-11-15 NOTE — Progress Notes (Signed)
Agree.  Will follow drain output.

## 2013-11-15 NOTE — Progress Notes (Signed)
NUTRITION FOLLOW UP  Intervention:   - Recommend TPN initiation as pt with concern for peritonitis source sepsis - RD to monitor plan of care   Nutrition Dx:   Inadequate oral intake related to inability to eat as evidenced by NPO - ongoing    Goal:   Advance diet as tolerated to diet per SLP/MD - not met, pt intubated  New goal: TPN initiation with goal to meet >90% of estimated nutritional needs   Monitor:   Weights, labs, vent status, TPN   Assessment:   Patient s/p ileostomy for rectal adenocarcinoma.   7/15: - Tolerating a light lunch with clear liquids, pureed soup and ice cream. To get a pureed dinner and advance to [facility] if passing flatus and continued tolerance of po intake.  - Good intake prior to admit. Patient had seen the [place] RD 07/28/13 secondary to weight loss and weight has increased since that time. UBW 159 lbs 07/16/13.   7/21: - Rapid response called 7/17 for hypotension, tachycardia, tachypnea. AXR concerning for possible anastomotic leak. Abd exam reveal distention with tenderness in lower abdomen. JP with feculent output. Concerning for anastomotic leak with sepsis. - Pt pulled out NGT 7/17 was replaced, pt pulled out again yesterday - Had SLP bedside swallow evaluation today, was noted to be at mild aspiration risk with recommendations for thin liquids initially then advance to dysphagia 3/thin when dentures arrive - Has been NPO since 7/18 - Per conversation with RN, pt had restraints removed this morning, placed 7/17 for agitation  - Pt currently sitting in chair, falling asleep, did not disturb   7/24: - Moved to Med/surg floor yesterday however developed acute mental status change, respiratory distress and hypotension - Was intubated yesterday to facilitate CT imaging of chest/abd/pelvis  - Found to have two large intra-abdominal abscesses that were drained last night, over 1L of thick yellow fluid removed  Patient is  currently intubated on ventilator support MV: 17.5 L/min Temp (24hrs), Avg:99.2 F (37.3 C), Min:96.3 F (35.7 C), Max:100.2 F (37.9 C)  Propofol: off  Potassium elevated   Height: Ht Readings from Last 1 Encounters:  11/02/13 5' 11.5" (1.816 m)    Weight Status:   Wt Readings from Last 1 Encounters:  2013-11-22 187 lb 2.7 oz (84.9 kg)  Admit wt         158 lb (71.6 kg) Net I/Os: +16.8L  Re-estimated needs:  Kcal: 2082 Protein: 110-130 gm  Fluid: per MD   Skin: Non-pitting RLE, LLE edema, +2 perineal edema  Diet Order: NPO   Intake/Output Summary (Last 24 hours) at 11/22/2013 0900 Last data filed at 11-22-2013 0600  Gross per 24 hour  Intake 9263.13 ml  Output   1385 ml  Net 7878.13 ml    Last BM: 7/23 from ileostomy   Labs:   Recent Labs Lab 10/31/13 1836  11/02/13 0510  11/04/13 0410 11/05/13 0530 11/06/13 0420 11/22/2013 0530  NA 139  --  144  < > 147 148* 146 141  K 5.4*  < > 4.7  < > 4.1 4.3 3.9 5.6*  CL 105  --  113*  < > 119* 117* 115* 114*  CO2 22  --  21  < > _0 7*  BUN 24*  --  51*  < > 41* 39* 32* 37*  CREATININE 2.08*  < > 2.61*  < > 1.21 0.98 0.84 2.15*  CALCIUM 8.2*  --  7.5*  < >  7.6* 7.7* 7.4* 6.3*  MG  --   --  3.0*  --  2.8*  --   --   --   PHOS  --   --  3.8  --  2.8  --   --   --   GLUCOSE 163*  --  165*  < > 161* 114* 260* 81  < > = values in this interval not displayed.  CBG (last 3)   Recent Labs  11/05/13 0801 11/05/13 1225 11/05/13 1545  GLUCAP 96 74 83    Scheduled Meds: . antiseptic oral rinse  15 mL Mouth Rinse q12n4p  . budesonide (PULMICORT) nebulizer solution  0.5 mg Nebulization BID  . calcium gluconate  1 g Intravenous Once  . chlorhexidine  15 mL Mouth Rinse BID  . ertapenem  1 g Intravenous QHS  . heparin subcutaneous  5,000 Units Subcutaneous 3 times per day  . ipratropium-albuterol  3 mL Nebulization Q6H  . lip balm  1 application Topical BID  . pantoprazole (PROTONIX) IV  40 mg Intravenous QHS   . sodium chloride  500 mL Intravenous Once    Continuous Infusions: . sodium chloride 10 mL/hr at December 06, 2013 0806  . fentaNYL infusion INTRAVENOUS 50 mcg/hr (12-06-13 0410)  . norepinephrine (LEVOPHED) Adult infusion 50 mcg/min (06-Dec-2013 0430)  . phenylephrine (NEO-SYNEPHRINE) Adult infusion Stopped (11/06/13 2203)  .  sodium bicarbonate  infusion 1000 mL 100 mL/hr at 06-Dec-2013 0555  . vasopressin (PITRESSIN) infusion - *FOR SHOCK* 0.03 Units/min (11/06/13 2200)     Carlis Stable MS, RD, LDN [phone] Pager [phone] Weekend/After Hours Pager

## 2013-11-15 NOTE — Progress Notes (Signed)
CRITICAL VALUE ALERT  Critical value received:  CO2 7, Ca 6.3  Date of notification:  11/17/2013 0630  Time of notification:  0630  Critical value read back:Yes.    Nurse who received alert:  Avon Gully RN  MD notified (1st page):  Dr. Jimmy Footman  Time of first page:  [phone]  Responding MD:  Dr. Jimmy Footman  Time MD responded:  [phone]

## 2013-11-15 NOTE — Progress Notes (Signed)
CSW continuing to follow.  CSW noted that pt now Intubated,sedated.  CSW went to nursing unit and family at bedside, but multiple medical professionals at bedside at this time assisting pt and discussing with pt wife.   Per RN, pt transferring to [facility]. Per MD note, Prognosis is poor at this time and goals of care will be discussed further depending on pt progression.   CSW to remain available to provide support. CSW will update [facility] CSW once pt transfers in order for [facility] CSW to continue to follow.   Alison Murray, MSW, [place] [facility] [phone]

## 2013-11-15 NOTE — Progress Notes (Signed)
10 Days Post-Op  Subjective:  Intubated,sedated; family in room  Objective: Vital signs in last 24 hours: Temp:  [95.5 F (35.3 C)-100.2 F (37.9 C)] 95.5 F (35.3 C) (07/24 1100) Pulse Rate:  [28-115] 30 (07/24 1100) Resp:  [15-30] 27 (07/24 1100) BP: (55-192)/(32-175) 80/53 mmHg (07/23 1525) SpO2:  [64 %-100 %] 64 % (07/24 1100) Arterial Line BP: (72-141)/(33-81) 91/54 mmHg (07/24 1100) FiO2 (%):  [40 %-100 %] 40 % (07/24 1149) Weight:  [187 lb 2.7 oz (84.9 kg)] 187 lb 2.7 oz (84.9 kg) (07/24 0400) Last BM Date: 11/06/13  Intake/Output from previous day: 07/23 0701 - 07/24 0700 In: 9324 [I.V.:3926.5; Blood:347.5; IV Piggyback:5050] Out: 2952 [Urine:100; Drains:1285; Stool:150] Intake/Output this shift: Total I/O In: 1171.8 [I.V.:1171.8] Out: 0   Left abd drain intact ,  Insertion site ok, output over 1 liter since placement thick beige colored fluid; cx's pend  Lab Results:   Recent Labs  11/06/13 1530 11-15-13 0530  WBC 11.2* 35.5*  HGB 9.0* 10.6*  HCT 27.9* 34.6*  PLT 108* 99*   BMET  Recent Labs  15-Nov-2013 0530 2013/11/15 0835  NA 141 139  K 5.6* 5.6*  CL 114* 108  CO2 7* 7*  GLUCOSE 81 72  BUN 37* 36*  CREATININE 2.15* 2.20*  CALCIUM 6.3* 6.0*   PT/INR No results found for this basename: LABPROT, INR,  in the last 72 hours ABG  Recent Labs  November 15, 2013 0847 Nov 15, 2013 1134  PHART 7.084* 7.080*  HCO3 6.8* 5.0*    Studies/Results: Ct Abdomen Pelvis Wo Contrast  11/06/2013   CLINICAL DATA:  Suspected abdominal sepsis. Status post robotic laparoscopic partial colectomy and rectum with LAR and diverting loop ileostomy.  EXAM: CT CHEST WITH CONTRAST.  CT ABDOMEN, AND PELVIS WITHOUT CONTRAST  TECHNIQUE: Multidetector CT imaging of the chest, abdomen and pelvis was performed following the standard protocol during bolus administration of intravenous contrast.  CONTRAST:  1107mL OMNIPAQUE IOHEXOL 350 MG/ML SOLN  COMPARISON:  None.  Prior CT from 06/20/2013   FINDINGS: CT CHEST FINDINGS  Endotracheal tube in place with tip just above the carina. No pathologically enlarged mediastinal, hilar, or axillary lymph nodes are identified.  Left-sided central venous catheter partially visualized. Intrathoracic aorta is of normal caliber and appearance. Great vessels within normal limits.  Heart size within normal limits.  No pericardial effusion.  Pulmonary arterial tree is well opacified. No filling defect to suggest acute pulmonary embolism identified. Re-formatted imaging confirms these findings.  Moderate a large layering bilateral pleural effusions are present with associated atelectasis. Aerated portions of the lungs are clear without focal infiltrate. No pneumothorax. Centrilobular emphysematous changes noted within the bilateral upper lobes.  No acute osseous abnormality. No worrisome lytic or blastic osseous lesions.  CT ABDOMEN AND PELVIS FINDINGS  Limited noncontrast evaluation of the liver is unremarkable. Gallbladder within normal limits. No biliary ductal dilatation. Spleen within normal limits. Adrenal glands and pancreas demonstrate a normal unenhanced appearance.  Kidneys are equal in size. No nephrolithiasis or hydronephrosis. No hydroureter bilaterally.  Stomach within normal limits. Patient is status post partial colectomy with a right lower quadrant ileostomy seen. Percutaneous surgical drain extending via the right anterior abdomen in place with tip terminating in the lower pelvis.  A large loculated hypodense collection measuring 20.0 bite 18.4 x 10.0 cm seen within the left abdomen (series 2, image 49). Scattered foci of gas seen within this collection. Finding is most consistent with intra-abdominal abscess.  Additional loculated collection measuring 6.8 x 5.0  x 10.6 cm seen within the right lower quadrant (series 2, image 71). The percutaneous drain does not traverse either of these collections. Moderate volume free fluid present within the abdomen and  pelvis. Few scattered foci of free intraperitoneal air are seen, which may be related to recent surgery.  Bladder is largely decompressed with a Foley catheter in place. Prostate grossly normal.  Diffuse anasarca present.  No acute osseous abnormality. No new worrisome lytic or blastic osseous lesions. Sclerotic focus within the L2 vertebral body is unchanged. Lucency within the L4 vertebral body also stable.  IMPRESSION: CT CHEST:  1. No CT evidence of acute pulmonary embolism. 2. Moderate to large layering bilateral pleural effusions with associated atelectasis. 3. Upper lobe predominant centrilobular emphysema. 4. Anasarca.  CT ABDOMEN AND PELVIS:  1. Large loculated collection with internal gas measuring 20.0 x 18.4 x 10.0 cm within the left abdomen, most consistent with abscess. 2. Second loculated collection with internal gas measuring 6.8 x 5.0 x 10.6 cm within the right lower quadrant, also most consistent with abscess. 3. Scattered foci of free intraperitoneal air. While this finding may be partly related to recent surgery, given the presence of the large abscess sees, possible bowel leak should be considered. 4. Sequelae of prior partial colectomy with low anterior resection and diverting right lower quadrant ileostomy. No evidence of obstruction. 5. Moderate volume ascites. 6. Percutaneous surgical drain in place. This does not traverse either of the loculated collections. 7. Diffuse anasarca. Critical Value/emergent results were called by telephone at the time of interpretation on 11/06/2013 at 7:53 pm to Dr. Kathalene Frames who verbally acknowledged these results.   Electronically Signed   By: Jeannine Boga M.D.   On: 11/06/2013 20:13   Ct Angio Chest Pe W/cm &/or Wo Cm  11/06/2013   CLINICAL DATA:  Suspected abdominal sepsis. Status post robotic laparoscopic partial colectomy and rectum with LAR and diverting loop ileostomy.  EXAM: CT CHEST WITH CONTRAST.  CT ABDOMEN, AND PELVIS WITHOUT CONTRAST   TECHNIQUE: Multidetector CT imaging of the chest, abdomen and pelvis was performed following the standard protocol during bolus administration of intravenous contrast.  CONTRAST:  186mL OMNIPAQUE IOHEXOL 350 MG/ML SOLN  COMPARISON:  None.  Prior CT from 06/20/2013  FINDINGS: CT CHEST FINDINGS  Endotracheal tube in place with tip just above the carina. No pathologically enlarged mediastinal, hilar, or axillary lymph nodes are identified.  Left-sided central venous catheter partially visualized. Intrathoracic aorta is of normal caliber and appearance. Great vessels within normal limits.  Heart size within normal limits.  No pericardial effusion.  Pulmonary arterial tree is well opacified. No filling defect to suggest acute pulmonary embolism identified. Re-formatted imaging confirms these findings.  Moderate a large layering bilateral pleural effusions are present with associated atelectasis. Aerated portions of the lungs are clear without focal infiltrate. No pneumothorax. Centrilobular emphysematous changes noted within the bilateral upper lobes.  No acute osseous abnormality. No worrisome lytic or blastic osseous lesions.  CT ABDOMEN AND PELVIS FINDINGS  Limited noncontrast evaluation of the liver is unremarkable. Gallbladder within normal limits. No biliary ductal dilatation. Spleen within normal limits. Adrenal glands and pancreas demonstrate a normal unenhanced appearance.  Kidneys are equal in size. No nephrolithiasis or hydronephrosis. No hydroureter bilaterally.  Stomach within normal limits. Patient is status post partial colectomy with a right lower quadrant ileostomy seen. Percutaneous surgical drain extending via the right anterior abdomen in place with tip terminating in the lower pelvis.  A large loculated hypodense  collection measuring 20.0 bite 18.4 x 10.0 cm seen within the left abdomen (series 2, image 49). Scattered foci of gas seen within this collection. Finding is most consistent with  intra-abdominal abscess.  Additional loculated collection measuring 6.8 x 5.0 x 10.6 cm seen within the right lower quadrant (series 2, image 71). The percutaneous drain does not traverse either of these collections. Moderate volume free fluid present within the abdomen and pelvis. Few scattered foci of free intraperitoneal air are seen, which may be related to recent surgery.  Bladder is largely decompressed with a Foley catheter in place. Prostate grossly normal.  Diffuse anasarca present.  No acute osseous abnormality. No new worrisome lytic or blastic osseous lesions. Sclerotic focus within the L2 vertebral body is unchanged. Lucency within the L4 vertebral body also stable.  IMPRESSION: CT CHEST:  1. No CT evidence of acute pulmonary embolism. 2. Moderate to large layering bilateral pleural effusions with associated atelectasis. 3. Upper lobe predominant centrilobular emphysema. 4. Anasarca.  CT ABDOMEN AND PELVIS:  1. Large loculated collection with internal gas measuring 20.0 x 18.4 x 10.0 cm within the left abdomen, most consistent with abscess. 2. Second loculated collection with internal gas measuring 6.8 x 5.0 x 10.6 cm within the right lower quadrant, also most consistent with abscess. 3. Scattered foci of free intraperitoneal air. While this finding may be partly related to recent surgery, given the presence of the large abscess sees, possible bowel leak should be considered. 4. Sequelae of prior partial colectomy with low anterior resection and diverting right lower quadrant ileostomy. No evidence of obstruction. 5. Moderate volume ascites. 6. Percutaneous surgical drain in place. This does not traverse either of the loculated collections. 7. Diffuse anasarca. Critical Value/emergent results were called by telephone at the time of interpretation on 11/06/2013 at 7:53 pm to Dr. Kathalene Frames who verbally acknowledged these results.   Electronically Signed   By: Jeannine Boga M.D.   On: 11/06/2013  20:13   [place] Abscess Drain  11/28/2013   CLINICAL DATA:  79 year old with postoperative abdominal fluid collections and concern for abscess collections.  EXAM: ULTRASOUND GUIDED PLACEMENT OF AN ABDOMINAL ABSCESS DRAIN  Physician: Stephan Minister. Anselm Pancoast, MD  FLUOROSCOPY TIME:  None  MEDICATIONS: None  ANESTHESIA/SEDATION: Patient was monitored by the intensive care unit nursing staff throughout the procedure.  PROCEDURE: Informed consent was obtained for an image guided drainage by the patient's wife. Ultrasound demonstrated a large complex fluid collection in the left lower abdomen. The left lower abdomen was prepped with Betadine and a sterile field was created. Skin was anesthetized 1% lidocaine. A Yueh catheter was directed to this fluid collection and thick yellow purulent fluid was aspirated. A stiff Amplatz wire was placed. Initially, a 10.2 [place] multipurpose drain was placed. However, only small amount of fluid could be removed because the fluid was so thick. This drain was removed over the Amplatz wire and exchanged for a 14 [place] multipurpose drain. Greater than 1 liter of yellowish brown thick purulent fluid was removed. Majority of the fluid collection was aspirated. Catheter was sutured to the skin and attached to a suction bulb. Fluid sample sent for culture.  FINDINGS: Complex fluid collection in the left lower abdomen with multiple echoes. Findings consistent with an abscess collection. Markedly decreased fluid in the left lower abdomen following aspiration of the purulent fluid. Large amount of fluid around the liver with a few septations.  COMPLICATIONS: None  IMPRESSION: Ultrasound-guided drain placement in the left lower abdominal  abscess. Greater than 1 liter of purulent fluid was removed.   Electronically Signed   By: Markus Daft M.D.   On: 2013-11-14 08:18   Dg Chest Port 1 View  14-Nov-2013   CLINICAL DATA:  Central venous catheter placement at bedside.  EXAM: PORTABLE CHEST - 1 VIEW  COMPARISON:   Portable chest x-rays and CTA chest yesterday.  FINDINGS: Left jugular central venous catheter tips projected over the lower SVC at or near the cavoatrial junction. No evidence of pneumothorax or mediastinal hematoma. Left arm PICC tip remains projected over the lower SVC as well. Endotracheal tube tip in satisfactory position projecting approximately 3-4 cm above the carina.  Improved aeration in the right lower lobe since yesterday, as the right hemidiaphragm is now visible, though there are persistent airspace opacities at the right base. Stable dense consolidation in the left lower lobe with silhouetting of the left hemidiaphragm. No new pulmonary parenchymal abnormalities. Cardiac silhouette mildly enlarged but stable. Pulmonary vascularity normal without evidence of pulmonary edema.  IMPRESSION: 1. Left jugular central venous catheter tip projects over the lower SVC at or near the cavoatrial junction. 2. Remaining support apparatus satisfactory. 3. Improved aeration in the right lower lobe since yesterday, though moderate airspace opacities persist. Stable dense left lower lobe atelectasis and/or pneumonia. No new abnormalities.   Electronically Signed   By: Evangeline Dakin M.D.   On: 14-Nov-2013 11:59   Portable Chest Xray  11/06/2013   CLINICAL DATA:  Intubation.  EXAM: PORTABLE CHEST - 1 VIEW  COMPARISON:  11/06/2013.  FINDINGS: Endotracheal tube and PICC line in good anatomic position. Endotracheal tube tip is 2.9 cm above the carina. Cardiomegaly with bilateral interstitial prominence and pleural effusion noted consistent with congestive heart failure. No acute bony abnormality identified.  IMPRESSION: 1. Endotracheal tube and PICC line in good anatomic position. 2. Congestive heart failure with bilateral pulmonary edema and small pleural effusions.   Electronically Signed   By: [place]   On: 11/06/2013 15:38   Dg Chest Port 1 View  11/06/2013   CLINICAL DATA:  Short of breath.  Respiratory  failure.  EXAM: PORTABLE CHEST - 1 VIEW  COMPARISON:  11/03/2013.  FINDINGS: Support apparatus: LEFT upper extremity PICC appears unchanged. Monitoring leads project over the chest.  Cardiomediastinal Silhouette:  Unchanged.  Lungs: LEFT-greater-than-RIGHT basilar atelectasis. No airspace disease. No pneumothorax. Retrocardiac density likely representing a combination of atelectasis and pleural effusion.  Effusions: Small bilateral effusions. LEFT-greater-than-RIGHT effusion.  Other:  The Chin is draped over the chest.  IMPRESSION: 1. Interval removal of enteric tube. LEFT upper extremity PICC unchanged. 2. Small bilateral pleural effusions, LEFT-greater-than-RIGHT basilar atelectasis.   Electronically Signed   By: Dereck Ligas M.D.   On: 11/06/2013 14:13    Anti-infectives: Anti-infectives   Start     Dose/Rate Route Frequency Ordered Stop   11/08/13 1000  voriconazole (VFEND) 300 mg in sodium chloride 0.9 % 100 mL IVPB     300 mg 65 mL/hr over 120 Minutes Intravenous Every 12 hours 2013/11/14 0945     2013/11/14 1000  vancomycin (VANCOCIN) IVPB 1000 mg/200 mL premix     1,000 mg 200 mL/hr over 60 Minutes Intravenous Every 24 hours 11/14/13 0943     11/14/2013 1000  voriconazole (VFEND) 500 mg in sodium chloride 0.9 % 100 mL IVPB     500 mg 75 mL/hr over 120 Minutes Intravenous Every 12 hours November 14, 2013 0945 11/08/13 0959   10/31/13 2300  ertapenem (INVANZ) 1  g in sodium chloride 0.9 % 50 mL IVPB     1 g 100 mL/hr over 30 Minutes Intravenous Daily at bedtime 10/31/13 2159     10/31/13 2200  ertapenem [facility]) injection 1 g  Status:  Discontinued     1 g Intramuscular Every 24 hours 10/31/13 2155 10/31/13 2158   11/06/2013 2200  cefoTEtan (CEFOTAN) 2 g in dextrose 5 % 50 mL IVPB     2 g 100 mL/hr over 30 Minutes Intravenous Every 12 hours 10/18/2013 1639 10/27/2013 2203   11/04/2013 0545  clindamycin (CLEOCIN) 900 mg, gentamicin (GARAMYCIN) 240 mg in sodium chloride 0.9 % 1,000 mL for intraperitoneal  lavage  Status:  Discontinued    Comments:  Pharmacy may adjust dosing strength, schedule, rate of infusion, etc as needed to optimize therapy    Intraperitoneal To Surgery 10/21/2013 0536 10/22/2013 1625   11/14/2013 0536  cefoTEtan (CEFOTAN) 2 g in dextrose 5 % 50 mL IVPB     2 g 100 mL/hr over 30 Minutes Intravenous On call to O.R. 10/29/2013 0536 10/25/2013 1344      Assessment/Plan: s/p Procedure(s): MINIMALLY  INVASIVE ROBOT /LAPAROSCOPIC  REMOVAL OF PARTIAL COLON AND RECTUM LOW ANTERIOR RESECTION DIVERTING LOOP ILEOSTOMY RIGID PROTOSCOPY  (N/A) Left abd abscess drainage 7/23; cont current tx as per CCM/CCS; monitor drain outputs, labs, check final cx's; obtain f/u CT once output diminishes  LOS: 10 days    ALLRED,D [facility] 2013/11/27

## 2013-11-15 NOTE — Progress Notes (Signed)
eLink Physician-Brief Progress Note Patient Name: Richard [name] DOB: March 04, 1935 MRN: [id number]  Date of Service  12/04/13   HPI/Events of Note   Worsening shock on CVVHD Case discussed with my partner who cared for him earlier today> DNR, overall prognosis grim  eICU Interventions  Push bicarb amp now Start epinephrine gtt Family to room/bedside   Intervention Category Major Interventions: Shock - evaluation and management  Delissa Silba 04-Dec-2013, 7:09 PM

## 2013-11-15 NOTE — Consult Note (Signed)
[facility] Renal Consultation Note  Requesting MD: CCM/Sood Indication for Consultation: AKI- need for CRRT  HPI:  Richard [name] is a 79 y.o.white male with PMhx tobacco use and history of colon Ca admitted for removal of a rectal mass which was done on 7/14.  He initially did well until developing hypotension on 7/17 associated with AKI which did improve with creatinine 0.84 yesterday.  Over the last 24 hours  has developed MSOF, now intubated - had intraabdominal abscesses beng managed with percutaneous drain- In addition to above has developed a refractory metabolic acidosis so we have been consulted to assist with management and to provide CRRT- left IJ cathter is in for HD- wife at bedside "unfortunately I understand too well"   Creat  Date/Time Value Ref Range Status  06/19/2013  4:10 PM 0.70  0.50 - 1.35 mg/dL Final     Creatinine, Ser  Date/Time Value Ref Range Status  11/27/2013 11:42 AM 2.30* 0.50 - 1.35 mg/dL Final  11/27/2013  8:35 AM 2.20* 0.50 - 1.35 mg/dL Final  11/27/2013  5:30 AM 2.15* 0.50 - 1.35 mg/dL Final     DELTA CHECK NOTED     REPEATED TO VERIFY  11/06/2013  4:20 AM 0.84  0.50 - 1.35 mg/dL Final  11/05/2013  5:30 AM 0.98  0.50 - 1.35 mg/dL Final  11/04/2013  4:10 AM 1.21  0.50 - 1.35 mg/dL Final  11/03/2013  5:15 AM 1.59* 0.50 - 1.35 mg/dL Final     DELTA CHECK NOTED     REPEATED TO VERIFY  11/02/2013  5:10 AM 2.61* 0.50 - 1.35 mg/dL Final  11/01/2013  5:00 AM 2.63* 0.50 - 1.35 mg/dL Final  10/31/2013  6:36 PM 2.08* 0.50 - 1.35 mg/dL Final     DELTA CHECK NOTED     REPEATED TO VERIFY  10/31/2013  4:40 AM 0.88  0.50 - 1.35 mg/dL Final  10/29/2013  5:15 AM 0.90  0.50 - 1.35 mg/dL Final  10/24/2013  5:17 PM 0.87  0.50 - 1.35 mg/dL Final  10/22/2013 10:30 AM 0.82  0.50 - 1.35 mg/dL Final  06/07/2011  8:29 PM 1.00  0.50 - 1.35 mg/dL Final  03/05/2007 11:05 AM 0.87   Final     PMHx:   Past Medical History  Diagnosis Date  . Rectal adenocarcinoma (7cm  posterior) 06/16/2013  . Kyphoscoliosis deformity of spine   . History of radiation therapy 07/14/13-08/20/13    rectal, 50.4Gy/  . Psoriasis     SCALP  . Hemorrhoids   . History of transfusion   . Anemia of chronic disease 11/01/2013    Past Surgical History  Procedure Laterality Date  . I&d extremity  06/07/2011    Procedure: IRRIGATION AND DEBRIDEMENT EXTREMITY;  Surgeon: Linna Hoff, MD;  Location: [facility];  Service: Orthopedics;  Laterality: Left;  . Amputation  06/07/2011    Procedure: AMPUTATION DIGIT;  Surgeon: Linna Hoff, MD;  Location: [facility];  Service: Orthopedics;  Laterality: Left;  Revision multiple fingers index and middle   . Eus N/A 06/25/2013    Procedure: LOWER ENDOSCOPIC ULTRASOUND (EUS);  Surgeon: Arta Silence, MD;  Location: [facility];  Service: Endoscopy;  Laterality: N/A;  . Colonoscopy w/ biopsies  05/29/13  . Eus  06/25/13    Family Hx:  Family History  Problem Relation Age of Onset  . Lung cancer Father   . Kidney cancer Brother   . Cancer - Other Mother     "upper stomach"  .  Lung cancer Sister     Social History:  reports that he has been smoking Cigarettes.  He has a 64 pack-year smoking history. He does not have any smokeless tobacco history on file. He reports that he drinks about 3 ounces of alcohol per week. He reports that he does not use illicit drugs.  Allergies: No Known Allergies  Medications: Prior to Admission medications   Medication Sig Start Date End Date Taking? Authorizing Provider  Multiple Vitamin (MULTIVITAMIN) tablet Take 1 tablet by mouth daily.   Yes Historical Provider, MD  oxyCODONE (OXY IR/ROXICODONE) 5 MG immediate release tablet Take 1-2 tablets (5-10 mg total) by mouth every 4 (four) hours as needed for moderate pain, severe pain or breakthrough pain. 11/02/2013   Adin Hector, MD    I have reviewed the patient's current medications.  Labs:  Results for orders placed during the hospital encounter of 10/21/2013 (from  the past 48 hour(s))  GLUCOSE, CAPILLARY     Status: None   Collection Time    11/05/13  3:45 PM      Result Value Ref Range   Glucose-Capillary 83  70 - 99 mg/dL  BASIC METABOLIC PANEL     Status: Abnormal   Collection Time    11/06/13  4:20 AM      Result Value Ref Range   Sodium 146  137 - 147 mEq/L   Potassium 3.9  3.7 - 5.3 mEq/L   Chloride 115 (*) 96 - 112 mEq/L   CO2 22  19 - 32 mEq/L   Glucose, Bld 260 (*) 70 - 99 mg/dL   BUN 32 (*) 6 - 23 mg/dL   Creatinine, Ser 0.84  0.50 - 1.35 mg/dL   Calcium 7.4 (*) 8.4 - 10.5 mg/dL   GFR calc non Af Amer 83 (*) >90 mL/min   GFR calc Af Amer >90  >90 mL/min   Comment: (NOTE)     The eGFR has been calculated using the CKD EPI equation.     This calculation has not been validated in all clinical situations.     eGFR's persistently <90 mL/min signify possible Chronic Kidney     Disease.   Anion gap 9  5 - 15  CBC     Status: Abnormal   Collection Time    11/06/13  4:20 AM      Result Value Ref Range   WBC 12.9 (*) 4.0 - 10.5 K/uL   RBC 2.53 (*) 4.22 - 5.81 MIL/uL   Hemoglobin 7.4 (*) 13.0 - 17.0 g/dL   HCT 22.7 (*) 39.0 - 52.0 %   MCV 89.7  78.0 - 100.0 fL   MCH 29.2  26.0 - 34.0 pg   MCHC 32.6  30.0 - 36.0 g/dL   RDW 17.8 (*) 11.5 - 15.5 %   Platelets 99 (*) 150 - 400 K/uL   Comment: SPECIMEN CHECKED FOR CLOTS     REPEATED TO VERIFY     PLATELET COUNT CONFIRMED BY SMEAR  BLOOD GAS, ARTERIAL     Status: Abnormal   Collection Time    11/06/13  1:45 PM      Result Value Ref Range   FIO2 1.00     Delivery systems NON-REBREATHER OXYGEN MASK     pH, Arterial 7.277 (*) 7.350 - 7.450   pCO2 arterial 43.3  35.0 - 45.0 mmHg   pO2, Arterial 232.0 (*) 80.0 - 100.0 mmHg   Bicarbonate 19.6 (*) 20.0 - 24.0 mEq/L  TCO2 18.8  0 - 100 mmol/L   Acid-base deficit 6.3 (*) 0.0 - 2.0 mmol/L   O2 Saturation 99.2     Patient temperature 98.6     Collection site FEMORAL ARTERY     Drawn by COLLECTED BY LABORATORY     Comment: MICRODASE      MD   Sample type ARTERIAL DRAW    LACTIC ACID, PLASMA     Status: None   Collection Time    11/06/13  1:57 PM      Result Value Ref Range   Lactic Acid, Venous 1.9  0.5 - 2.2 mmol/L  PRO B NATRIURETIC PEPTIDE     Status: Abnormal   Collection Time    11/06/13  3:30 PM      Result Value Ref Range   Pro B Natriuretic peptide (BNP) 2016.0 (*) 0 - 450 pg/mL  CBC     Status: Abnormal   Collection Time    11/06/13  3:30 PM      Result Value Ref Range   WBC 11.2 (*) 4.0 - 10.5 K/uL   Comment: REPEATED TO VERIFY     WHITE COUNT CONFIRMED ON SMEAR   RBC 3.15 (*) 4.22 - 5.81 MIL/uL   Hemoglobin 9.0 (*) 13.0 - 17.0 g/dL   Comment: DELTA CHECK NOTED     REPEATED TO VERIFY     SPECIMEN CHECKED FOR CLOTS     RESULT CHECKED     SALINE GIVEN   HCT 27.9 (*) 39.0 - 52.0 %   MCV 88.6  78.0 - 100.0 fL   MCH 28.6  26.0 - 34.0 pg   MCHC 32.3  30.0 - 36.0 g/dL   RDW 17.9 (*) 11.5 - 15.5 %   Platelets 108 (*) 150 - 400 K/uL   Comment: REPEATED TO VERIFY     SPECIMEN CHECKED FOR CLOTS     PLATELET COUNT CONFIRMED BY SMEAR     PLATELET CLUMPS NOTED ON SMEAR  TYPE AND SCREEN     Status: None   Collection Time    11/06/13  3:30 PM      Result Value Ref Range   ABO/RH(D) A NEG     Antibody Screen NEG     Sample Expiration 11/09/2013     Unit Number [id number]     Blood Component Type RED CELLS,LR     Unit division 00     Status of Unit ISSUED,FINAL     Transfusion Status OK TO TRANSFUSE     Crossmatch Result Compatible    PREPARE RBC (CROSSMATCH)     Status: None   Collection Time    11/06/13  3:30 PM      Result Value Ref Range   Order Confirmation ORDER PROCESSED BY BLOOD BANK    BLOOD GAS, ARTERIAL     Status: Abnormal   Collection Time    11/06/13  3:43 PM      Result Value Ref Range   FIO2 0.50     Delivery systems VENTILATOR     Mode PRESSURE REGULATED VOLUME CONTROL     VT 600     Rate 15     Peep/cpap 5.0     pH, Arterial 7.295 (*) 7.350 - 7.450   pCO2 arterial 38.2   35.0 - 45.0 mmHg   pO2, Arterial 128.0 (*) 80.0 - 100.0 mmHg   Bicarbonate 18.0 (*) 20.0 - 24.0 mEq/L   TCO2 17.2  0 - 100 mmol/L   Acid-base  deficit 7.4 (*) 0.0 - 2.0 mmol/L   O2 Saturation 98.9     Patient temperature 98.6     Collection site A-LINE     Drawn by [phone]     Sample type ARTERIAL DRAW     Allens test (pass/fail) PASS  PASS  CULTURE, ROUTINE-ABSCESS     Status: None   Collection Time    11/06/13 11:29 PM      Result Value Ref Range   Specimen Description PERITONEAL CAVITY     Special Requests NONE     Gram Stain       Value: ABUNDANT WBC PRESENT,BOTH PMN AND MONONUCLEAR     NO SQUAMOUS EPITHELIAL CELLS SEEN     ABUNDANT GRAM NEGATIVE RODS     FEW GRAM POSITIVE RODS     Performed at [facility]   Culture PENDING     Report Status PENDING    BLOOD GAS, ARTERIAL     Status: Abnormal   Collection Time    December 05, 2013  5:30 AM      Result Value Ref Range   FIO2 40.00     Delivery systems VENTILATOR     Mode PRESSURE REGULATED VOLUME CONTROL     VT 600     Rate 15     Peep/cpap 5.0     pH, Arterial 6.974 (*) 7.350 - 7.450   Comment: CRITICAL RESULT CALLED TO, READ BACK BY AND VERIFIED WITH:     Avon Gully, RN AT [place], RRT, RCP ON 2013/12/05   pCO2 arterial 28.3 (*) 35.0 - 45.0 mmHg   pO2, Arterial 135.0 (*) 80.0 - 100.0 mmHg   Bicarbonate 6.2 (*) 20.0 - 24.0 mEq/L   TCO2 6.5  0 - 100 mmol/L   Acid-base deficit 24.4 (*) 0.0 - 2.0 mmol/L   O2 Saturation 96.2     Patient temperature 98.6     Collection site ARTERIAL LINE     Drawn by [phone]     Sample type ARTERIAL     Allens test (pass/fail) PASS  PASS  BASIC METABOLIC PANEL     Status: Abnormal   Collection Time    2013/12/05  5:30 AM      Result Value Ref Range   Sodium 141  137 - 147 mEq/L   Comment: REPEATED TO VERIFY   Potassium 5.6 (*) 3.7 - 5.3 mEq/L   Comment: DELTA CHECK NOTED     REPEATED TO VERIFY     NO VISIBLE HEMOLYSIS   Chloride 114 (*) 96 - 112 mEq/L   Comment:  REPEATED TO VERIFY   CO2 7 (*) 19 - 32 mEq/L   Comment: REPEATED TO VERIFY     CRITICAL RESULT CALLED TO, READ BACK BY AND VERIFIED WITH:     KOONTZ,A/2W _0  ON December 05, 2013 BY KARCZEWSKI,S.   Glucose, Bld 81  70 - 99 mg/dL   BUN 37 (*) 6 - 23 mg/dL   Creatinine, Ser 2.15 (*) 0.50 - 1.35 mg/dL   Comment: DELTA CHECK NOTED     REPEATED TO VERIFY   Calcium 6.3 (*) 8.4 - 10.5 mg/dL   Comment: CRITICAL RESULT CALLED TO, READ BACK BY AND VERIFIED WITH:     KOONTZ,A/2W _1  ON 12-05-2013 BY KARCZEWSKI,S.   GFR calc non Af Amer 28 (*) >90 mL/min   GFR calc Af Amer 33 (*) >90 mL/min   Comment: (NOTE)     The eGFR has been calculated using the CKD EPI equation.  This calculation has not been validated in all clinical situations.     eGFR's persistently <90 mL/min signify possible Chronic Kidney     Disease.   Anion gap 20 (*) 5 - 15   Comment: RESULT CHECKED  CBC     Status: Abnormal   Collection Time    11/23/13  5:30 AM      Result Value Ref Range   WBC 35.5 (*) 4.0 - 10.5 K/uL   Comment: WHITE COUNT CONFIRMED ON SMEAR   RBC 3.68 (*) 4.22 - 5.81 MIL/uL   Hemoglobin 10.6 (*) 13.0 - 17.0 g/dL   HCT 34.6 (*) 39.0 - 52.0 %   MCV 94.0  78.0 - 100.0 fL   MCH 28.8  26.0 - 34.0 pg   MCHC 30.6  30.0 - 36.0 g/dL   RDW 18.3 (*) 11.5 - 15.5 %   Platelets 99 (*) 150 - 400 K/uL   Comment: SPECIMEN CHECKED FOR CLOTS     REPEATED TO VERIFY     PLATELET COUNT CONFIRMED BY SMEAR  LACTIC ACID, PLASMA     Status: Abnormal   Collection Time    2013/11/23  5:50 AM      Result Value Ref Range   Lactic Acid, Venous 9.9 (*) 0.5 - 2.2 mmol/L  BASIC METABOLIC PANEL     Status: Abnormal   Collection Time    11/23/2013  8:35 AM      Result Value Ref Range   Sodium 139  137 - 147 mEq/L   Comment: REPEATED TO VERIFY   Potassium 5.6 (*) 3.7 - 5.3 mEq/L   Comment: NO VISIBLE HEMOLYSIS   Chloride 108  96 - 112 mEq/L   Comment: REPEATED TO VERIFY   CO2 7 (*) 19 - 32 mEq/L   Comment: REPEATED TO VERIFY      CRITICAL RESULT CALLED TO, READ BACK BY AND VERIFIED WITH:     MAIN,S. RN AT 8127 2013-11-23 [place],T   Glucose, Bld 72  70 - 99 mg/dL   BUN 36 (*) 6 - 23 mg/dL   Creatinine, Ser 2.20 (*) 0.50 - 1.35 mg/dL   Calcium 6.0 (*) 8.4 - 10.5 mg/dL   Comment: CRITICAL RESULT CALLED TO, READ BACK BY AND VERIFIED WITH:     MAIN,S. RN AT 5170 23-Nov-2013 [place],T   GFR calc non Af Amer 27 (*) >90 mL/min   GFR calc Af Amer 32 (*) >90 mL/min   Comment: (NOTE)     The eGFR has been calculated using the CKD EPI equation.     This calculation has not been validated in all clinical situations.     eGFR's persistently <90 mL/min signify possible Chronic Kidney     Disease.   Anion gap 24 (*) 5 - 15   Comment: REPEATED TO VERIFY  BLOOD GAS, ARTERIAL     Status: Abnormal   Collection Time    2013-11-23  8:47 AM      Result Value Ref Range   FIO2 0.40     Delivery systems VENTILATOR     Mode PRESSURE REGULATED VOLUME CONTROL     VT 600     Rate 30     Peep/cpap 5.0     pH, Arterial 7.084 (*) 7.350 - 7.450   Comment: CRITICAL RESULT CALLED TO, READ BACK BY AND VERIFIED WITH:      PETE BABCOCK,NP AT 0850 BY LISA CRADDOCK RRT,RCP ON 11/23/2013   pCO2 arterial 23.9 (*) 35.0 - 45.0 mmHg  pO2, Arterial 136.0 (*) 80.0 - 100.0 mmHg   Bicarbonate 6.8 (*) 20.0 - 24.0 mEq/L   TCO2 6.9  0 - 100 mmol/L   Acid-base deficit 21.8 (*) 0.0 - 2.0 mmol/L   O2 Saturation 97.4     Patient temperature 98.6     Collection site A-LINE     Drawn by [phone]     Sample type ARTERIAL DRAW     Allens test (pass/fail) PASS  PASS  BLOOD GAS, ARTERIAL     Status: Abnormal   Collection Time    11/17/2013 11:34 AM      Result Value Ref Range   FIO2 0.40     Delivery systems VENTILATOR     Mode PRESSURE REGULATED VOLUME CONTROL     VT 600     Rate 30     Peep/cpap 5.0     pH, Arterial 7.080 (*) 7.350 - 7.450   Comment: CRITICAL RESULT CALLED TO, READ BACK BY AND VERIFIED WITH:      SHEILA MAIN RN AT 1136 BY LISA CRADDOCK  RRT,RCP ON 11/17/13   pCO2 arterial 17.6 (*) 35.0 - 45.0 mmHg   Comment: CRITICAL RESULT CALLED TO, READ BACK BY AND VERIFIED WITH:      SHEILA MAIN RN AT 1025 BY LISA CRADDOCK RRT,RCP ON 2013-11-17    pO2, Arterial 204.0 (*) 80.0 - 100.0 mmHg   Bicarbonate 5.0 (*) 20.0 - 24.0 mEq/L   TCO2 5.1  0 - 100 mmol/L   Acid-base deficit 23.7 (*) 0.0 - 2.0 mmol/L   O2 Saturation 98.6     Patient temperature 98.6     Collection site A-LINE     Drawn by [phone]     Sample type ARTERIAL DRAW     Allens test (pass/fail) PASS  PASS  BASIC METABOLIC PANEL     Status: Abnormal   Collection Time    November 17, 2013 11:42 AM      Result Value Ref Range   Sodium 140  137 - 147 mEq/L   Potassium 6.7 (*) 3.7 - 5.3 mEq/L   Comment: CRITICAL RESULT CALLED TO, READ BACK BY AND VERIFIED WITH:     G. MEULLER RN AT 1310 ON 2013-11-17 BY SHUEA   Chloride 110  96 - 112 mEq/L   CO2 <7 (*) 19 - 32 mEq/L   Comment: CRITICAL RESULT CALLED TO, READ BACK BY AND VERIFIED WITH:     G. MEULLER RN AT 1310 ON 17-Nov-2013 BY SHUEA   Glucose, Bld 98  70 - 99 mg/dL   BUN 38 (*) 6 - 23 mg/dL   Creatinine, Ser 2.30 (*) 0.50 - 1.35 mg/dL   Calcium 6.1 (*) 8.4 - 10.5 mg/dL   Comment: CRITICAL RESULT CALLED TO, READ BACK BY AND VERIFIED WITH:     G. MUELLER RN AT 1310 ON Nov 17, 2013 BY SHUEA   GFR calc non Af Amer 26 (*) >90 mL/min   GFR calc Af Amer 30 (*) >90 mL/min   Comment: (NOTE)     The eGFR has been calculated using the CKD EPI equation.     This calculation has not been validated in all clinical situations.     eGFR's persistently <90 mL/min signify possible Chronic Kidney     Disease.   Anion gap NOT CALCULATED  5 - 15     ROS:  Review of systems not obtained due to patient factors.  Physical Exam: Filed Vitals:   11-17-13 1200  BP:   Pulse:  30  Temp: 95.4 F (35.2 C)  Resp: 30     General: intubated WM, on vent- pale and mottled HEENT: PERRLA Neck: there is JVD Heart: tachy Lungs: poor air movement Abdomen: no  bowel sounds- drain in place as well as colostomy Extremities: pitting edema and mottled Skin: cool and dry Neuro: sedated on vent Left IJ vascath placed 7/24  Assessment/Plan: 79 year old smoker s/p surgery to remove rectal mass now complicated by MSOF- AKI with refractory metabolic acidosis 1.Renal- AKI in the setting of MSOF after rectal surgery and likely sepsis from intraabdominal abscess.  Oliguric with lactic acidosis and hyperkalemia.  The thought is to attempt to get him stable enough so that he could go to the OR for a washout.  The only way to stabilize would be to do CRRT to correct his metabolic acidosis.  Will plan for CRRT- keep even- no heparin- all bicarb replacement 2. Hypertension/volume  - hypotensive, likely septic- on invanz and vanc and vfend also on pressors per CCM 3. GI/surg  - intraabdominal abscesses- with drains and abx as above- ultimate plan if stabilizes is to take back to OR for washout 4. Anemia- surgery and AKI- supportive care and transfuse as needed 5. Hyperkalemia- due to met acidosis secondary to renal failure- lactate of 10  6. Metabolic acidosis- due to lactic acidosis- will try to placate with CRRT 7. Dispo- prognosis poor- I think wife understands- aggressive care short term for now to see if can stabilize to the point can go to OR  Thank you for consult, will follow with you   , A Dec 02, 2013, 1:32 PM

## 2013-11-15 NOTE — Progress Notes (Signed)
PULMONARY  / [place]   Name: Richard [name] MRN: [id number] DOB: 06/02/1934    ADMISSION DATE:  10/22/2013 CONSULTATION DATE: 10/31/13  REQUESTING CLINICIAN: Marcello Moores  CHIEF COMPLAINT/ REASON FOR CONSULT:  Hypotension, Tachypnea.    BRIEF PATIENT DESCRIPTION:  79 yo male smoker with hx of rectal cancer s/p admitted for removal of rectal mass on 7/14.  Developed hypotension, tachypnea 7/17 and PCCM consulted to assist with management.  He has hx of ETOH abuse.  SIGNIFICANT EVENTS: 7/14: Robotic low anterior resection with diverting loop ileostomy, bladder repair 7/17: Transfer to ICU with hypotension, tachypnea 7/19: Bladder pressure 24 7/21: Speech therapy >> thin liquids and advance to D3 when dentures in 7/23: Transfer to telemetry >> developed mental status change/hypotension >> transfused 1 unit PRBC, back to ICU >> VDRF, septic shock >> CT abd showed large abdominal abscesses >> IR placed drain in one accessible abscess LLQ 7/24: Septic shock/MODS/AKI/Acidosis/Hyperkalemia.  Family discussion >> DNR, but continue medical care  STUDIES: 7/17: Abd xray >> pneumoperitoneum 7/23: CT chest: no PE, large/mod layering bilateral effusions/atx 7/23: CT abd/pelvis: 20 cm abscess LLQ, 10.6 cm abscess RLQ, areas of free air  SUBJECTIVE: In refractory shock/MODS  VITAL SIGNS: Temp:  [97 F (36.1 C)-100.2 F (37.9 C)] 97 F (36.1 C) (07/24 0745) Pulse Rate:  [28-115] 28 (07/24 0715) Resp:  [15-30] 30 (07/24 0745) BP: (55-192)/(32-175) 80/53 mmHg (07/23 1525) SpO2:  [80 %-100 %] 100 % (07/24 0724) Arterial Line BP: (72-141)/(33-81) 107/57 mmHg (07/24 0745) FiO2 (%):  [40 %-100 %] 40 % (07/24 0728) Weight:  [84.9 kg (187 lb 2.7 oz)] 84.9 kg (187 lb 2.7 oz) (07/24 0400) HEMODYNAMICS: CVP:  [4 mmHg-10 mmHg] 8 mmHg VENTILATOR SETTINGS: Vent Mode:  [-] PRVC FiO2 (%):  [40 %-100 %] 40 % Set Rate:  [15 bmp-30 bmp] 30 bmp Vt Set:  [600 mL] 600 mL PEEP:  [5  cmH20] 5 cmH20 Plateau Pressure:  [18 cmH20-25 cmH20] 25 cmH20 INTAKE / OUTPUT: Intake/Output     07/23 0701 - 07/24 0700 07/24 0701 - 07/25 0700   I.V. (mL/kg) 3865.6 (45.5)    Blood 347.5    IV Piggyback 5050    Total Intake(mL/kg) 9263.1 (109.1)    Urine (mL/kg/hr) 100 (0)    Drains 1285 (0.6)    Stool 150 (0.1)    Total Output 1535     Net +7728.1           PHYSICAL EXAMINATION: General:sedated in refractory shock Neuro: sedated localizes HEENT: orally intubated  Cardiovascular: regular, no murmur Lungs: scattered rhonchi, basilar rales Abdomen:abd tenderness to gentle palp,  colostomy bag and drains in place, new drain w/ yellow purulent drainage  Musculoskeletal: LE edema  Skin: no rashes  CBC Recent Labs     11/06/13  0420  11/06/13  1530  Nov 15, 2013  0530  WBC  12.9*  11.2*  35.5*  HGB  7.4*  9.0*  10.6*  HCT  22.7*  27.9*  34.6*  PLT  99*  108*  99*    Coag's No results found for this basename: APTT, INR,  in the last 72 hours  BMET Recent Labs     11/06/13  0420  11/15/13  0530  Nov 15, 2013  0835  NA  146  141  139  K  3.9  5.6*  5.6*  CL  115*  114*  108  CO2  22  7*  7*  BUN  32*  37*  36*  CREATININE  0.84  2.15*  2.20*  GLUCOSE  260*  81  72    Electrolytes Recent Labs     11/06/13  0420  December 05, 2013  0530  12-05-13  0835  CALCIUM  7.4*  6.3*  6.0*    Sepsis Markers No results found for this basename: LACTICACIDVEN, PROCALCITON, O2SATVEN,  in the last 72 hours  ABG Recent Labs     11/06/13  1543  05-Dec-2013  0530  Dec 05, 2013  0847  PHART  7.295*  6.974*  7.084*  PCO2ART  38.2  28.3*  23.9*  PO2ART  128.0*  135.0*  136.0*    Liver Enzymes No results found for this basename: AST, ALT, ALKPHOS, BILITOT, ALBUMIN,  in the last 72 hours  Cardiac Enzymes Recent Labs     11/06/13  1530  PROBNP  2016.0*    Glucose Recent Labs     11/04/13  1902  11/04/13  2358  11/05/13  0529  11/05/13  0801  11/05/13  1225  11/05/13  1545   GLUCAP  121*  101*  107*  96  74  83    Imaging Ct Abdomen Pelvis Wo Contrast  11/06/2013   CLINICAL DATA:  Suspected abdominal sepsis. Status post robotic laparoscopic partial colectomy and rectum with LAR and diverting loop ileostomy.  EXAM: CT CHEST WITH CONTRAST.  CT ABDOMEN, AND PELVIS WITHOUT CONTRAST  TECHNIQUE: Multidetector CT imaging of the chest, abdomen and pelvis was performed following the standard protocol during bolus administration of intravenous contrast.  CONTRAST:  167mL OMNIPAQUE IOHEXOL 350 MG/ML SOLN  COMPARISON:  None.  Prior CT from 06/20/2013  FINDINGS: CT CHEST FINDINGS  Endotracheal tube in place with tip just above the carina. No pathologically enlarged mediastinal, hilar, or axillary lymph nodes are identified.  Left-sided central venous catheter partially visualized. Intrathoracic aorta is of normal caliber and appearance. Great vessels within normal limits.  Heart size within normal limits.  No pericardial effusion.  Pulmonary arterial tree is well opacified. No filling defect to suggest acute pulmonary embolism identified. Re-formatted imaging confirms these findings.  Moderate a large layering bilateral pleural effusions are present with associated atelectasis. Aerated portions of the lungs are clear without focal infiltrate. No pneumothorax. Centrilobular emphysematous changes noted within the bilateral upper lobes.  No acute osseous abnormality. No worrisome lytic or blastic osseous lesions.  CT ABDOMEN AND PELVIS FINDINGS  Limited noncontrast evaluation of the liver is unremarkable. Gallbladder within normal limits. No biliary ductal dilatation. Spleen within normal limits. Adrenal glands and pancreas demonstrate a normal unenhanced appearance.  Kidneys are equal in size. No nephrolithiasis or hydronephrosis. No hydroureter bilaterally.  Stomach within normal limits. Patient is status post partial colectomy with a right lower quadrant ileostomy seen. Percutaneous surgical  drain extending via the right anterior abdomen in place with tip terminating in the lower pelvis.  A large loculated hypodense collection measuring 20.0 bite 18.4 x 10.0 cm seen within the left abdomen (series 2, image 49). Scattered foci of gas seen within this collection. Finding is most consistent with intra-abdominal abscess.  Additional loculated collection measuring 6.8 x 5.0 x 10.6 cm seen within the right lower quadrant (series 2, image 71). The percutaneous drain does not traverse either of these collections. Moderate volume free fluid present within the abdomen and pelvis. Few scattered foci of free intraperitoneal air are seen, which may be related to recent surgery.  Bladder is largely decompressed with a Foley catheter in place. Prostate grossly normal.  Diffuse anasarca present.  No acute osseous abnormality. No new worrisome lytic or blastic osseous lesions. Sclerotic focus within the L2 vertebral body is unchanged. Lucency within the L4 vertebral body also stable.  IMPRESSION: CT CHEST:  1. No CT evidence of acute pulmonary embolism. 2. Moderate to large layering bilateral pleural effusions with associated atelectasis. 3. Upper lobe predominant centrilobular emphysema. 4. Anasarca.  CT ABDOMEN AND PELVIS:  1. Large loculated collection with internal gas measuring 20.0 x 18.4 x 10.0 cm within the left abdomen, most consistent with abscess. 2. Second loculated collection with internal gas measuring 6.8 x 5.0 x 10.6 cm within the right lower quadrant, also most consistent with abscess. 3. Scattered foci of free intraperitoneal air. While this finding may be partly related to recent surgery, given the presence of the large abscess sees, possible bowel leak should be considered. 4. Sequelae of prior partial colectomy with low anterior resection and diverting right lower quadrant ileostomy. No evidence of obstruction. 5. Moderate volume ascites. 6. Percutaneous surgical drain in place. This does not  traverse either of the loculated collections. 7. Diffuse anasarca. Critical Value/emergent results were called by telephone at the time of interpretation on 11/06/2013 at 7:53 pm to Dr. Kathalene Frames who verbally acknowledged these results.   Electronically Signed   By: Jeannine Boga M.D.   On: 11/06/2013 20:13   Ct Angio Chest Pe W/cm &/or Wo Cm  11/06/2013   CLINICAL DATA:  Suspected abdominal sepsis. Status post robotic laparoscopic partial colectomy and rectum with LAR and diverting loop ileostomy.  EXAM: CT CHEST WITH CONTRAST.  CT ABDOMEN, AND PELVIS WITHOUT CONTRAST  TECHNIQUE: Multidetector CT imaging of the chest, abdomen and pelvis was performed following the standard protocol during bolus administration of intravenous contrast.  CONTRAST:  116mL OMNIPAQUE IOHEXOL 350 MG/ML SOLN  COMPARISON:  None.  Prior CT from 06/20/2013  FINDINGS: CT CHEST FINDINGS  Endotracheal tube in place with tip just above the carina. No pathologically enlarged mediastinal, hilar, or axillary lymph nodes are identified.  Left-sided central venous catheter partially visualized. Intrathoracic aorta is of normal caliber and appearance. Great vessels within normal limits.  Heart size within normal limits.  No pericardial effusion.  Pulmonary arterial tree is well opacified. No filling defect to suggest acute pulmonary embolism identified. Re-formatted imaging confirms these findings.  Moderate a large layering bilateral pleural effusions are present with associated atelectasis. Aerated portions of the lungs are clear without focal infiltrate. No pneumothorax. Centrilobular emphysematous changes noted within the bilateral upper lobes.  No acute osseous abnormality. No worrisome lytic or blastic osseous lesions.  CT ABDOMEN AND PELVIS FINDINGS  Limited noncontrast evaluation of the liver is unremarkable. Gallbladder within normal limits. No biliary ductal dilatation. Spleen within normal limits. Adrenal glands and pancreas  demonstrate a normal unenhanced appearance.  Kidneys are equal in size. No nephrolithiasis or hydronephrosis. No hydroureter bilaterally.  Stomach within normal limits. Patient is status post partial colectomy with a right lower quadrant ileostomy seen. Percutaneous surgical drain extending via the right anterior abdomen in place with tip terminating in the lower pelvis.  A large loculated hypodense collection measuring 20.0 bite 18.4 x 10.0 cm seen within the left abdomen (series 2, image 49). Scattered foci of gas seen within this collection. Finding is most consistent with intra-abdominal abscess.  Additional loculated collection measuring 6.8 x 5.0 x 10.6 cm seen within the right lower quadrant (series 2, image 71). The percutaneous drain does not traverse either of these collections. Moderate volume free fluid present  within the abdomen and pelvis. Few scattered foci of free intraperitoneal air are seen, which may be related to recent surgery.  Bladder is largely decompressed with a Foley catheter in place. Prostate grossly normal.  Diffuse anasarca present.  No acute osseous abnormality. No new worrisome lytic or blastic osseous lesions. Sclerotic focus within the L2 vertebral body is unchanged. Lucency within the L4 vertebral body also stable.  IMPRESSION: CT CHEST:  1. No CT evidence of acute pulmonary embolism. 2. Moderate to large layering bilateral pleural effusions with associated atelectasis. 3. Upper lobe predominant centrilobular emphysema. 4. Anasarca.  CT ABDOMEN AND PELVIS:  1. Large loculated collection with internal gas measuring 20.0 x 18.4 x 10.0 cm within the left abdomen, most consistent with abscess. 2. Second loculated collection with internal gas measuring 6.8 x 5.0 x 10.6 cm within the right lower quadrant, also most consistent with abscess. 3. Scattered foci of free intraperitoneal air. While this finding may be partly related to recent surgery, given the presence of the large abscess  sees, possible bowel leak should be considered. 4. Sequelae of prior partial colectomy with low anterior resection and diverting right lower quadrant ileostomy. No evidence of obstruction. 5. Moderate volume ascites. 6. Percutaneous surgical drain in place. This does not traverse either of the loculated collections. 7. Diffuse anasarca. Critical Value/emergent results were called by telephone at the time of interpretation on 11/06/2013 at 7:53 pm to Dr. Kathalene Frames who verbally acknowledged these results.   Electronically Signed   By: Jeannine Boga M.D.   On: 11/06/2013 20:13   [place] Abscess Drain  12/06/2013   CLINICAL DATA:  79 year old with postoperative abdominal fluid collections and concern for abscess collections.  EXAM: ULTRASOUND GUIDED PLACEMENT OF AN ABDOMINAL ABSCESS DRAIN  Physician: Stephan Minister. Anselm Pancoast, MD  FLUOROSCOPY TIME:  None  MEDICATIONS: None  ANESTHESIA/SEDATION: Patient was monitored by the intensive care unit nursing staff throughout the procedure.  PROCEDURE: Informed consent was obtained for an image guided drainage by the patient's wife. Ultrasound demonstrated a large complex fluid collection in the left lower abdomen. The left lower abdomen was prepped with Betadine and a sterile field was created. Skin was anesthetized 1% lidocaine. A Yueh catheter was directed to this fluid collection and thick yellow purulent fluid was aspirated. A stiff Amplatz wire was placed. Initially, a 10.2 [place] multipurpose drain was placed. However, only small amount of fluid could be removed because the fluid was so thick. This drain was removed over the Amplatz wire and exchanged for a 14 [place] multipurpose drain. Greater than 1 liter of yellowish brown thick purulent fluid was removed. Majority of the fluid collection was aspirated. Catheter was sutured to the skin and attached to a suction bulb. Fluid sample sent for culture.  FINDINGS: Complex fluid collection in the left lower abdomen with multiple  echoes. Findings consistent with an abscess collection. Markedly decreased fluid in the left lower abdomen following aspiration of the purulent fluid. Large amount of fluid around the liver with a few septations.  COMPLICATIONS: None  IMPRESSION: Ultrasound-guided drain placement in the left lower abdominal abscess. Greater than 1 liter of purulent fluid was removed.   Electronically Signed   By: Markus Daft M.D.   On: 12/06/13 08:18   Portable Chest Xray  11/06/2013   CLINICAL DATA:  Intubation.  EXAM: PORTABLE CHEST - 1 VIEW  COMPARISON:  11/06/2013.  FINDINGS: Endotracheal tube and PICC line in good anatomic position. Endotracheal tube tip is 2.9 cm above  the carina. Cardiomegaly with bilateral interstitial prominence and pleural effusion noted consistent with congestive heart failure. No acute bony abnormality identified.  IMPRESSION: 1. Endotracheal tube and PICC line in good anatomic position. 2. Congestive heart failure with bilateral pulmonary edema and small pleural effusions.   Electronically Signed   By: [place]   On: 11/06/2013 15:38   Dg Chest Port 1 View  11/06/2013   CLINICAL DATA:  Short of breath.  Respiratory failure.  EXAM: PORTABLE CHEST - 1 VIEW  COMPARISON:  11/03/2013.  FINDINGS: Support apparatus: LEFT upper extremity PICC appears unchanged. Monitoring leads project over the chest.  Cardiomediastinal Silhouette:  Unchanged.  Lungs: LEFT-greater-than-RIGHT basilar atelectasis. No airspace disease. No pneumothorax. Retrocardiac density likely representing a combination of atelectasis and pleural effusion.  Effusions: Small bilateral effusions. LEFT-greater-than-RIGHT effusion.  Other:  The Chin is draped over the chest.  IMPRESSION: 1. Interval removal of enteric tube. LEFT upper extremity PICC unchanged. 2. Small bilateral pleural effusions, LEFT-greater-than-RIGHT basilar atelectasis.   Electronically Signed   By: Dereck Ligas M.D.   On: 11/06/2013 14:13        ASSESSMENT / PLAN: OETT 7/23>>> PULMONARY A: Acute hypoxic respiratory failure 2nd to presumed AECOPD and sepsis shock >> required intubation 7/23. Tobacco abuse. P:   Full vent support PAD protocol  F/u CXR and abg Continue albut/atrov and brovana  CARDIOVASCULAR  Lt PICC 7/15 >> A: Recurrent septic shock/MODS in setting of intra-abd abscess formation 7/23. P:   Monitor hemodynamics Correct acid base imbalance CVP goal 8-12 MAP goal > 65 Pressors/volume as indicated  RENAL A:  Hypernatremia - improving. Acute kidney injury (baseline creatinine 0.88 from 10/31/13), w/ re-occurrence in setting of shock +/- dye load 7/23. Oliguria. Profound metabolic acidosis (+AG). Hyperchloremia. Hyperkalemia. P:   Continue bicarb gtt Avoid hypovolemia &  hypotension Monitor renal fx, urine outpt, electrolytes Renal dose meds  Might need transfer to [facility] for CRRT if he can be stablized  GASTROENTEROLOGY A: Rectal cancer s/p low anterior resection with diverting loop ileostomy with post-op course likely complicated by anastomotic leak and found to have multiple large abdominal abscesses s/p percutaneous drain by IR 7/23. Protein calorie malnutrition. Dysphagia. P:   Protonix for SUP Might need trip to OR for abdominal washing 7/24 if he can be stabilized Post-op care per [facility] ?if need to add TNA versus start tube feeds 7/25  HEME A: Anemia of critical illness and chronic disease. Thrombocytopenia related to critical illness and ETOH - stable. P:   F/u CBC Transfuse for Hb < 7 SQ heparin for DVT prevention SCDs  INFECTIOUS DISEASE PERC DRAIN 7/23>>> A: Severe sepsis/septic shock from intra-abdominal abscesses and peritonitis >> s/p US guided per cath for drainage 7/23 P:   Eratapenem started 7/17, day 8/x Vancomycin: , start date: 7/24 , day 0/X voriconazole 7/24, day 0/x  F/u abscess cx 7/23: abundant GNR & few GPR>>>  NEURO A: Hx of ETOH. Acute  encephalopathy: in setting of shock 7/23 P:   Thiamine, folic acid RASS goal: -2 to -3 Supportive care  Summary: .  Critically ill in refractory shock.. Concerned that second large abscess may be contributing. Now in renal failure and profound metabolic acidosis. Best case scenario is return to OR for  [place]... Not sure he can survive this but likely his only chance. Have discussed at length w/ family. His acid base and renal failure as well as shock make him NOT a OR candidate currently. If we can get his  PH > 7.2 AND we can control his BP then he could possibly go to the OR... He will likely need HD, but I do not think he is stable enough to transfer to [facility] for this... We will have to reassess this later in the afternoon. Have had a long discussion w/ wife and daughter. Given the continued deterioration they agree to DNR status should he arrest in this current situation   Erick Colace, NP [phone]  Reviewed above, examined, and documentation changes made as needed.  He has recurrent septic shock with MODS, AKI, respiratory failure, acidosis likely 2nd to intra-abdominal abscesses.  Abx and anti-fungals adjusted.  Will try to optimize medical status so he can go to OR for abdominal washing.  Might need to have renal evaluation and transfer to [facility] for CRRT if unable to improve acidosis and stabilize renal fx.  Prognosis is poor at this time.  Updated family about critical nature of his illness, and that he might not survive.  DNR status with continued medical care is appropriate.  Will discuss goals of care further depending on his progression.  CC time 90 minutes.  Chesley Mires, MD [facility] Pulmonary/Critical Care 12-06-13, 10:28 AM Pager:  [phone] After 3pm call: [phone]

## 2013-11-15 NOTE — Progress Notes (Signed)
ANTIBIOTIC CONSULT NOTE - INITIAL  Pharmacy Consult for Vancomycin, Voriconazole, Renal abx adjustment (Ertapenem) Indication: intra-abdominal infection  No Known Allergies  Patient Measurements: Height: 5' 11.5" (181.6 cm) Weight: 187 lb 2.7 oz (84.9 kg) IBW/kg (Calculated) : 76.45   Vital Signs: Temp: 96.3 F (35.7 C) (07/24 0800) Temp src: Core (Comment) (07/24 0800) Pulse Rate: 28 (07/24 0715) Intake/Output from previous day: 07/23 0701 - 07/24 0700 In: 9324 [I.V.:3926.5; Blood:347.5; IV Piggyback:5050] Out: 9937 [Urine:100; Drains:1285; Stool:150] Intake/Output from this shift: Total I/O In: 996.8 [I.V.:996.8] Out: -   Labs:  Recent Labs  11/05/13 0530 11/06/13 0420 11/06/13 1530 Dec 01, 2013 0530  WBC 16.3* 12.9* 11.2* 35.5*  HGB 7.5* 7.4* 9.0* 10.6*  PLT 87* 99* 108* 99*  CREATININE 0.98 0.84  --  2.15*   Estimated Creatinine Clearance: 31.6 ml/min (by C-G formula based on Cr of 2.15). No results found for this basename: VANCOTROUGH, Corlis Leak, VANCORANDOM, Hart, GENTPEAK, GENTRANDOM, TOBRATROUGH, TOBRAPEAK, TOBRARND, AMIKACINPEAK, AMIKACINTROU, AMIKACIN,  in the last 72 hours   Microbiology: Recent Results (from the past 720 hour(s))  MRSA PCR SCREENING     Status: None   Collection Time    10/31/13 10:18 PM      Result Value Ref Range Status   MRSA by PCR NEGATIVE  NEGATIVE Final   Comment:            The GeneXpert MRSA Assay (FDA     approved for NASAL specimens     only), is one component of a     comprehensive MRSA colonization     surveillance program. It is not     intended to diagnose MRSA     infection nor to guide or     monitor treatment for     MRSA infections.  CULTURE, BLOOD (SINGLE)     Status: None   Collection Time    10/31/13 11:23 PM      Result Value Ref Range Status   Specimen Description BLOOD RIGHT ANTECUBITAL   Final   Special Requests BOTTLES DRAWN AEROBIC AND ANAEROBIC 10ML EACH   Final   Culture  Setup Time      Final   Value: 11/01/2013 03:31     Performed at [facility]   Culture     Final   Value: NO GROWTH 5 DAYS     Performed at [facility]   Report Status Dec 01, 2013 FINAL   Final  CULTURE, BLOOD (SINGLE)     Status: None   Collection Time    10/31/13 11:28 PM      Result Value Ref Range Status   Specimen Description BLOOD R HAND   Final   Special Requests BOTTLES DRAWN AEROBIC ONLY 2ML   Final   Culture  Setup Time     Final   Value: 11/01/2013 03:30     Performed at [facility]   Culture     Final   Value: NO GROWTH 5 DAYS     Performed at [facility]   Report Status 12/01/13 FINAL   Final  CULTURE, ROUTINE-ABSCESS     Status: None   Collection Time    11/06/13 11:29 PM      Result Value Ref Range Status   Specimen Description PERITONEAL CAVITY   Final   Special Requests NONE   Final   Gram Stain     Final   Value: ABUNDANT WBC PRESENT,BOTH PMN AND MONONUCLEAR     NO SQUAMOUS EPITHELIAL  CELLS SEEN     ABUNDANT GRAM NEGATIVE RODS     FEW GRAM POSITIVE RODS     Performed at [facility]   Culture PENDING   Incomplete   Report Status PENDING   Incomplete    Medical History: Past Medical History  Diagnosis Date  . Rectal adenocarcinoma (7cm posterior) 06/16/2013  . Kyphoscoliosis deformity of spine   . History of radiation therapy 07/14/13-08/20/13    rectal, 50.4Gy/  . Psoriasis     SCALP  . Hemorrhoids   . History of transfusion   . Anemia of chronic disease 10/27/2013    Anti-infectives:  7/14 >> Cefotetan >> 7/14 (peri-op) 7/17 >> Ertapenem >>   7/24 >> Vanc >> 7/24 >> Voriconazole >>   Assessment: 78 yoM with history of rectal cancer was admitted on 7/14 for removal of rectal mass.  On 7/17 he developed hypotension, tachypnea and Ertapenem was started for anastomotic leak.  On 7/24 he is in refractory shock, pharmacy is consulted to renally dose ertapenem and add vancomycin and voriconazole for intra-abdominal  abscess.  7/24: Day #8 Ertapenem, D#1 Vancomycin and Voriconazole.  Tmax: 100.2  WBCs: 35.5 (up from 11.2)  Renal: SCr 2.15 (up from 0.84) with CrCl ~ 31 ml/min.  Lactic acid: 9.9 (up from 1.9)   Goal of Therapy:  Vancomycin trough level 15-20 mcg/ml  Plan:   Continue ertapenem 1g IV q24h  Voriconazole 500mg  (~6 mg/kg) q12h x 2 doses, then 300mg  (~3.5 mg/kg) IV q12h  Vancomycin 1g IV q24h.  Measure Vanc trough at steady state.  Follow up renal fxn and culture results.  Gretta Arab PharmD, BCPS Pager [phone] 12-02-2013 9:58 AM

## 2013-11-15 DEATH — deceased

## 2013-11-17 NOTE — Accreditation Note (Signed)
Pursuant to regulation 482.13 (G) (3) use of soft wrist restraints was logged on November 17, 2013 at [phone] by Arta Bruce. Richard [name], BSN, Insurance account manager.

## 2013-11-17 NOTE — Discharge Summary (Signed)
Physician Discharge Summary  Patient ID: Richard [name] MRN: [id number] DOB/AGE: 1934-06-11 79 y.o.  Admit date: 10/30/2013 Discharge date: 11/17/2013  Admission Diagnoses:  Discharge Diagnoses:  Principal Problem:   Rectal adenocarcinoma s/p robotic LAR Lucianne Lei 11/08/2013 Active Problems:   Tobacco abuse   Anemia of chronic disease   Severe sepsis   Acute kidney injury   Acute encephalopathy   COPD exacerbation   Acute respiratory failure   Discharged Condition: deceased  Hospital Course: The patient underwent a robotic lower anterior resection. In the early postoperative period he did show signs of sepsis. He was treated with antibiotics and had a fairly rapid turnaround. Over the next several days he significantly improved. He eventually looked so good that I had to transfer him out of the ICU. Within a couple hours of transferring out of the ICU he came back to the ICU with decreased mental status and hypotension. He was intubated and resuscitated and taken to radiology for a CT scan of his abdomen and pelvis. This revealed 2 large abscesses in his abdomen that were not being drained by his current drain. One of these was able to be percutaneously drained at the bedside. At this point we wanted to take him to the operating room to open his abdomen and washing out but he was too unstable and with his worsening renal failure his potassium and acidosis levels would not allow [place] to take him to the operating room. He was transferred to [facility] to attempt CVVHD But he expired and we were never able to get him to the operating room.  Consults: pulmonary/intensive care and nephrology  Significant Diagnostic Studies: As above  Treatments: As above  Discharge Exam: Blood pressure 110/42, pulse 30, temperature 92.3 F (33.5 C), temperature source Core (Comment), resp. rate 0, height 5' 11.5" (1.816 m), weight 187 lb 2.7 oz (84.9 kg), SpO2 98.00%. Resp: rhonchi  bilaterally Cardio: tachycardic and hypotensive GI: distended and quiet. Ostomy was beginning to look dusky  Disposition: 20-Expired  Discharge Instructions   Call MD for:  extreme fatigue    Complete by:  As directed      Call MD for:  hives    Complete by:  As directed      Call MD for:  persistant nausea and vomiting    Complete by:  As directed      Call MD for:  redness, tenderness, or signs of infection (pain, swelling, redness, odor or green/yellow discharge around incision site)    Complete by:  As directed      Call MD for:  severe uncontrolled pain    Complete by:  As directed      Call MD for:    Complete by:  As directed   Temperature > 101.69F     Diet - low sodium heart healthy    Complete by:  As directed      Discharge instructions    Complete by:  As directed   Please see discharge instruction sheets.  Also refer to handout given an office.  Please call our office if you have any questions or concerns [phone]     Discharge wound care:    Complete by:  As directed   If you have closed incisions, shower and bathe over these incisions with soap and water every day.  Remove all surgical dressings on postoperative day #3.  You do not need to replace dressings over the closed incisions unless you feel more comfortable with  a Band-Aid covering it.   If you have an open wound that requires packing, please see wound care instructions.  In general, remove all dressings, wash wound with soap and water and then replace with saline moistened gauze.  Do the dressing change at least every day.  Please call our office [phone] if you have further questions.     Driving Restrictions    Complete by:  As directed   No driving until off narcotics and can safely swerve away without pain during an emergency     Increase activity slowly    Complete by:  As directed   Walk an hour a day.  Use 20-30 minute walks.  When you can walk 30 minutes without difficulty, increase to low  impact/moderate activities such as biking, jogging, swimming, sexual activity..  Eventually can increase to unrestricted activity when not feeling pain.  If you feel pain: STOP!Marland Kitchen   Let pain protect you from overdoing it.  Use ice/heat/over-the-counter pain medications to help minimize his soreness.  Use pain prescriptions as needed to remain active.  It is better to take extra pain medications and be more active than to stay bedridden to avoid all pain medications.     Lifting restrictions    Complete by:  As directed   Avoid heavy lifting initially.  Do not push through pain.  You have no specific weight limit.  Coughing and sneezing or four more stressful to your incision than any lifting you will do. Pain will protect you from injury.  Therefore, avoid intense activity until off all narcotic pain medications.  Coughing and sneezing or four more stressful to your incision than any lifting he will do.     May shower / Bathe    Complete by:  As directed      May walk up steps    Complete by:  As directed      Sexual Activity Restrictions    Complete by:  As directed   Sexual activity as tolerated.  Do not push through pain.  Pain will protect you from injury.     Walk with assistance    Complete by:  As directed   Walk over an hour a day.  May use a walker/cane/companion to help with balance and stamina.            Medication List         multivitamin tablet  Take 1 tablet by mouth daily.     oxyCODONE 5 MG immediate release tablet  Commonly known as:  Oxy IR/ROXICODONE  Take 1-2 tablets (5-10 mg total) by mouth every 4 (four) hours as needed for moderate pain, severe pain or breakthrough pain.           Follow-up Information   Follow up with GROSS,STEVEN C., MD In 2 weeks. (To follow up after your operation, To follow up after your hospital stay)    Specialty:  General Surgery   Contact information:   [place] [address] [phone]        Follow up with [facility]. (home health physical therapy and nurse for IV administration and IV site care)    Contact information:   [address] [place] [address] [phone]       Signed: Merrie Roof 11/17/2013, 11:23 AM

## 2014-04-30 ENCOUNTER — Encounter (HOSPITAL_COMMUNITY): Payer: Self-pay | Admitting: General Surgery

## 2016-02-04 IMAGING — US US ABSCESS DRAINAGE W/ CATHETER
1 series · 10 of 10 positions shown · non-contrast
Comparison: none

CLINICAL DATA: 78-year-old with postoperative abdominal fluid
collections and concern for abscess collections.

[Series 1: us abscess drainage w/ catheter · 0.24mm/px · 10 of 10 slices shown]
[im 1/10]
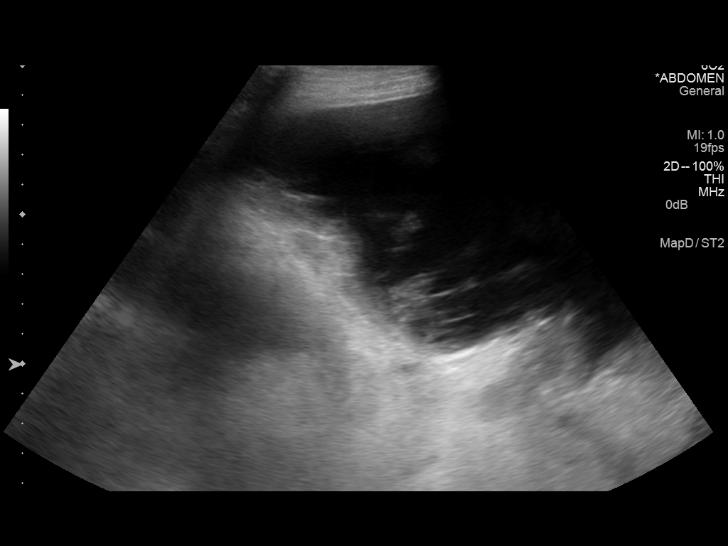
[im 2/10]
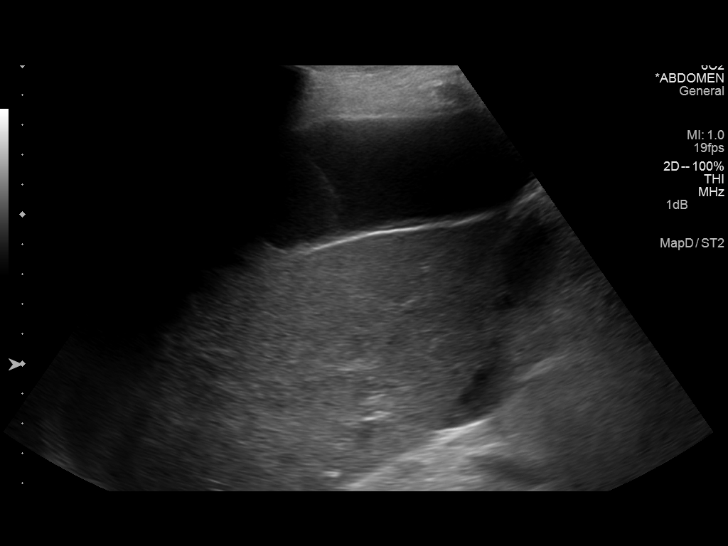
[im 3/10]
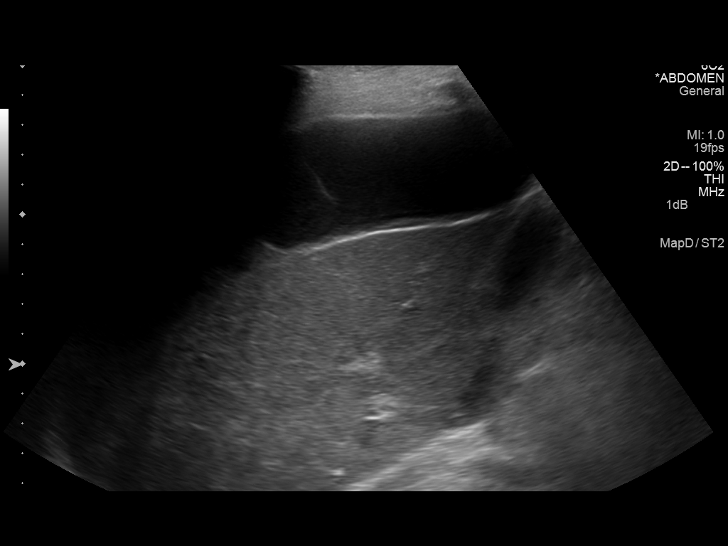
[im 4/10]
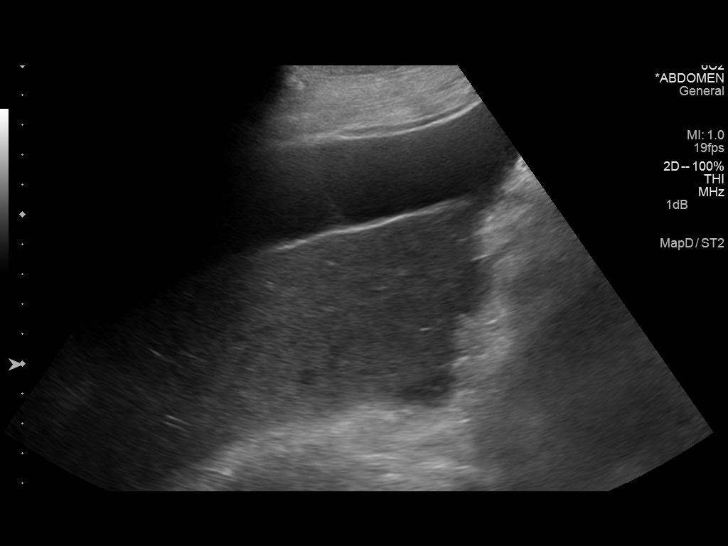
[im 5/10]
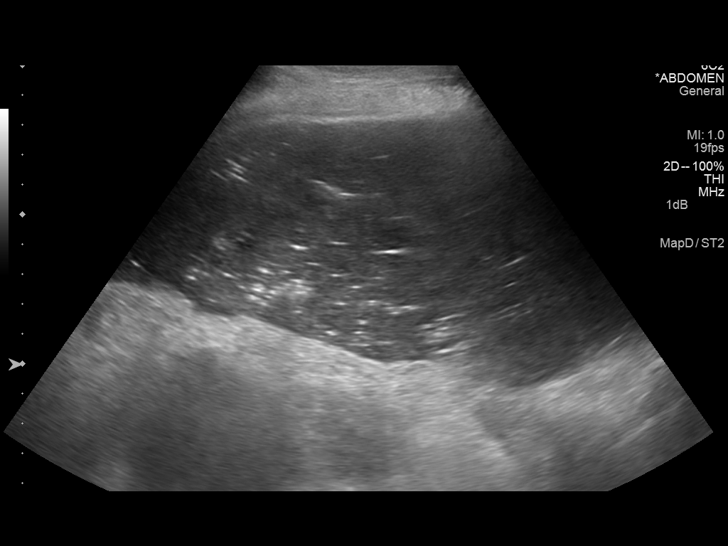
[im 6/10]
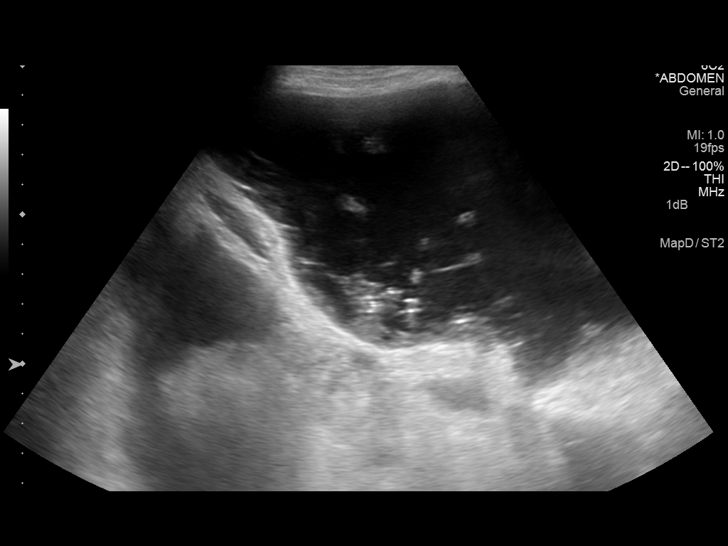
[im 7/10]
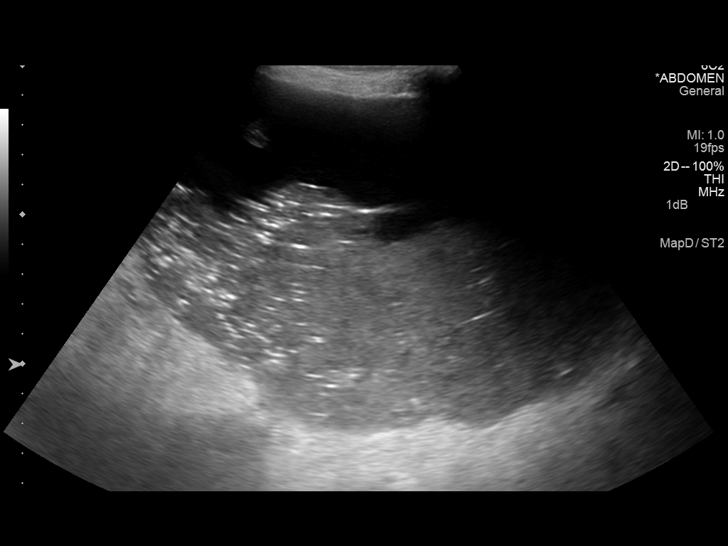
[im 8/10]
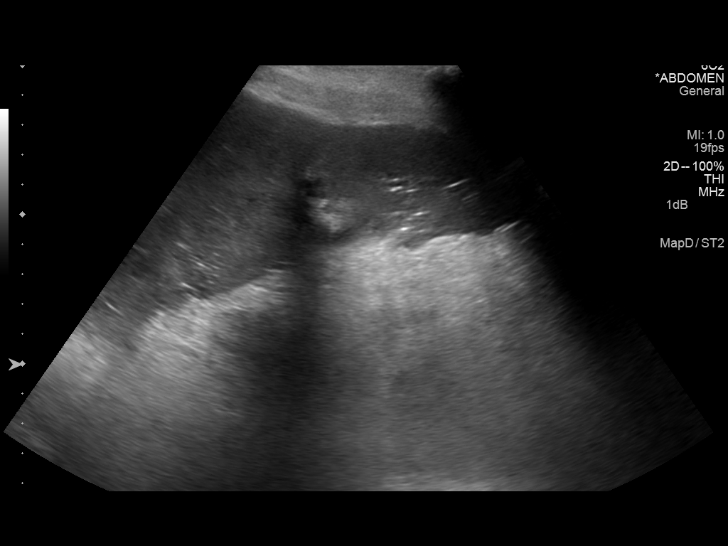
[im 9/10]
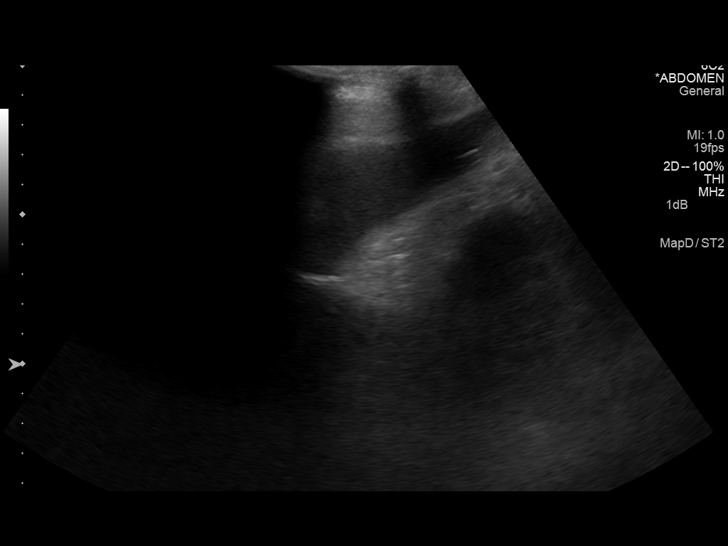
[im 10/10]
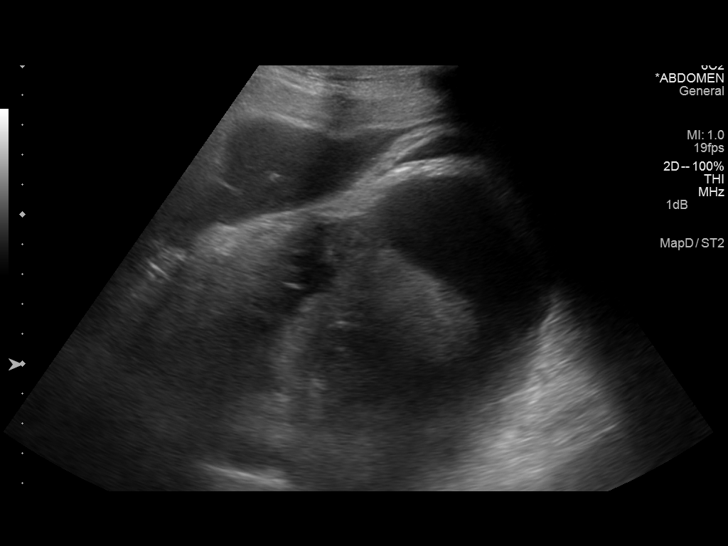

[10 of 10 positions shown; findings below may reference images not displayed]

EXAM:
ULTRASOUND GUIDED PLACEMENT OF AN ABDOMINAL ABSCESS DRAIN

FLUOROSCOPY TIME:  None

MEDICATIONS:
None

ANESTHESIA/SEDATION:
Patient was monitored by the intensive care unit nursing staff
throughout the procedure.

PROCEDURE:
Informed consent was obtained for an image guided drainage by the
patient's wife. Ultrasound demonstrated a large complex fluid
collection in the left lower abdomen. The left lower abdomen was
prepped with Betadine and a sterile field was created. Skin was
anesthetized 1% lidocaine. A Yueh catheter was directed to this
fluid collection and thick yellow purulent fluid was aspirated. A
stiff Amplatz wire was placed. Initially, a 10.2 French multipurpose
drain was placed. However, only small amount of fluid could be
removed because the fluid was so thick. This drain was removed over
the Amplatz wire and exchanged for a 14 French multipurpose drain.
Greater than 1 liter of yellowish brown thick purulent fluid was
removed. Majority of the fluid collection was aspirated. Catheter
was sutured to the skin and attached to a suction bulb. Fluid sample
sent for culture.
FINDINGS: Complex fluid collection in the left lower abdomen with multiple
echoes. Findings consistent with an abscess collection. Markedly
decreased fluid in the left lower abdomen following aspiration of
the purulent fluid. Large amount of fluid around the liver with a
few septations.

COMPLICATIONS:
None
IMPRESSION: Ultrasound-guided drain placement in the left lower abdominal
abscess. Greater than 1 liter of purulent fluid was removed.

## 2016-02-04 IMAGING — CT CT ABD-PELV W/O CM
2 of 4 series · 15 of 46 positions shown, 17 images · IV contrast (omnipaque)
Comparison: None.

Prior CT from 06/20/2013

CLINICAL DATA: Suspected abdominal sepsis. Status post robotic
laparoscopic partial colectomy and rectum with BILYA and diverting
loop ileostomy.

EXAM:
CT CHEST WITH CONTRAST.
CT ABDOMEN, AND PELVIS WITHOUT CONTRAST
TECHNIQUE: Multidetector CT imaging of the chest, abdomen and pelvis was
performed following the standard protocol during bolus
administration of intravenous contrast.
CONTRAST:  100mL OMNIPAQUE IOHEXOL 350 MG/ML SOLN

[Series 2: rtn a/p w/o · axial · non-contrast · 0.86mm/px · z∈[-620,-180]mm · 12 of 102 slices shown, 14 images]
[im 9/102  soft-tissue]
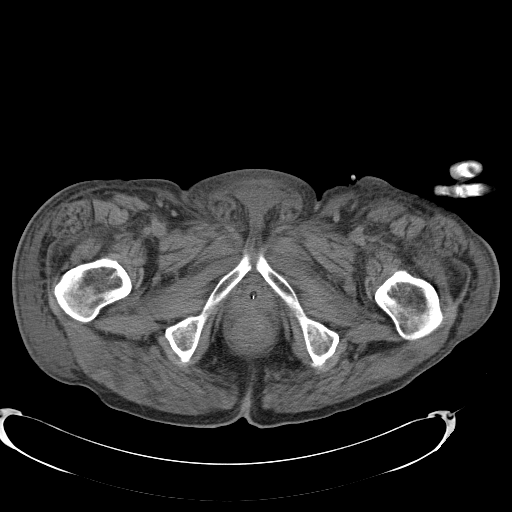
[im 9/102  bone]
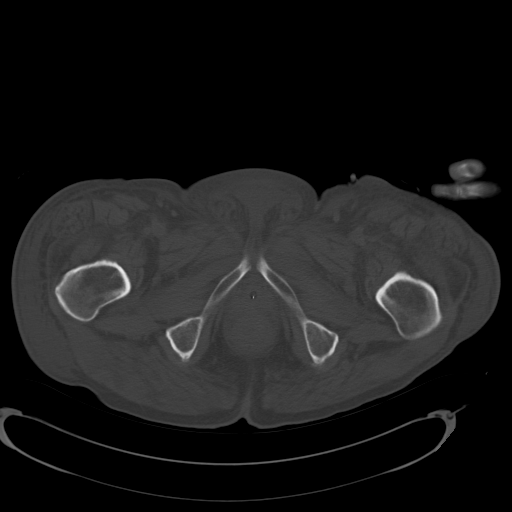
[im 17/102  soft-tissue]
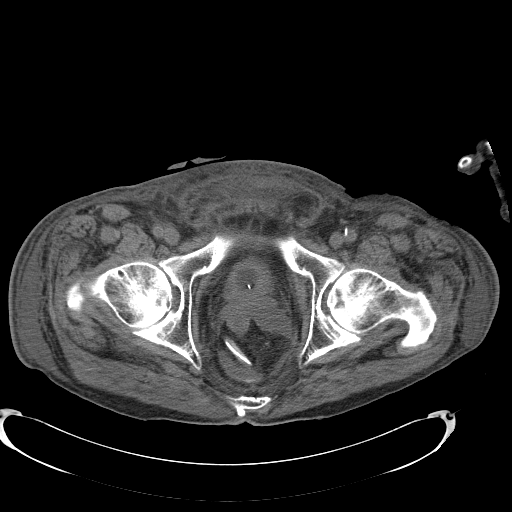
[im 25/102  soft-tissue]
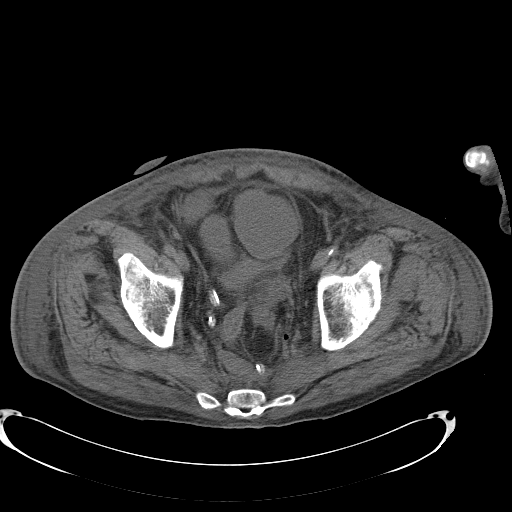
[im 33/102  soft-tissue]
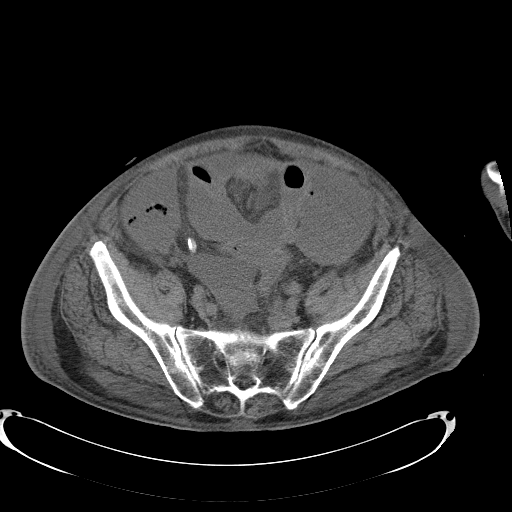
[im 41/102  soft-tissue]
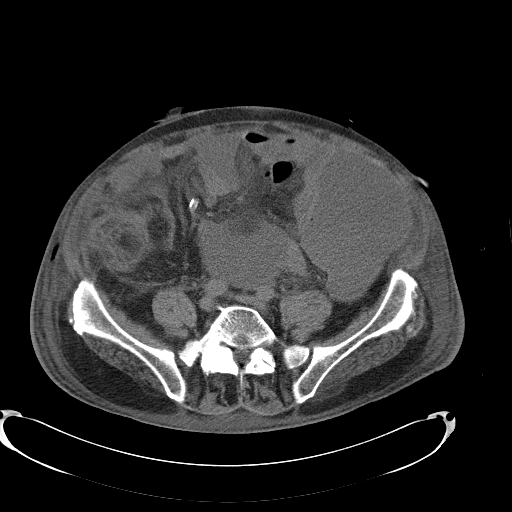
[im 49/102  soft-tissue]
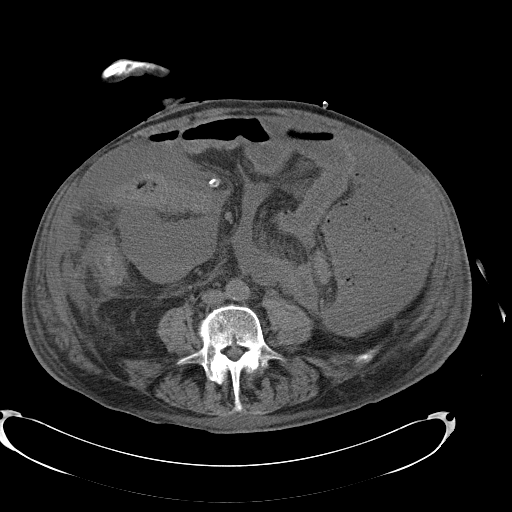
[im 57/102  soft-tissue]
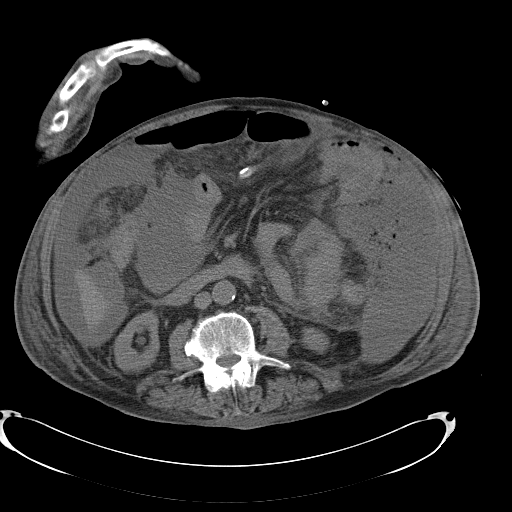
[im 65/102  soft-tissue]
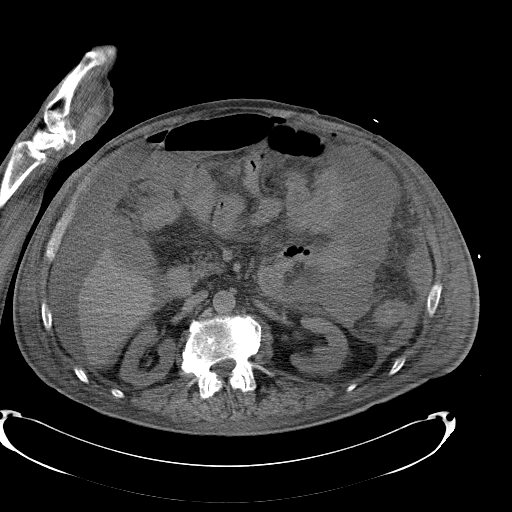
[im 73/102  soft-tissue]
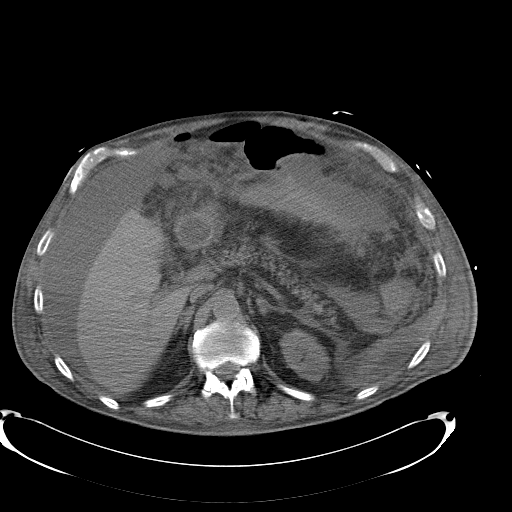
[im 73/102  bone]
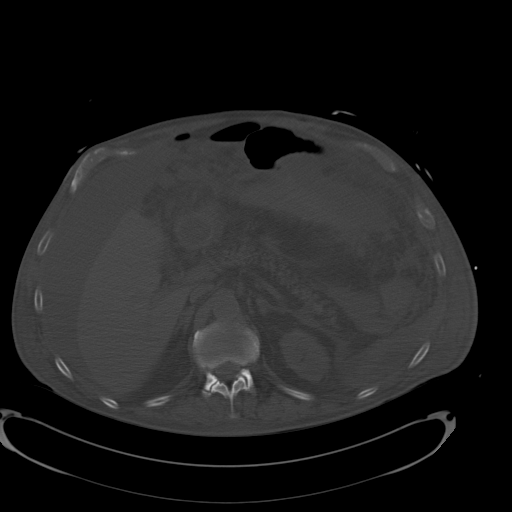
[im 81/102  soft-tissue]
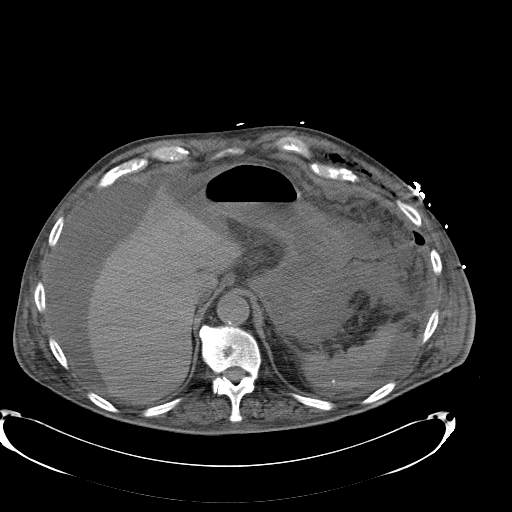
[im 89/102  soft-tissue]
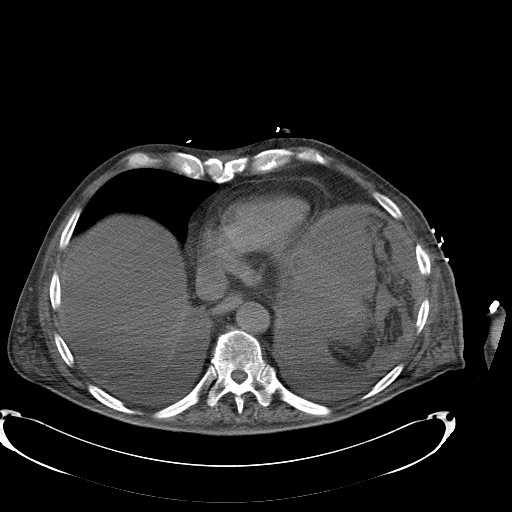
[im 97/102  soft-tissue]
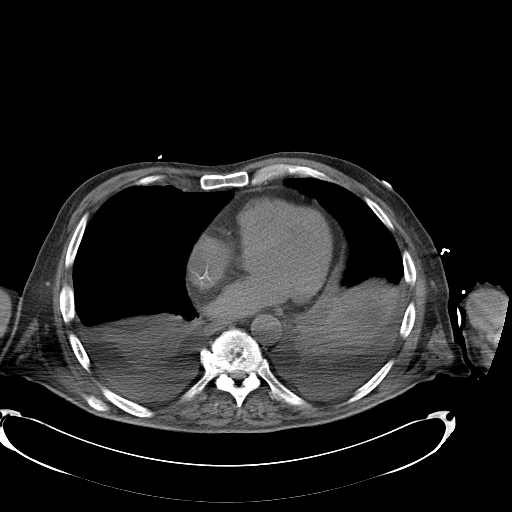

[Series 602: <mpr thick range> · coronal · 1.00mm/px · 3 of 145 slices shown]
[im 49/145  soft-tissue]
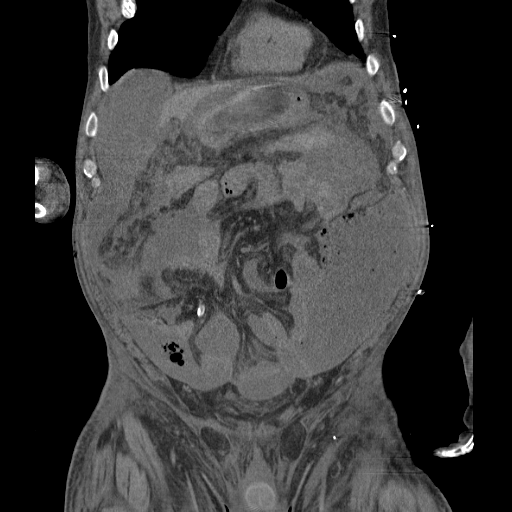
[im 65/145  soft-tissue]
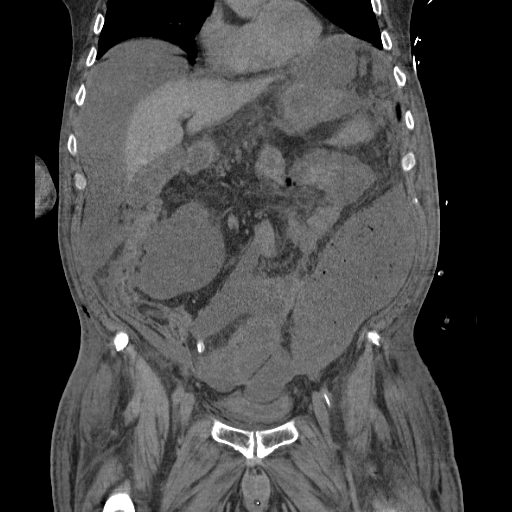
[im 81/145  soft-tissue]
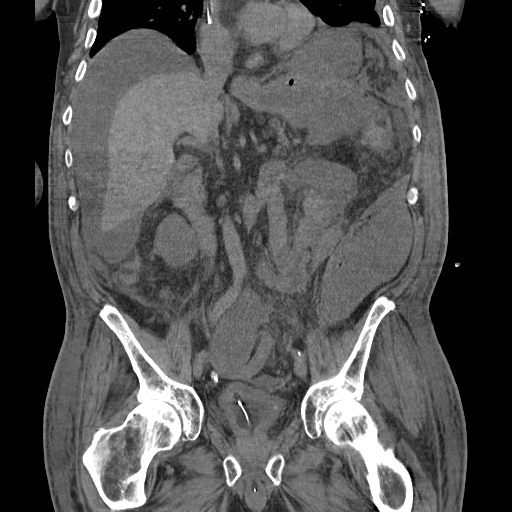

[15 of 46 positions shown; findings below may reference images not displayed]

FINDINGS: CT CHEST FINDINGS

Endotracheal tube in place with tip just above the carina. No
pathologically enlarged mediastinal, hilar, or axillary lymph nodes
are identified.

Left-sided central venous catheter partially visualized.
Intrathoracic aorta is of normal caliber and appearance. Great
vessels within normal limits.

Heart size within normal limits.  No pericardial effusion.

Pulmonary arterial tree is well opacified. No filling defect to
suggest acute pulmonary embolism identified. Re-formatted imaging
confirms these findings.

Moderate a large layering bilateral pleural effusions are present
with associated atelectasis. Aerated portions of the lungs are clear
without focal infiltrate. No pneumothorax. Centrilobular
emphysematous changes noted within the bilateral upper lobes.

No acute osseous abnormality. No worrisome lytic or blastic osseous
lesions.

CT ABDOMEN AND PELVIS FINDINGS

Limited noncontrast evaluation of the liver is unremarkable.
Gallbladder within normal limits. No biliary ductal dilatation.
Spleen within normal limits. Adrenal glands and pancreas demonstrate
a normal unenhanced appearance.

Kidneys are equal in size. No nephrolithiasis or hydronephrosis. No
hydroureter bilaterally.

Stomach within normal limits. Patient is status post partial
colectomy with a right lower quadrant ileostomy seen. Percutaneous
surgical drain extending via the right anterior abdomen in place
with tip terminating in the lower pelvis.

A large loculated hypodense collection measuring 20.0 bite 18.4 x
10.0 cm seen within the left abdomen (series 2, image 49). Scattered
foci of gas seen within this collection. Finding is most consistent
with intra-abdominal abscess.

Additional loculated collection measuring 6.8 x 5.0 x 10.6 cm seen
within the right lower quadrant (series 2, image 71). The
percutaneous drain does not traverse either of these collections.
Moderate volume free fluid present within the abdomen and pelvis.
Few scattered foci of free intraperitoneal air are seen, which may
be related to recent surgery.

Bladder is largely decompressed with a Foley catheter in place.
Prostate grossly normal.

Diffuse anasarca present.

No acute osseous abnormality. No new worrisome lytic or blastic
osseous lesions. Sclerotic focus within the L2 vertebral body is
unchanged. Lucency within the L4 vertebral body also stable.
IMPRESSION: CT CHEST:

1. No CT evidence of acute pulmonary embolism.
2. Moderate to large layering bilateral pleural effusions with
associated atelectasis.
3. Upper lobe predominant centrilobular emphysema.
4. Anasarca.

CT ABDOMEN AND PELVIS:

1. Large loculated collection with internal gas measuring 20.0 x
18.4 x 10.0 cm within the left abdomen, most consistent with
abscess.
2. Second loculated collection with internal gas measuring 6.8 x
x 10.6 cm within the right lower quadrant, also most consistent with
abscess.
3. Scattered foci of free intraperitoneal air. While this finding
may be partly related to recent surgery, given the presence of the
large abscess sees, possible bowel leak should be considered.
4. Sequelae of prior partial colectomy with low anterior resection
and diverting right lower quadrant ileostomy. No evidence of
obstruction.
5. Moderate volume ascites.
6. Percutaneous surgical drain in place. This does not traverse
either of the loculated collections.
7. Diffuse anasarca.
Critical Value/emergent results were called by telephone at the time
of interpretation on 11/06/2013 at [DATE] to Dr. Tiara Peoples who
verbally acknowledged these results.

## 2016-02-04 IMAGING — DX DG CHEST 1V PORT
1 series · 1 of 1 positions shown · non-contrast
Comparison: 11/06/2013.

CLINICAL DATA: Intubation.

EXAM:
PORTABLE CHEST - 1 VIEW

[chest ap]
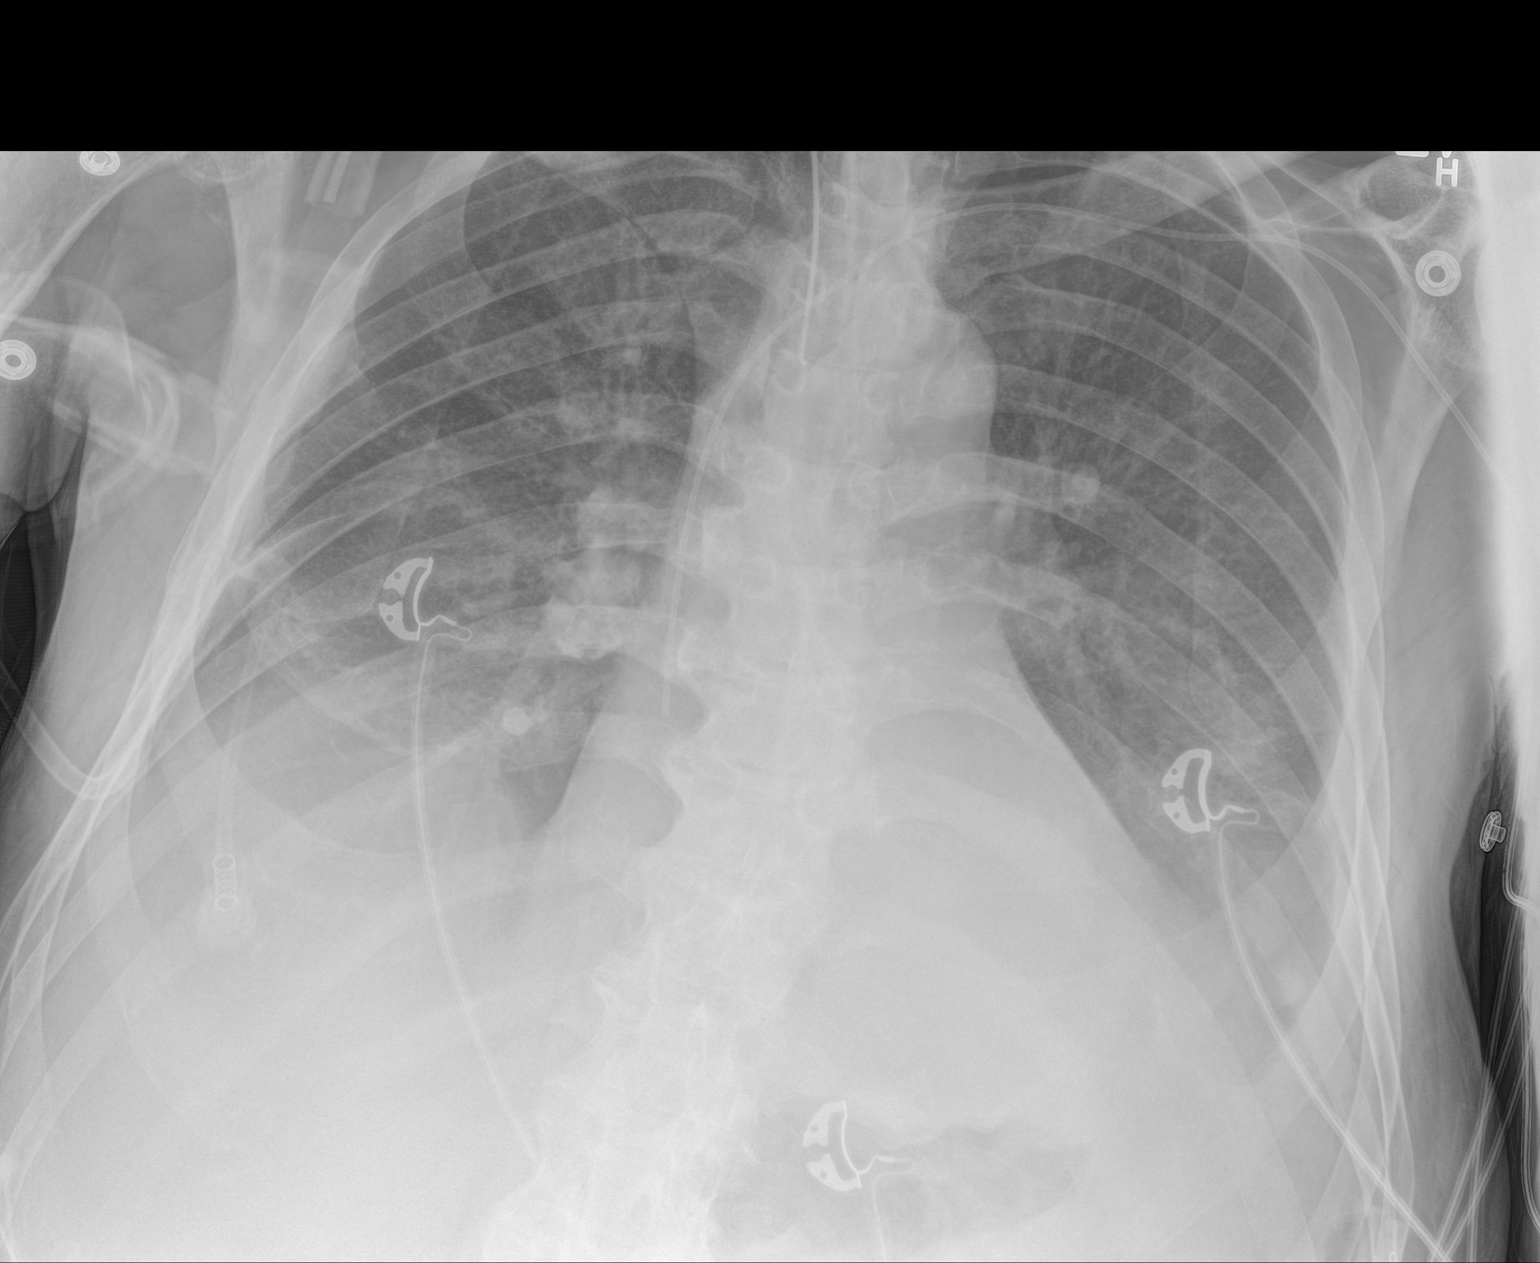

[1 of 1 positions shown; findings below may reference images not displayed]

FINDINGS: Endotracheal tube and PICC line in good anatomic position.
Endotracheal tube tip is 2.9 cm above the carina. Cardiomegaly with
bilateral interstitial prominence and pleural effusion noted
consistent with congestive heart failure. No acute bony abnormality
identified.
IMPRESSION: 1. Endotracheal tube and PICC line in good anatomic position.
2. Congestive heart failure with bilateral pulmonary edema and small
pleural effusions.
